# Patient Record
Sex: Female | Born: 1945 | Race: Black or African American | Hispanic: No | Smoking: Never smoker
Health system: Southern US, Community
[De-identification: ages and names within clinical notes are randomized; demographics above are authoritative.]

## PROBLEM LIST (undated history)

## (undated) DIAGNOSIS — I10 Essential (primary) hypertension: Secondary | ICD-10-CM

## (undated) DIAGNOSIS — G709 Myoneural disorder, unspecified: Secondary | ICD-10-CM

## (undated) DIAGNOSIS — I639 Cerebral infarction, unspecified: Secondary | ICD-10-CM

## (undated) DIAGNOSIS — E785 Hyperlipidemia, unspecified: Secondary | ICD-10-CM

## (undated) DIAGNOSIS — Z5189 Encounter for other specified aftercare: Secondary | ICD-10-CM

## (undated) DIAGNOSIS — E119 Type 2 diabetes mellitus without complications: Secondary | ICD-10-CM

## (undated) DIAGNOSIS — R269 Unspecified abnormalities of gait and mobility: Secondary | ICD-10-CM

## (undated) HISTORY — DX: Hyperlipidemia, unspecified: E78.5

## (undated) HISTORY — PX: ABDOMINAL HYSTERECTOMY: SHX81

## (undated) HISTORY — DX: Unspecified abnormalities of gait and mobility: R26.9

## (undated) HISTORY — DX: Type 2 diabetes mellitus without complications: E11.9

## (undated) HISTORY — DX: Essential (primary) hypertension: I10

## (undated) HISTORY — DX: Cerebral infarction, unspecified: I63.9

## (undated) HISTORY — DX: Encounter for other specified aftercare: Z51.89

## (undated) HISTORY — PX: OTHER SURGICAL HISTORY: SHX169

## (undated) HISTORY — DX: Myoneural disorder, unspecified: G70.9

---

## 2016-02-01 DIAGNOSIS — I10 Essential (primary) hypertension: Secondary | ICD-10-CM | POA: Diagnosis not present

## 2016-02-24 DIAGNOSIS — I10 Essential (primary) hypertension: Secondary | ICD-10-CM | POA: Diagnosis not present

## 2016-04-18 DIAGNOSIS — Z79899 Other long term (current) drug therapy: Secondary | ICD-10-CM | POA: Diagnosis not present

## 2016-04-18 DIAGNOSIS — R269 Unspecified abnormalities of gait and mobility: Secondary | ICD-10-CM | POA: Diagnosis not present

## 2016-04-18 DIAGNOSIS — R479 Unspecified speech disturbances: Secondary | ICD-10-CM | POA: Diagnosis not present

## 2016-04-21 DIAGNOSIS — G9389 Other specified disorders of brain: Secondary | ICD-10-CM | POA: Diagnosis not present

## 2016-04-25 DIAGNOSIS — I10 Essential (primary) hypertension: Secondary | ICD-10-CM | POA: Diagnosis not present

## 2016-04-25 DIAGNOSIS — R7309 Other abnormal glucose: Secondary | ICD-10-CM | POA: Diagnosis not present

## 2016-04-25 DIAGNOSIS — R269 Unspecified abnormalities of gait and mobility: Secondary | ICD-10-CM | POA: Diagnosis not present

## 2016-06-22 DIAGNOSIS — M5135 Other intervertebral disc degeneration, thoracolumbar region: Secondary | ICD-10-CM | POA: Diagnosis not present

## 2016-06-22 DIAGNOSIS — R9082 White matter disease, unspecified: Secondary | ICD-10-CM | POA: Diagnosis not present

## 2016-06-22 DIAGNOSIS — M5031 Other cervical disc degeneration,  high cervical region: Secondary | ICD-10-CM | POA: Diagnosis not present

## 2016-06-22 DIAGNOSIS — I10 Essential (primary) hypertension: Secondary | ICD-10-CM | POA: Diagnosis not present

## 2016-06-22 DIAGNOSIS — M542 Cervicalgia: Secondary | ICD-10-CM | POA: Diagnosis not present

## 2016-06-22 DIAGNOSIS — M47819 Spondylosis without myelopathy or radiculopathy, site unspecified: Secondary | ICD-10-CM | POA: Diagnosis not present

## 2017-03-31 ENCOUNTER — Encounter (HOSPITAL_COMMUNITY): Payer: Self-pay | Admitting: Emergency Medicine

## 2017-03-31 ENCOUNTER — Ambulatory Visit (HOSPITAL_COMMUNITY)
Admission: EM | Admit: 2017-03-31 | Discharge: 2017-03-31 | Disposition: A | Discharge status: Home / Self Care | Payer: Medicare Other | Attending: Internal Medicine | Admitting: Internal Medicine

## 2017-03-31 DIAGNOSIS — R5381 Other malaise: Secondary | ICD-10-CM | POA: Diagnosis not present

## 2017-03-31 DIAGNOSIS — I10 Essential (primary) hypertension: Secondary | ICD-10-CM | POA: Diagnosis not present

## 2017-03-31 DIAGNOSIS — R0789 Other chest pain: Secondary | ICD-10-CM | POA: Diagnosis not present

## 2017-03-31 DIAGNOSIS — R5383 Other fatigue: Secondary | ICD-10-CM | POA: Diagnosis not present

## 2017-03-31 LAB — POCT I-STAT, CHEM 8
BUN: 22 mg/dL — AB (ref 6–20)
CREATININE: 1 mg/dL (ref 0.44–1.00)
Calcium, Ion: 1.21 mmol/L (ref 1.15–1.40)
Chloride: 104 mmol/L (ref 101–111)
GLUCOSE: 132 mg/dL — AB (ref 65–99)
HEMATOCRIT: 43 % (ref 36.0–46.0)
Hemoglobin: 14.6 g/dL (ref 12.0–15.0)
Potassium: 3.7 mmol/L (ref 3.5–5.1)
Sodium: 140 mmol/L (ref 135–145)
TCO2: 29 mmol/L (ref 0–100)

## 2017-03-31 LAB — POCT URINALYSIS DIP (DEVICE)
Bilirubin Urine: NEGATIVE
GLUCOSE, UA: NEGATIVE mg/dL
HGB URINE DIPSTICK: NEGATIVE
KETONES UR: NEGATIVE mg/dL
Leukocytes, UA: NEGATIVE
Nitrite: NEGATIVE
PROTEIN: NEGATIVE mg/dL
SPECIFIC GRAVITY, URINE: 1.025 (ref 1.005–1.030)
Urobilinogen, UA: 0.2 mg/dL (ref 0.0–1.0)
pH: 6.5 (ref 5.0–8.0)

## 2017-03-31 NOTE — Discharge Instructions (Signed)
You have very high uncontrolled blood pressure which can cause damage to the heart, kidney and eyes. Recommend that you consider medications to help lower your pressure and prevent damage. This could be a factor in your weakness, but without additional symptoms it is unclear. Your urine, blood work and EKG here are stable, but remember could only perform very limited studies. If you begin to have worsening symptoms or emergent needs then please go to the ED.

## 2017-03-31 NOTE — ED Triage Notes (Signed)
Pt c/o feeling weak onset 1 week associated w/fatigue  Believes sx might be from a vaccination she rec'd 1 year ago for yellow fever  Denies fevers, chills  A&O x4... NAD... Ambulatory

## 2017-03-31 NOTE — ED Provider Notes (Signed)
CSN: [id number]     Arrival date & time 03/31/17  1337 History   None    Chief Complaint  Patient presents with  . Weakness  . Hypertension   (Consider location/radiation/quality/duration/timing/severity/associated sxs/prior Treatment)  70 yo female with a known history of HTN and non-compliance with medications presents with generalized weakness. This has been present for a week now. She reports a year of weakness but worse over the last week. She does not follow with a internist on a regular basis and is trying to find one. She states that she does not have energy to do anything at home. See full ROS for details.       History reviewed. No pertinent past medical history. History reviewed. No pertinent surgical history. History reviewed. No pertinent family history. Social History  Substance Use Topics  . Smoking status: Never Smoker  . Smokeless tobacco: Never Used  . Alcohol use No   OB History    No data available     Review of Systems  Constitutional: Positive for appetite change and fatigue. Negative for fever.  Respiratory: Negative for apnea, cough, shortness of breath and wheezing.   Cardiovascular: Positive for chest pain. Negative for palpitations and leg swelling.  Gastrointestinal: Negative.   Genitourinary: Negative.  Negative for dysuria and frequency.  Musculoskeletal: Negative.  Negative for back pain.  Neurological: Negative.   Psychiatric/Behavioral: Negative.  Negative for self-injury and sleep disturbance.    Allergies  Patient has no known allergies.  Home Medications   Prior to Admission medications   Not on File   Meds Ordered and Administered this Visit  Medications - No data to display  BP (!) 186/96 (BP Location: Left Arm)   Pulse 86   Temp 98.6 F (37 C) (Oral)   Resp 20   SpO2 99%  No data found.   Physical Exam  Constitutional: She is oriented to person, place, and time. She appears well-developed and well-nourished.  HENT:   Mouth/Throat: Oropharynx is clear and moist.  Neck: Normal range of motion.  No audible bruits  Cardiovascular: Normal rate and regular rhythm.   Pulmonary/Chest: Effort normal and breath sounds normal. No respiratory distress. She has no wheezes. She exhibits no tenderness.  Abdominal: Soft. Bowel sounds are normal. She exhibits no distension. There is no guarding.  Lymphadenopathy:    She has no cervical adenopathy.  Neurological: She is alert and oriented to person, place, and time. Coordination normal.  Skin: Skin is warm and dry. No rash noted.  Psychiatric: Her behavior is normal.  Nursing note and vitals reviewed.   Urgent Care Course     Procedures (including critical care time)  Labs Review Labs Reviewed  POCT I-STAT, CHEM 8 - Abnormal; Notable for the following:       Result Value   BUN 22 (*)    Glucose, Bld 132 (*)    All other components within normal limits  POCT URINALYSIS DIP (DEVICE)    Imaging Review No results found.   Visual Acuity Review  Right Eye Distance:   Left Eye Distance:   Bilateral Distance:    Right Eye Near:   Left Eye Near:    Bilateral Near:         MDM   1. Essential hypertension   2. Malaise and fatigue   71 yo with uncontrolled HTN and non-compliance to medical adherence presents with fatigue. She is otherwise asymptomatic. Baseline exam is normal. Urine and I-stat unremarkable and EKG with  possible high voltage and LVH but no emergent findings. Discussed consequences of uncontrolled HTN and she refuses to take any type of medications at this time. No emergent findings today other than elevated BP, will discharge. She is aware that her BP is elevated and could result in symptoms. If this becomes the case she is instructed to go to the ED for further management. She and her daughter express understanding.     Bjorn Pippin, [place] 03/31/17 1702

## 2017-07-17 ENCOUNTER — Encounter (HOSPITAL_COMMUNITY): Payer: Self-pay | Admitting: Emergency Medicine

## 2017-07-17 ENCOUNTER — Ambulatory Visit (HOSPITAL_COMMUNITY)
Admission: EM | Admit: 2017-07-17 | Discharge: 2017-07-17 | Disposition: A | Discharge status: Home / Self Care | Payer: Medicare Other | Attending: Family Medicine | Admitting: Family Medicine

## 2017-07-17 DIAGNOSIS — R531 Weakness: Secondary | ICD-10-CM

## 2017-07-17 DIAGNOSIS — R32 Unspecified urinary incontinence: Secondary | ICD-10-CM

## 2017-07-17 LAB — POCT URINALYSIS DIP (DEVICE)
BILIRUBIN URINE: NEGATIVE
Glucose, UA: NEGATIVE mg/dL
HGB URINE DIPSTICK: NEGATIVE
Ketones, ur: NEGATIVE mg/dL
LEUKOCYTES UA: NEGATIVE
Nitrite: NEGATIVE
Protein, ur: NEGATIVE mg/dL
Urobilinogen, UA: 0.2 mg/dL (ref 0.0–1.0)
pH: 5.5 (ref 5.0–8.0)

## 2017-07-17 LAB — POCT I-STAT, CHEM 8
BUN: 20 mg/dL (ref 6–20)
CREATININE: 0.9 mg/dL (ref 0.44–1.00)
Calcium, Ion: 1.17 mmol/L (ref 1.15–1.40)
Chloride: 102 mmol/L (ref 101–111)
GLUCOSE: 156 mg/dL — AB (ref 65–99)
HEMATOCRIT: 43 % (ref 36.0–46.0)
Hemoglobin: 14.6 g/dL (ref 12.0–15.0)
Potassium: 3.3 mmol/L — ABNORMAL LOW (ref 3.5–5.1)
Sodium: 141 mmol/L (ref 135–145)
TCO2: 27 mmol/L (ref 22–32)

## 2017-07-17 NOTE — ED Triage Notes (Signed)
Weakness started 2 days ago.  Intermittent urinary incontinence.  2 episodes of voiding on self, two times during the night

## 2017-07-17 NOTE — Discharge Instructions (Signed)
Your urine was negative for UTI. Blood work negative for electrolyte imbalance. EKG shows chronic changes that could be due to continued hight blood pressure. Your vitals showed that dehydration could be contributing to your symptoms. Keep hydrated, you urine should be clear to pale yellow in color. Follow up with PCP for further workup and evaluation needed for weakness and high blood pressure. If experiencing chest pain, shortness of breathing, one sided weakness, headache, blurry vision, go to the emergency department for further evaluation.

## 2017-07-17 NOTE — ED Provider Notes (Signed)
[facility]    CSN: [id number] Arrival date & time: 07/17/17  1654     History   Chief Complaint Chief Complaint  Patient presents with  . Weakness    HPI Sheila Perez is a 71 y.o. female.   71 year old female comes in for 2 day history of weakness. She denies dizziness, fatigue, syncope. Describes weakness as feeling "more imbalanced." She does state feeling is worse when she first stands up. She has had 2 episodes of incontinence, mainly due to slow gait and  unable to reach commode  and time. Denies abdominal pain, nausea, vomiting, diarrhea, constipation. Denies URI symptoms such as fever, chills, night sweats, cough, congestion, ear pain, eye pain. She has history of hypertension and noncompliance with treatment. States she was seen by PCP recently, who recommended neurology referral for possible MS. Patient states she declined referral as she does not agree with diagnosis. She denies chest pain, shortness of breath, wheezing, orthopnea, leg swelling. She was here for a month ago with similar symptoms, at that time workup was negative except for hypertension, which patient declined treatment at that time as well.      History reviewed. No pertinent past medical history.  There are no active problems to display for this patient.   Past Surgical History:  Procedure Laterality Date  . ABDOMINAL HYSTERECTOMY    . maxilo facial surgery      OB History    No data available       Home Medications    Prior to Admission medications   Not on File    Family History No family history on file.  Social History Social History  Substance Use Topics  . Smoking status: Never Smoker  . Smokeless tobacco: Never Used  . Alcohol use No     Allergies   Patient has no known allergies.   Review of Systems Review of Systems  Reason unable to perform ROS: See HPI as above.     Physical Exam Triage Vital Signs ED Triage Vitals [07/17/17 1759]  Enc  Vitals Group     BP (!) 184/99     Pulse Rate 89     Resp 18     Temp 98.9 F (37.2 C)     Temp Source Oral     SpO2 100 %     Weight      Height      Head Circumference      Peak Flow      Pain Score      Pain Loc      Pain Edu?      Excl. in [place]?    Orthostatic VS for the past 24 hrs:  BP- Lying Pulse- Lying BP- Sitting Pulse- Sitting BP- Standing at 0 minutes Pulse- Standing at 0 minutes  07/17/17 1842 177/90 78 (!) 179/112 83 (!) 192/102 88    Updated Vital Signs BP (!) 184/99 (BP Location: Left Arm)   Pulse 89   Temp 98.9 F (37.2 C) (Oral)   Resp 18   SpO2 100%   Physical Exam  Constitutional: She is oriented to person, place, and time. She appears well-developed and well-nourished. No distress.  HENT:  Head: Normocephalic and atraumatic.  Right Ear: Tympanic membrane, external ear and ear canal normal. Tympanic membrane is not erythematous and not bulging.  Left Ear: Tympanic membrane, external ear and ear canal normal. Tympanic membrane is not erythematous and not bulging.  Nose: Nose normal. Right sinus exhibits no  maxillary sinus tenderness and no frontal sinus tenderness. Left sinus exhibits no maxillary sinus tenderness and no frontal sinus tenderness.  Mouth/Throat: Uvula is midline, oropharynx is clear and moist and mucous membranes are normal.  Eyes: Pupils are equal, round, and reactive to light. Conjunctivae are normal.  Neck: Normal range of motion. Neck supple.  Cardiovascular: Normal rate, regular rhythm and normal heart sounds.  Exam reveals no gallop and no friction rub.   No murmur heard. Pulmonary/Chest: Effort normal and breath sounds normal. She has no decreased breath sounds. She has no wheezes. She has no rhonchi. She has no rales.  Lymphadenopathy:    She has no cervical adenopathy.  Neurological: She is alert and oriented to person, place, and time. She has normal strength. No cranial nerve deficit or sensory deficit. She displays a negative  Romberg sign.  Patient with drooping of left lip during smiling, which both patient and daughter states is baseline after facial surgery in the past.   Skin: Skin is warm and dry.  Psychiatric: She has a normal mood and affect. Her behavior is normal. Judgment normal.     UC Treatments / Results  Labs (all labs ordered are listed, but only abnormal results are displayed) Labs Reviewed  POCT I-STAT, CHEM 8 - Abnormal; Notable for the following:       Result Value   Potassium 3.3 (*)    Glucose, Bld 156 (*)    All other components within normal limits  POCT URINALYSIS DIP (DEVICE)    EKG  EKG Interpretation None       Radiology No results found.  Procedures Procedures (including critical care time)  Medications Ordered in UC Medications - No data to display   Initial Impression / Assessment and Plan / UC Course  I have reviewed the triage vital signs and the nursing notes.  Pertinent labs & imaging results that were available during my care of the patient were reviewed by me and considered in my medical decision making (see chart for details).    Discussed workup results with patient. Urine negative for UTI. I-STAT without significant electrolyte imbalance, anemia. EKG was normal sinus, possible LVH, consistent with EKG done 4 months ago. Orthostatic showed possible dehydration. Push fluids. Patient without shortness of breath, chest pain, dizziness, headache, blurry vision at this time. Again discussed the importance of hypertension control, patient to follow up with PCP for further evaluation and treatment needed. Return precautions given.  Case discussed with Dr Mannie Stabile, who agrees to plan.   Final Clinical Impressions(s) / UC Diagnoses   Final diagnoses:  Weakness    New Prescriptions There are no discharge medications for this patient.      Ok Edwards, PA-C 07/17/17 [phone]

## 2019-12-05 ENCOUNTER — Encounter: Payer: Self-pay | Admitting: Family Medicine

## 2019-12-05 ENCOUNTER — Ambulatory Visit (INDEPENDENT_AMBULATORY_CARE_PROVIDER_SITE_OTHER): Payer: Medicare HMO | Admitting: Family Medicine

## 2019-12-05 ENCOUNTER — Other Ambulatory Visit: Payer: Self-pay

## 2019-12-05 VITALS — BP 130/80 | HR 91 | Temp 96.2°F | Resp 12 | Ht 63.0 in | Wt 199.0 lb

## 2019-12-05 DIAGNOSIS — Z780 Asymptomatic menopausal state: Secondary | ICD-10-CM

## 2019-12-05 DIAGNOSIS — I10 Essential (primary) hypertension: Secondary | ICD-10-CM

## 2019-12-05 DIAGNOSIS — R739 Hyperglycemia, unspecified: Secondary | ICD-10-CM | POA: Diagnosis not present

## 2019-12-05 DIAGNOSIS — K625 Hemorrhage of anus and rectum: Secondary | ICD-10-CM | POA: Diagnosis not present

## 2019-12-05 DIAGNOSIS — Z1159 Encounter for screening for other viral diseases: Secondary | ICD-10-CM | POA: Diagnosis not present

## 2019-12-05 DIAGNOSIS — Z1231 Encounter for screening mammogram for malignant neoplasm of breast: Secondary | ICD-10-CM

## 2019-12-05 DIAGNOSIS — Z9189 Other specified personal risk factors, not elsewhere classified: Secondary | ICD-10-CM

## 2019-12-05 LAB — BASIC METABOLIC PANEL
BUN: 14 mg/dL (ref 6–23)
CO2: 27 mEq/L (ref 19–32)
Calcium: 8.8 mg/dL (ref 8.4–10.5)
Chloride: 105 mEq/L (ref 96–112)
Creatinine, Ser: 0.91 mg/dL (ref 0.40–1.20)
GFR: 73.71 mL/min (ref >60.00)
Glucose, Bld: 96 mg/dL (ref 70–99)
Potassium: 3.9 mEq/L (ref 3.5–5.1)
Sodium: 141 mEq/L (ref 135–145)

## 2019-12-05 LAB — CBC
HCT: 44.3 % (ref 36.0–46.0)
Hemoglobin: 14.5 g/dL (ref 12.0–15.0)
MCHC: 32.7 g/dL (ref 30.0–36.0)
MCV: 93.7 fl (ref 78.0–100.0)
Platelets: 211 10*3/uL (ref 150.0–400.0)
RBC: 4.73 Mil/uL (ref 3.87–5.11)
RDW: 14.8 % (ref 11.5–15.5)
WBC: 6 10*3/uL (ref 4.0–10.5)

## 2019-12-05 LAB — HEMOGLOBIN A1C: Hgb A1c MFr Bld: 5.9 % (ref 4.6–6.5)

## 2019-12-05 NOTE — Patient Instructions (Addendum)
A few things to remember from today's visit:   Elevated blood sugar - Plan: Hemoglobin A1c  Hypertension, essential, benign - Plan: Basic metabolic panel  Gastrointestinal hemorrhage associated with anorectal source - Plan: CBC  Encounter for HCV screening test for high risk patient - Plan: Hepatitis C antibody  If rectal bleed again we really need to have another discussion about colonoscopy. Let me know if you change you mind.  Please be sure medication list is accurate. If a new problem present, please set up appointment sooner than planned today.

## 2019-12-05 NOTE — Progress Notes (Signed)
HPI:   [name] is a 74 y.o. female, who is here today with her daughter to establish care.  Former PCP: N/A Last preventive routine visit: Many years ago,early 2000.  Chronic medical problems: HTN.  She also reports hx of blood after defecation,on tissue,last seen 07/2019. No dyschezia. + Straining and constipation. She thinks it is caused by hemorrhoids. FHx negative for colon cancer.  Colonoscopy early 2000. She is not interested in having more colonoscopy.  Denies abdominal pain, nausea, vomiting, changes in bowel habits, or melena.  HTN: She is on non pharmacologic treatment. She was on Atacand HCT 32-25 mg in 2017. She is not checking BP at home. Denies severe/frequent headache, visual changes, chest pain, dyspnea, palpitation, focal weakness, or edema.  Noted unstable and antalgic gait. Hx of back pain and arthralgias. She doe snot have a cane or walker. No falls in the past year.  She lives with her daughter. She does not drive frequently,still has her driving license.  In general she follows a healthful diet but does not exercise regularly.  She has had elevated glucose at 156. No hx of DM. Denies polydipsia,polyuria, or polyphagia.   Review of Systems  Constitutional: Negative for activity change, appetite change, fatigue, fever and unexpected weight change.  HENT: Negative for mouth sores, nosebleeds and sore throat.   Respiratory: Negative for cough and wheezing.   Gastrointestinal: Negative for abdominal distention.  Genitourinary: Negative for decreased urine volume, dysuria and hematuria.  Musculoskeletal: Positive for arthralgias and back pain.  Neurological: Negative for syncope and facial asymmetry.  Psychiatric/Behavioral: Negative for confusion. The patient is not nervous/anxious.   Rest see pertinent positives and negatives per HPI.   No current outpatient medications on file prior to visit.   No current  facility-administered medications on file prior to visit.    Past Medical History:  Diagnosis Date  . Hypertension    No Known Allergies  Family History  Problem Relation Age of Onset  . Kidney disease Mother   . Diabetes Mother   . Alcohol abuse Father   . Diabetes Sister   . Diabetes Daughter   . Diabetes Sister     Social History   Socioeconomic History  . Marital status: Single    Spouse name: Not on file  . Number of children: Not on file  . Years of education: Not on file  . Highest education level: Not on file  Occupational History  . Not on file  Tobacco Use  . Smoking status: Never Smoker  . Smokeless tobacco: Never Used  Substance and Sexual Activity  . Alcohol use: No  . Drug use: No  . Sexual activity: Not Currently  Other Topics Concern  . Not on file  Social History Narrative  . Not on file   Social Determinants of Health   Financial Resource Strain:   . Difficulty of Paying Living Expenses: Not on file  Food Insecurity:   . Worried About Charity fundraiser in the Last Year: Not on file  . Ran Out of Food in the Last Year: Not on file  Transportation Needs:   . Lack of Transportation (Medical): Not on file  . Lack of Transportation (Non-Medical): Not on file  Physical Activity:   . Days of Exercise per Week: Not on file  . Minutes of Exercise per Session: Not on file  Stress:   . Feeling of Stress : Not on file  Social Connections:   .  Frequency of Communication with Friends and Family: Not on file  . Frequency of Social Gatherings with Friends and Family: Not on file  . Attends Religious Services: Not on file  . Active Member of Clubs or Organizations: Not on file  . Attends Archivist Meetings: Not on file  . Marital Status: Not on file    Vitals:   12/05/19 0913  BP: 130/80  Pulse: 91  Resp: 12  Temp: (!) 96.2 F (35.7 C)  SpO2: 97%    There is no height or weight on file to calculate BMI.  Physical Exam    Nursing note and vitals reviewed. Constitutional: She is oriented to person, place, and time. She appears well-developed. No distress.  HENT:  Head: Normocephalic and atraumatic.  Mouth/Throat: Oropharynx is clear and moist and mucous membranes are normal.  Eyes: Pupils are equal, round, and reactive to light. Conjunctivae are normal.  Cardiovascular: Normal rate and regular rhythm.  No murmur heard. Pulses:      Dorsalis pedis pulses are 2+ on the right side and 2+ on the left side.  Respiratory: Effort normal and breath sounds normal. No respiratory distress.  GI: Soft. She exhibits no mass. There is no hepatomegaly. There is no abdominal tenderness.  Musculoskeletal:        General: No edema.  Lymphadenopathy:    She has no cervical adenopathy.  Neurological: She is alert and oriented to person, place, and time. She has normal strength. No cranial nerve deficit. Gait abnormal.  Unstable,antalgic gait, not assisted.  Skin: Skin is warm. No rash noted. No erythema.  Psychiatric: She has a normal mood and affect.  Well groomed, good eye contact.   ASSESSMENT AND PLAN:  [name] was seen today for establish care.  Diagnoses and all orders for this visit: Lab Results  Component Value Date   HGBA1C 5.9 12/05/2019   Lab Results  Component Value Date   WBC 6.0 12/05/2019   HGB 14.5 12/05/2019   HCT 44.3 12/05/2019   MCV 93.7 12/05/2019   PLT 211.0 12/05/2019   Lab Results  Component Value Date   CREATININE 0.91 12/05/2019   BUN 14 12/05/2019   NA 141 12/05/2019   K 3.9 12/05/2019   CL 105 12/05/2019   CO2 27 12/05/2019    Elevated blood sugar Wt loss thought a healthy life style for primary prevention recommended. Further recommendations according to A1C result.  -     Hemoglobin A1c  Hypertension, essential, benign Today BP is adequate. For now continue non pharmacologic treatment. Low salt diet. Eye exam at least once per year. Recommend monitoring BP  regularly.  -     Basic metabolic panel  Gastrointestinal hemorrhage associated with anorectal source She does not want colonoscopy. She understands possible etiologies including colon ca. She will consider it and let me know.  -     CBC  Encounter for HCV screening test for high risk patient -     Hepatitis C antibody  Encounter for screening mammogram for malignant neoplasm of breast -     MM 3D SCREEN BREAST BILATERAL; Future  Asymptomatic postmenopausal estrogen deficiency -     DG Bone Density; Future    Return in about 3 months (around 03/04/2020) for Medicare.     Sheila Tramel G. [name], MD  [facility]. [place] office.

## 2019-12-06 ENCOUNTER — Encounter: Payer: Self-pay | Admitting: Family Medicine

## 2019-12-08 LAB — HEPATITIS C ANTIBODY
Hepatitis C Ab: NONREACTIVE
SIGNAL TO CUT-OFF: 0.02 (ref <1.00)

## 2020-01-09 ENCOUNTER — Telehealth: Payer: Medicare HMO | Admitting: Family Medicine

## 2020-01-09 ENCOUNTER — Other Ambulatory Visit: Payer: Self-pay

## 2020-01-12 ENCOUNTER — Telehealth: Payer: Medicare HMO | Admitting: Family Medicine

## 2020-01-12 ENCOUNTER — Encounter: Payer: Self-pay | Admitting: Family Medicine

## 2020-01-26 ENCOUNTER — Emergency Department (HOSPITAL_COMMUNITY): Payer: Medicare HMO

## 2020-01-26 ENCOUNTER — Other Ambulatory Visit: Payer: Self-pay

## 2020-01-26 ENCOUNTER — Encounter (HOSPITAL_COMMUNITY): Payer: Self-pay | Admitting: Emergency Medicine

## 2020-01-26 ENCOUNTER — Emergency Department (HOSPITAL_COMMUNITY)
Admission: EM | Admit: 2020-01-26 | Discharge: 2020-01-26 | Disposition: A | Discharge status: Home / Self Care | Payer: Medicare HMO | Attending: Emergency Medicine | Admitting: Emergency Medicine

## 2020-01-26 DIAGNOSIS — G510 Bell's palsy: Secondary | ICD-10-CM | POA: Diagnosis not present

## 2020-01-26 DIAGNOSIS — Z79899 Other long term (current) drug therapy: Secondary | ICD-10-CM | POA: Diagnosis not present

## 2020-01-26 DIAGNOSIS — R4781 Slurred speech: Secondary | ICD-10-CM | POA: Diagnosis present

## 2020-01-26 LAB — PROTIME-INR
INR: 1.1 (ref 0.8–1.2)
Prothrombin Time: 14.4 seconds (ref 11.4–15.2)

## 2020-01-26 LAB — URINALYSIS, ROUTINE W REFLEX MICROSCOPIC
Bilirubin Urine: NEGATIVE
Glucose, UA: NEGATIVE mg/dL
Hgb urine dipstick: NEGATIVE
Ketones, ur: NEGATIVE mg/dL
Leukocytes,Ua: NEGATIVE
Nitrite: NEGATIVE
Protein, ur: NEGATIVE mg/dL
Specific Gravity, Urine: 1.01 (ref 1.005–1.030)
pH: 7 (ref 5.0–8.0)

## 2020-01-26 LAB — DIFFERENTIAL
Abs Immature Granulocytes: 0.02 10*3/uL (ref 0.00–0.07)
Basophils Absolute: 0 10*3/uL (ref 0.0–0.1)
Basophils Relative: 1 %
Eosinophils Absolute: 0.4 10*3/uL (ref 0.0–0.5)
Eosinophils Relative: 5 %
Immature Granulocytes: 0 %
Lymphocytes Relative: 30 %
Lymphs Abs: 2.3 10*3/uL (ref 0.7–4.0)
Monocytes Absolute: 0.6 10*3/uL (ref 0.1–1.0)
Monocytes Relative: 8 %
Neutro Abs: 4.3 10*3/uL (ref 1.7–7.7)
Neutrophils Relative %: 56 %

## 2020-01-26 LAB — COMPREHENSIVE METABOLIC PANEL
ALT: 20 U/L (ref 0–44)
AST: 19 U/L (ref 15–41)
Albumin: 3.4 g/dL — ABNORMAL LOW (ref 3.5–5.0)
Alkaline Phosphatase: 76 U/L (ref 38–126)
Anion gap: 12 (ref 5–15)
BUN: 16 mg/dL (ref 8–23)
CO2: 25 mmol/L (ref 22–32)
Calcium: 8.5 mg/dL — ABNORMAL LOW (ref 8.9–10.3)
Chloride: 102 mmol/L (ref 98–111)
Creatinine, Ser: 0.92 mg/dL (ref 0.44–1.00)
GFR calc Af Amer: >60 mL/min (ref >60)
GFR calc non Af Amer: >60 mL/min (ref >60)
Glucose, Bld: 112 mg/dL — ABNORMAL HIGH (ref 70–99)
Potassium: 3.8 mmol/L (ref 3.5–5.1)
Sodium: 139 mmol/L (ref 135–145)
Total Bilirubin: 0.9 mg/dL (ref 0.3–1.2)
Total Protein: 6.9 g/dL (ref 6.5–8.1)

## 2020-01-26 LAB — CBC
HCT: 42.9 % (ref 36.0–46.0)
Hemoglobin: 13.9 g/dL (ref 12.0–15.0)
MCH: 30.5 pg (ref 26.0–34.0)
MCHC: 32.4 g/dL (ref 30.0–36.0)
MCV: 94.3 fL (ref 80.0–100.0)
Platelets: 218 10*3/uL (ref 150–400)
RBC: 4.55 MIL/uL (ref 3.87–5.11)
RDW: 13.3 % (ref 11.5–15.5)
WBC: 7.7 10*3/uL (ref 4.0–10.5)
nRBC: 0 % (ref 0.0–0.2)

## 2020-01-26 LAB — I-STAT CHEM 8, ED
BUN: 18 mg/dL (ref 8–23)
Calcium, Ion: 1.06 mmol/L — ABNORMAL LOW (ref 1.15–1.40)
Chloride: 104 mmol/L (ref 98–111)
Creatinine, Ser: 0.8 mg/dL (ref 0.44–1.00)
Glucose, Bld: 110 mg/dL — ABNORMAL HIGH (ref 70–99)
HCT: 42 % (ref 36.0–46.0)
Hemoglobin: 14.3 g/dL (ref 12.0–15.0)
Potassium: 3.7 mmol/L (ref 3.5–5.1)
Sodium: 140 mmol/L (ref 135–145)
TCO2: 26 mmol/L (ref 22–32)

## 2020-01-26 LAB — RAPID URINE DRUG SCREEN, HOSP PERFORMED
Amphetamines: NOT DETECTED
Barbiturates: NOT DETECTED
Benzodiazepines: NOT DETECTED
Cocaine: NOT DETECTED
Opiates: NOT DETECTED
Tetrahydrocannabinol: NOT DETECTED

## 2020-01-26 LAB — APTT: aPTT: 35 seconds (ref 24–36)

## 2020-01-26 LAB — ETHANOL: Alcohol, Ethyl (B): <10 mg/dL (ref <10)

## 2020-01-26 IMAGING — MR MR HEAD W/O CM
13 of 14 series · 44 of 48 positions shown · non-contrast
Comparison: None.

CLINICAL DATA: Slurred speech starting at 8 a.m.

EXAM:
MRI HEAD WITHOUT CONTRAST
TECHNIQUE: Multiplanar, multiecho pulse sequences of the brain and surrounding
structures were obtained without intravenous contrast.

[Series 5: DWI · axial · 3.0mm · 0.88mm/px · z∈[-121,+8]mm · 7 of 90 slices shown (1 of 4)]
[im 1/90]
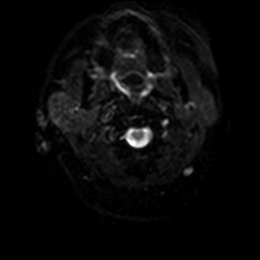
[im 15/90]
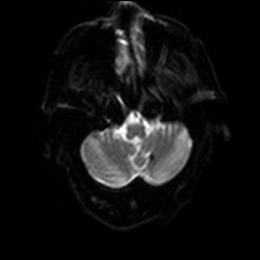
[im 30/90]
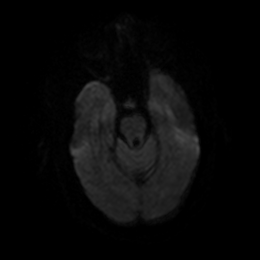
[im 45/90]
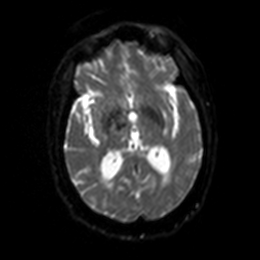
[im 60/90]
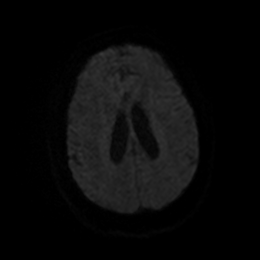
[im 75/90]
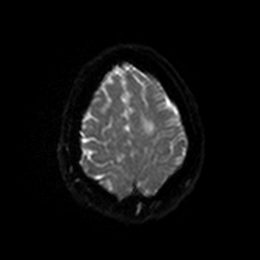
[im 90/90]
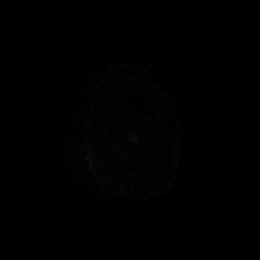

[Series 6: DWI · axial · 3.0mm · 0.88mm/px · z∈[-121,+8]mm · 3 of 45 slices shown (2 of 4)]
[im 1/45]
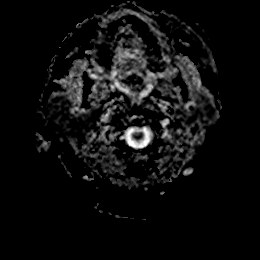
[im 23/45]
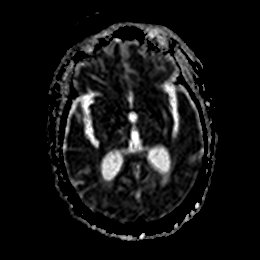
[im 45/45]
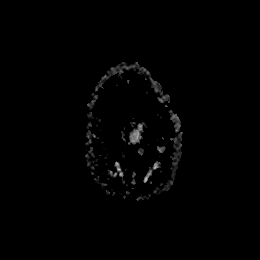

[Series 7: DWI · coronal · 4.0mm · 0.88mm/px · 5 of 64 slices shown (3 of 4)]
[im 1/64]
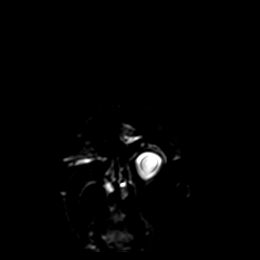
[im 16/64]
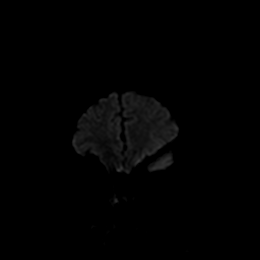
[im 32/64]
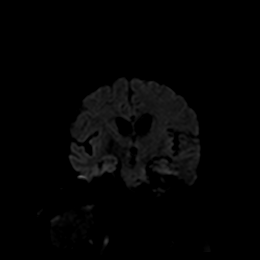
[im 48/64]
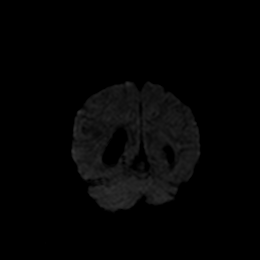
[im 64/64]
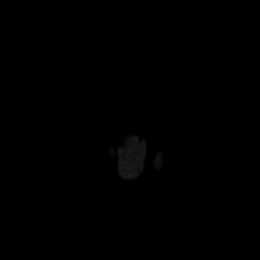

[Series 8: DWI · coronal · 4.0mm · 0.88mm/px · 2 of 32 slices shown (4 of 4)]
[im 1/32]
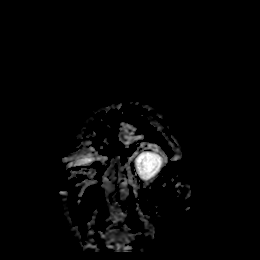
[im 32/32]
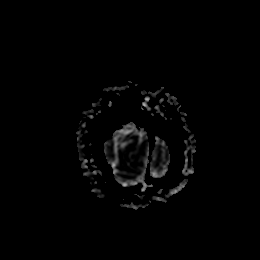

[Series 9: T1 · sagittal · 5.0mm · 0.75mm/px · 2 of 23 slices shown]
[im 1/23]
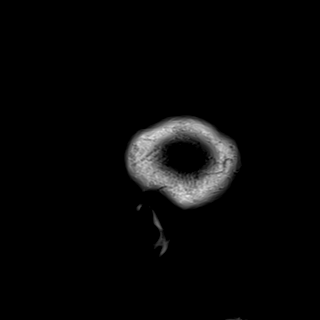
[im 23/23]
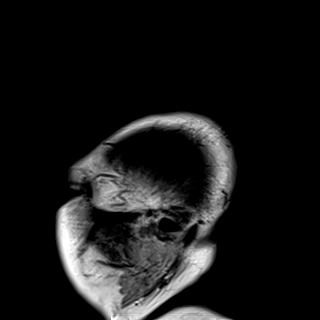

[Series 10: T2 · axial · 3.0mm · 0.72mm/px · z∈[-121,+8]mm · 3 of 45 slices shown (1 of 2)]
[im 1/45]
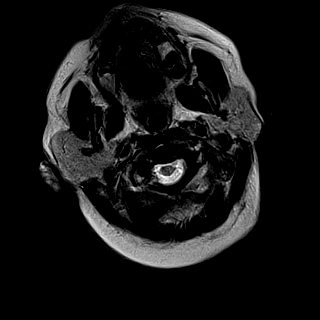
[im 23/45]
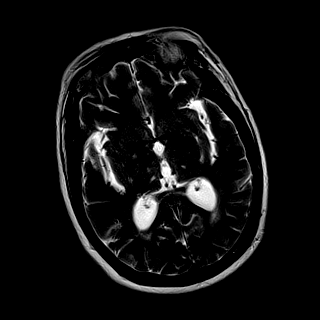
[im 45/45]
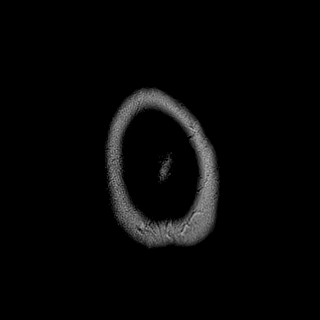

[Series 12: FLAIR · axial · 5.0mm · 0.90mm/px · z∈[-127,+14]mm · 2 of 25 slices shown (1 of 2)]
[im 1/25]
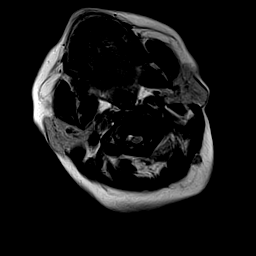
[im 25/25]
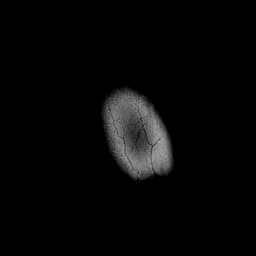

[Series 13: mag_images · axial · 3.0mm · 0.90mm/px · z∈[-147,+26]mm · 4 of 60 slices shown]
[im 1/60]
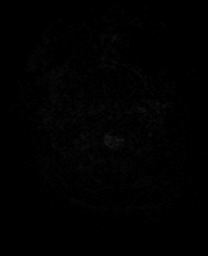
[im 20/60]
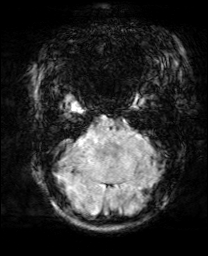
[im 40/60]
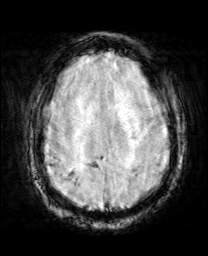
[im 60/60]
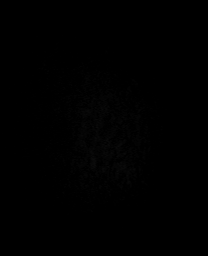

[Series 14: pha_images · axial · 3.0mm · 0.90mm/px · z∈[-147,+26]mm · 4 of 60 slices shown]
[im 1/60]
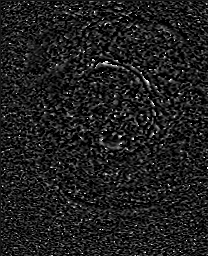
[im 20/60]
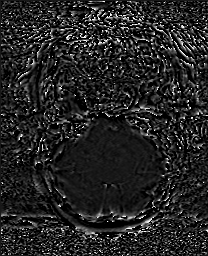
[im 40/60]
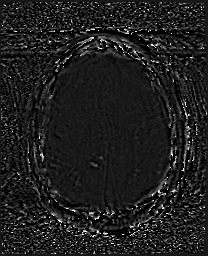
[im 60/60]
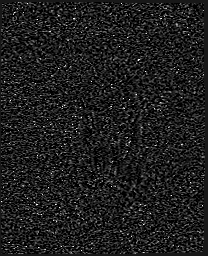

[Series 15: swi_images · axial · 3.0mm · 0.90mm/px · z∈[-147,+26]mm · 4 of 60 slices shown]
[im 1/60]
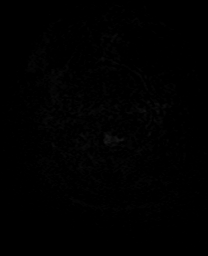
[im 20/60]
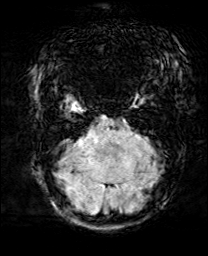
[im 40/60]
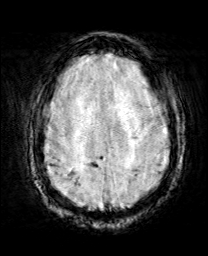
[im 60/60]
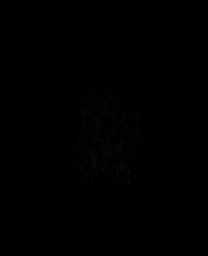

[Series 16: mip_images(sw) · axial · 24.0mm · 0.90mm/px · z∈[-137,+16]mm · 4 of 53 slices shown]
[im 1/53]
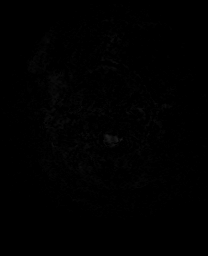
[im 18/53]
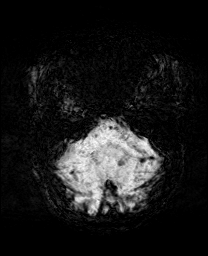
[im 35/53]
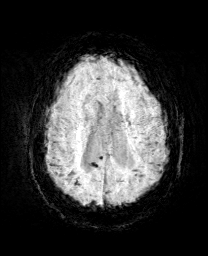
[im 53/53]
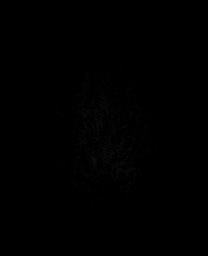

[Series 18: T2 · coronal · 5.0mm · 0.72mm/px · 2 of 28 slices shown (2 of 2)]
[im 1/28]
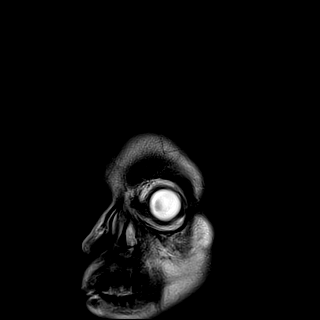
[im 28/28]
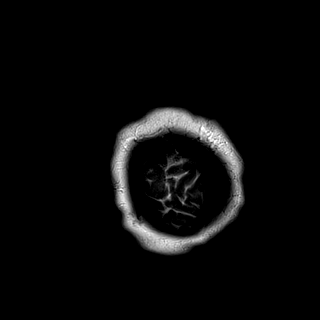

[Series 19: FLAIR · axial · 5.0mm · 0.90mm/px · z∈[-127,+14]mm · 2 of 25 slices shown (2 of 2)]
[im 1/25]
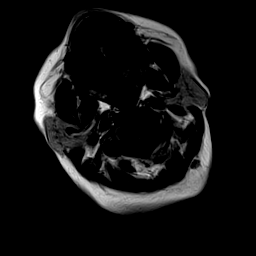
[im 25/25]
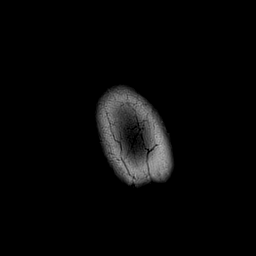

[44 of 48 positions shown; findings below may reference images not displayed]

FINDINGS: Brain: No acute infarct. Increased diffusion signal at the right
basal ganglia is at the level of prior infarction and hemosiderin
staining. Remote lacunar infarcts at the deep gray nuclei,
brainstem, and right middle cerebellar peduncle. Confluent ischemic
gliosis in the deep cerebral white matter. No acute hemorrhage.
Chronic blood products are seen along the bilateral posterior
cerebral cortex, which may be posttraumatic. Remote micro
hemorrhages at the basal ganglia and left cerebellum, likely from
small vessel disease. Cerebral volume is overall normal; there is
some atrophy of the brainstem which is likely post ischemic. No
masslike finding or extra-axial collection

Vascular: Preserved flow voids

Skull and upper cervical spine: No evidence of bone lesion

Sinuses/Orbits: No acute finding
IMPRESSION: 1. Motion degraded study without acute finding.
2. Advanced chronic small vessel disease.

## 2020-01-26 IMAGING — CT CT HEAD W/O CM
4 series · 15 of 47 positions shown, 17 images · non-contrast
Comparison: None.

CLINICAL DATA: Slurred speech, focal neurologic deficit.

EXAM:
CT HEAD WITHOUT CONTRAST
TECHNIQUE: Contiguous axial images were obtained from the base of the skull
through the vertex without intravenous contrast.

[Series 3: head wo · axial · 0.43mm/px · z∈[-150,-30]mm · 7 of 32 slices shown, 9 images]
[im 4/32  brain]
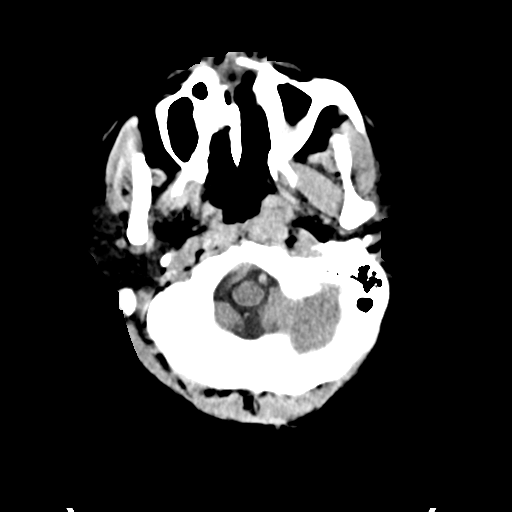
[im 4/32  bone]
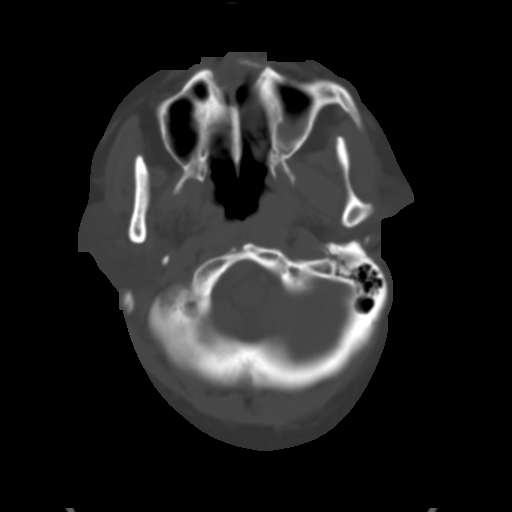
[im 8/32  brain]
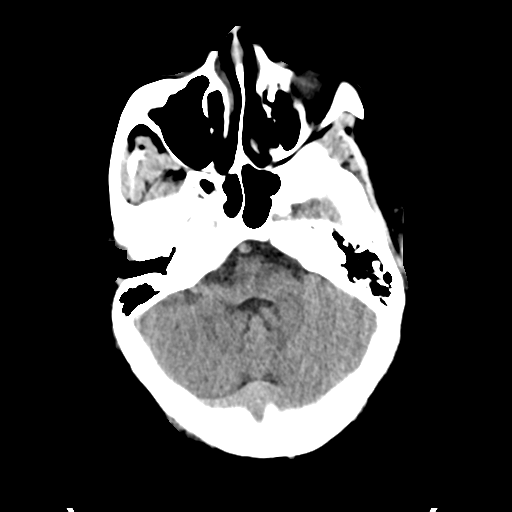
[im 12/32  brain]
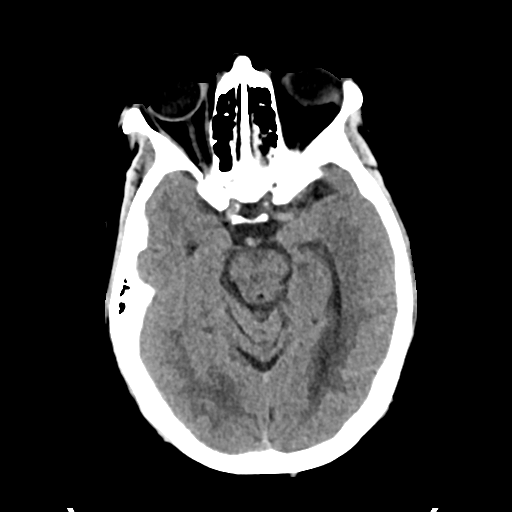
[im 16/32  brain]
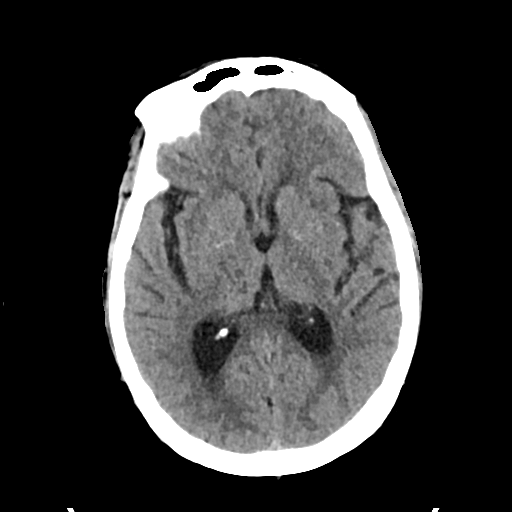
[im 20/32  brain]
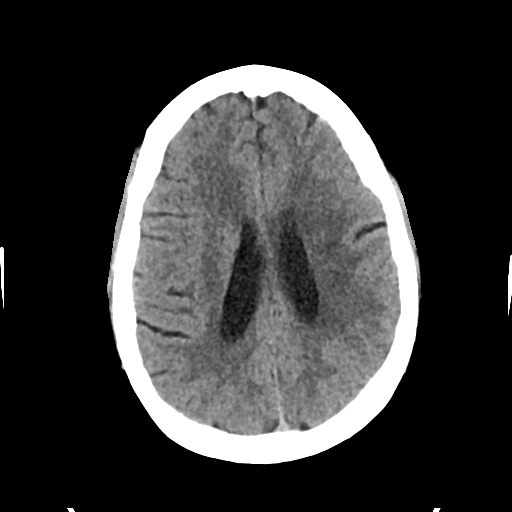
[im 20/32  bone]
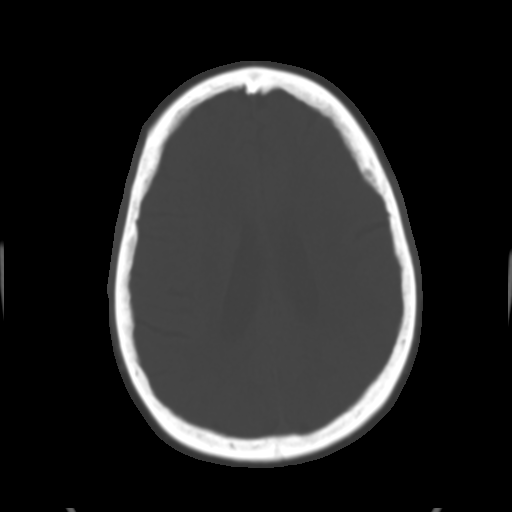
[im 24/32  brain]
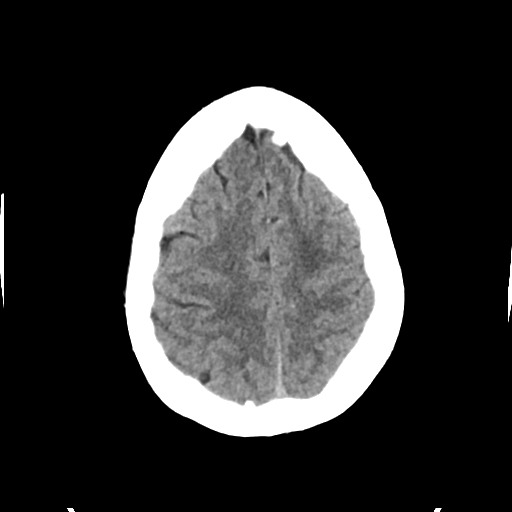
[im 28/32  brain]
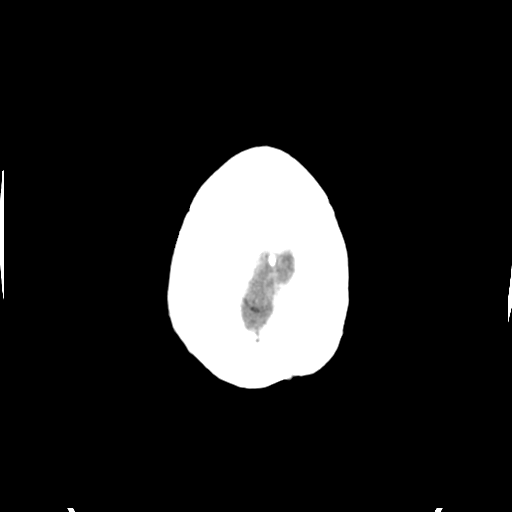

[Series 4: head bone · axial · 0.43mm/px · z∈[-151,-135]mm · 2 of 80 slices shown]
[im 8/80  bone]
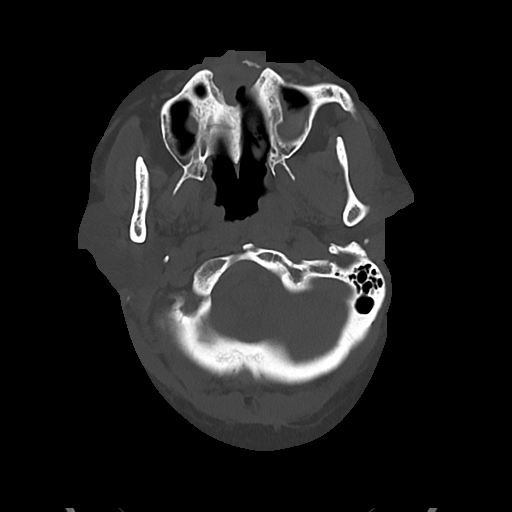
[im 16/80  bone]
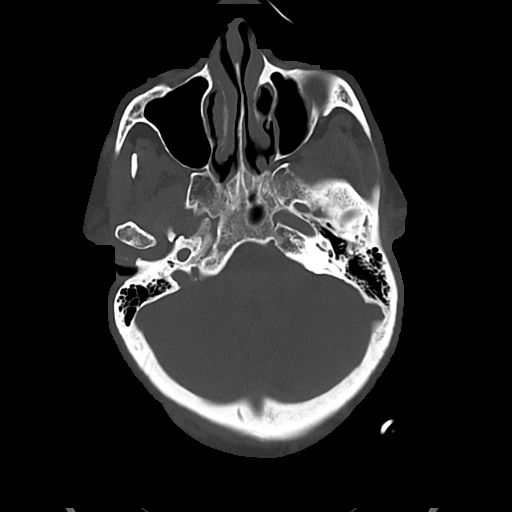

[Series 5: cor soft · coronal · 0.30mm/px · 3 of 69 slices shown]
[im 25/69  brain]
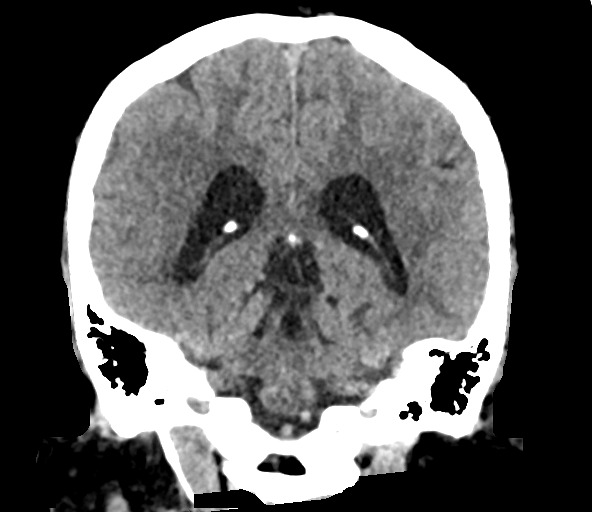
[im 31/69  brain]
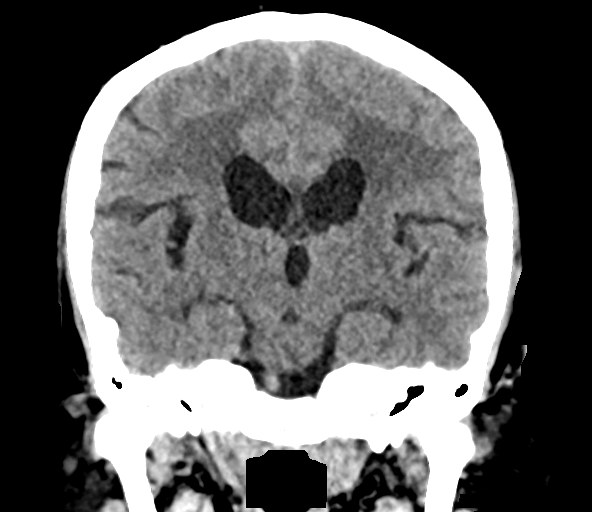
[im 38/69  brain]
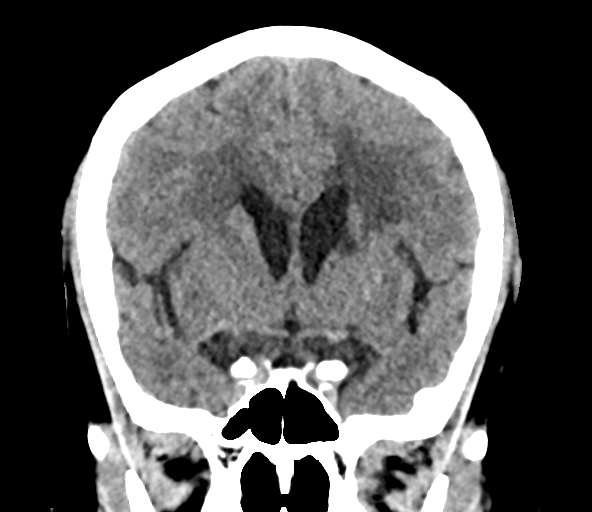

[Series 6: sag soft · sagittal · 0.33mm/px · 3 of 59 slices shown]
[im 20/59  brain]
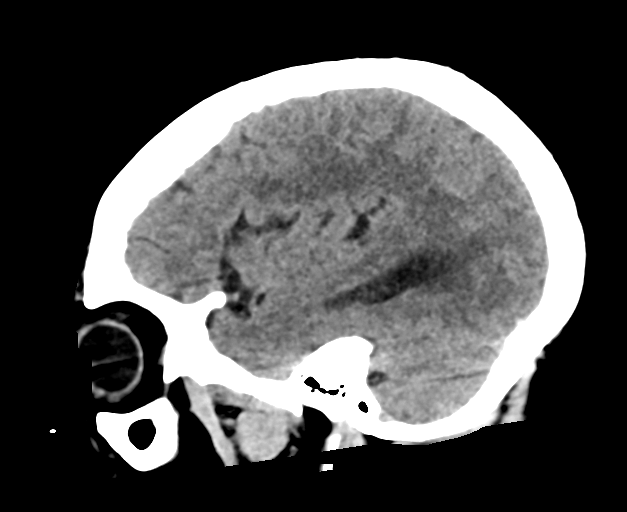
[im 30/59  brain]
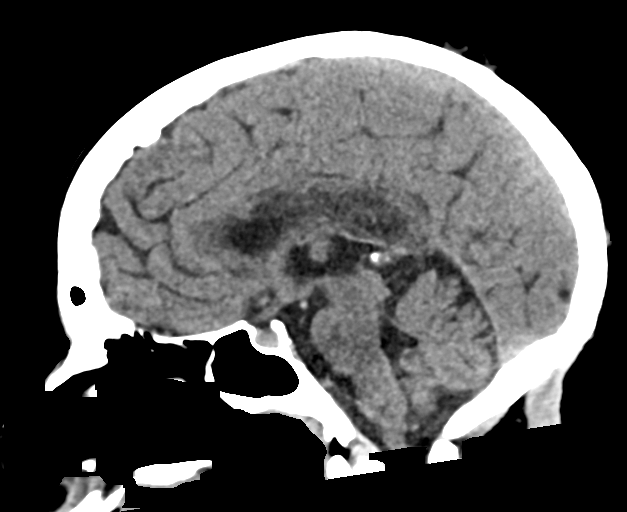
[im 39/59  brain]
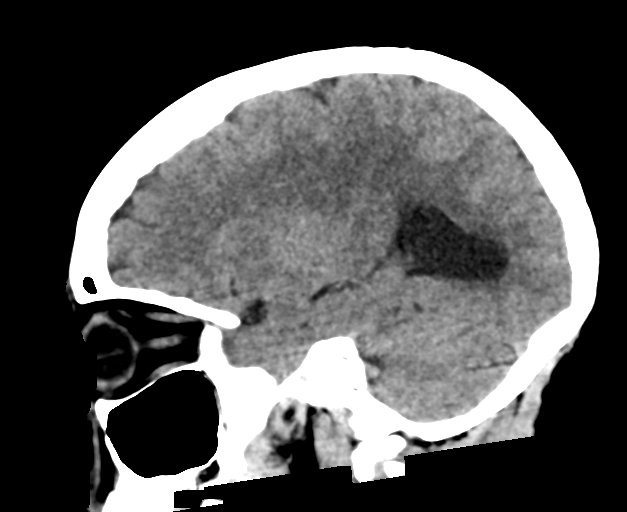

[15 of 47 positions shown; findings below may reference images not displayed]

FINDINGS: Brain: Faint symmetric calcification in the globus pallidus nuclei
bilaterally, probably physiologic. Chronic appearing small infarct
involves the posterior aspect of the head of the caudate nucleus and
the genu of the internal capsule on the left. Suspected small
chronic lacunar infarcts in the head of the right caudate nucleus
and anterior limb of the right internal capsule. Periventricular
white matter and corona radiata hypodensities favor chronic ischemic
microvascular white matter disease.

The brainstem, cerebral peduncles, cerebellum, left thalamus appear
normal. Potential small chronic lacunar infarcts in the right
thalamus. Ventricular system and basilar cisterns unremarkable.

No intracranial hemorrhage, mass lesion, or acute CVA.

No intracranial hemorrhage, mass lesion, or acute CVA.

Vascular: Unremarkable

Skull: Unremarkable

Sinuses/Orbits: Chronic bilateral maxillary sinusitis.

Mildly irregular 1.7 by 1.9 cm lucent lesion along the hard palate,
with possibilities including nasopalatine duct cyst, periapical
granuloma, schwannoma, or malignancy such as squamous cell carcinoma
or adenoid cystic carcinoma. There is a note in the patient's
surgical history of prior maxillofacial surgery, if this was in the
vicinity of the maxilla/hard palate then correlation with that
operation would be recommended.

Other: No supplemental non-categorized findings.
IMPRESSION: 1. No acute intracranial findings.
2. Periventricular white matter and corona radiata hypodensities
favor chronic ischemic microvascular white matter disease.
3. Small chronic lacunar infarcts in the basal ganglia.
4. Mildly irregular 1.7 by 1.9 cm lucent lesion along the hard
palate, with possibilities including nasopalatine duct cyst,
periapical granuloma, schwannoma, or malignancy such as adenoid
cystic carcinoma. There is a note in the patient's surgical history
of prior maxillofacial surgery, if this was in the vicinity of the
maxilla/hard palate then correlation with that operation would be
recommended. If the patient's prior surgery is not connected to this
lesion, then maxillofacial MRI with and without contrast with
attention to this hard palate mass would be recommended.
5. Chronic bilateral maxillary sinusitis.

## 2020-01-26 MED ORDER — PREDNISONE 20 MG PO TABS: ORAL_TABLET | ORAL | 0 refills | Status: DC

## 2020-01-26 MED ORDER — PREDNISONE 20 MG PO TABS
60.0000 mg | ORAL_TABLET | Freq: Once | ORAL | Status: AC
  Administered 2020-01-26: 11:00:00 60 mg via ORAL
  Filled 2020-01-26: qty 3

## 2020-01-26 MED ORDER — VALACYCLOVIR HCL 1 G PO TABS: 1000.0000 mg | ORAL_TABLET | Freq: Three times a day (TID) | ORAL | 0 refills | Status: DC

## 2020-01-26 NOTE — Discharge Instructions (Addendum)
Please follow-up with your family doctor.  The neurology office should call you to set up an appointment in the next couple days.  Take the steroids and antiviral medicine as prescribed.  If this is caused by shingles there is a high likelihood that you will have a rash pop up on the right side of your face.  Please return for eye pain or an inability to close the eye.  Please return for worsening weakness especially if it is one-sided.  I have put an order for the social worker to contact you to try and set up physical therapy.

## 2020-01-26 NOTE — ED Provider Notes (Signed)
[facility] EMERGENCY DEPARTMENT Provider Note   CSN: [id number] Arrival date & time: 01/26/20  [id number]     History Chief Complaint  Patient presents with  . Slurred Speech    [phone] Sheila Perez is a 74 y.o. female.  74 yo F with a chief complaints of slurred speech.  This started yesterday morning and has persisted.  States she is able to talk but has trouble forming her words.  Denies difficulty with swallowing.  No new medications.  She denies head injury or loss consciousness.  Denies headache or neck pain.  Denies one-sided numbness or weakness.  Chronic gait instability but not changed.  The history is provided by the patient.  Illness Severity:  Moderate Onset quality:  Gradual Duration:  1 day Timing:  Constant Progression:  Worsening Chronicity:  New Associated symptoms: no chest pain, no congestion, no fever, no headaches, no myalgias, no nausea, no rhinorrhea, no shortness of breath, no vomiting and no wheezing        Past Medical History:  Diagnosis Date  . Hypertension     There are no problems to display for this patient.   Past Surgical History:  Procedure Laterality Date  . ABDOMINAL HYSTERECTOMY    . maxilo facial surgery       OB History   No obstetric history on file.     Family History  Problem Relation Age of Onset  . Kidney disease Mother   . Diabetes Mother   . Alcohol abuse Father   . Diabetes Sister   . Diabetes Daughter   . Diabetes Sister     Social History   Tobacco Use  . Smoking status: Never Smoker  . Smokeless tobacco: Never Used  Substance Use Topics  . Alcohol use: No  . Drug use: No    Home Medications Prior to Admission medications   Medication Sig Start Date End Date Taking? Authorizing Provider  Ascorbic Acid (VITAMIN C) 1000 MG tablet Take 500 mg by mouth daily.   Yes [provider]  cholecalciferol (VITAMIN D3) 25 MCG (1000 UNIT) tablet Take 1,000 Units by mouth daily.    Yes [provider]  co-enzyme Q-10 30 MG capsule Take 30 mg by mouth daily.   Yes [provider]  predniSONE (DELTASONE) 20 MG tablet 3 tabs po daily x 4 days 01/26/20   Deno Etienne, DO  valACYclovir (VALTREX) 1000 MG tablet Take 1 tablet (1,000 mg total) by mouth 3 (three) times daily. 01/26/20   Deno Etienne, DO    Allergies    Patient has no known allergies.  Review of Systems   Review of Systems  Constitutional: Negative for chills and fever.  HENT: Negative for congestion and rhinorrhea.   Eyes: Negative for redness and visual disturbance.  Respiratory: Negative for shortness of breath and wheezing.   Cardiovascular: Negative for chest pain and palpitations.  Gastrointestinal: Negative for nausea and vomiting.  Genitourinary: Negative for dysuria and urgency.  Musculoskeletal: Negative for arthralgias and myalgias.  Skin: Negative for pallor and wound.  Neurological: Positive for speech difficulty. Negative for dizziness and headaches.    Physical Exam Updated Vital Signs BP (!) 178/94   Pulse 73   Temp 98 F (36.7 C) (Oral)   Resp 19   SpO2 99%   Physical Exam Vitals and nursing note reviewed.  Constitutional:      General: She is not in acute distress.    Appearance: She is well-developed. She is not  diaphoretic.  HENT:     Head: Normocephalic and atraumatic.  Eyes:     Pupils: Pupils are equal, round, and reactive to light.  Cardiovascular:     Rate and Rhythm: Normal rate and regular rhythm.     Heart sounds: No murmur. No friction rub. No gallop.   Pulmonary:     Effort: Pulmonary effort is normal.     Breath sounds: No wheezing or rales.  Abdominal:     General: There is no distension.     Palpations: Abdomen is soft.     Tenderness: There is no abdominal tenderness.  Musculoskeletal:        General: No tenderness.     Cervical back: Normal range of motion and neck supple.  Skin:    General: Skin is warm and dry.  Neurological:      Mental Status: She is alert and oriented to person, place, and time.     Comments: Subtle weakness to the LUE.  ? asymmetric pallate elevation. R sided facial weakness including forehead. No rash.   Psychiatric:        Behavior: Behavior normal.     ED Results / Procedures / Treatments   Labs (all labs ordered are listed, but only abnormal results are displayed) Labs Reviewed  COMPREHENSIVE METABOLIC PANEL - Abnormal; Notable for the following components:      Result Value   Glucose, Bld 112 (*)    Calcium 8.5 (*)    Albumin 3.4 (*)    All other components within normal limits  URINALYSIS, ROUTINE W REFLEX MICROSCOPIC - Abnormal; Notable for the following components:   Color, Urine STRAW (*)    All other components within normal limits  I-STAT CHEM 8, ED - Abnormal; Notable for the following components:   Glucose, Bld 110 (*)    Calcium, Ion 1.06 (*)    All other components within normal limits  ETHANOL  PROTIME-INR  APTT  CBC  DIFFERENTIAL  RAPID URINE DRUG SCREEN, HOSP PERFORMED    EKG EKG Interpretation  Date/Time:  Monday January 26 2020 08:18:04 EDT Ventricular Rate:  75 PR Interval:    QRS Duration: 107 QT Interval:  416 QTC Calculation: 465 R Axis:   44 Text Interpretation: Sinus rhythm Baseline wander in lead(s) V3 No significant change since last tracing Confirmed by Deno Etienne [phone]) on 01/26/2020 8:31:46 AM   Radiology CT HEAD WO CONTRAST  Result Date: 01/26/2020 CLINICAL DATA:  Slurred speech, focal neurologic deficit. EXAM: CT HEAD WITHOUT CONTRAST TECHNIQUE: Contiguous axial images were obtained from the base of the skull through the vertex without intravenous contrast. COMPARISON:  None. FINDINGS: Brain: Faint symmetric calcification in the globus pallidus nuclei bilaterally, probably physiologic. Chronic appearing small infarct involves the posterior aspect of the head of the caudate nucleus and the genu of the internal capsule on the left. Suspected  small chronic lacunar infarcts in the head of the right caudate nucleus and anterior limb of the right internal capsule. Periventricular white matter and corona radiata hypodensities favor chronic ischemic microvascular white matter disease. The brainstem, cerebral peduncles, cerebellum, left thalamus appear normal. Potential small chronic lacunar infarcts in the right thalamus. Ventricular system and basilar cisterns unremarkable. No intracranial hemorrhage, mass lesion, or acute CVA. No intracranial hemorrhage, mass lesion, or acute CVA. Vascular: Unremarkable Skull: Unremarkable Sinuses/Orbits: Chronic bilateral maxillary sinusitis. Mildly irregular 1.7 by 1.9 cm lucent lesion along the hard palate, with possibilities including nasopalatine duct cyst, periapical granuloma, schwannoma, or malignancy such  as squamous cell carcinoma or adenoid cystic carcinoma. There is a note in the patient's surgical history of prior maxillofacial surgery, if this was in the vicinity of the maxilla/hard palate then correlation with that operation would be recommended. Other: No supplemental non-categorized findings. IMPRESSION: 1. No acute intracranial findings. 2. Periventricular white matter and corona radiata hypodensities favor chronic ischemic microvascular white matter disease. 3. 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 4. Mildly irregular 1.7 by 1.9 cm lucent lesion along the hard palate, with possibilities including nasopalatine duct cyst, periapical granuloma, schwannoma, or malignancy such as adenoid cystic carcinoma. There is a note in the patient's surgical history of prior maxillofacial surgery, if this was in the vicinity of the maxilla/hard palate then correlation with that operation would be recommended. If the patient's prior surgery is not connected to this lesion, then maxillofacial MRI with and without contrast with attention to this hard palate mass would be recommended. 5. Chronic bilateral  maxillary sinusitis. Electronically Signed   By: Van Clines M.D.   On: 01/26/2020 08:18   MR BRAIN WO CONTRAST  Result Date: 01/26/2020 CLINICAL DATA:  Slurred speech starting at 8 a.m. EXAM: MRI HEAD WITHOUT CONTRAST TECHNIQUE: Multiplanar, multiecho pulse sequences of the brain and surrounding structures were obtained without intravenous contrast. COMPARISON:  None. FINDINGS: Brain: No acute infarct. Increased diffusion signal at the right basal ganglia is at the level of prior infarction and hemosiderin staining. Remote lacunar infarcts at the deep gray nuclei, brainstem, and right middle cerebellar peduncle. Confluent ischemic gliosis in the deep cerebral white matter. No acute hemorrhage. Chronic blood products are seen along the bilateral posterior cerebral cortex, which may be posttraumatic. Remote micro hemorrhages at the basal ganglia and left cerebellum, likely from small vessel disease. Cerebral volume is overall normal; there is some atrophy of the brainstem which is likely post ischemic. No masslike finding or extra-axial collection Vascular: Preserved flow voids Skull and upper cervical spine: No evidence of bone lesion Sinuses/Orbits: No acute finding IMPRESSION: 1. Motion degraded study without acute finding. 2. Advanced chronic small vessel disease. Electronically Signed   By: Monte Fantasia M.D.   On: 01/26/2020 10:02    Procedures Procedures (including critical care time)  Medications Ordered in ED Medications  predniSONE (DELTASONE) tablet 60 mg (has no administration in time range)    ED Course  I have reviewed the triage vital signs and the nursing notes.  Pertinent labs & imaging results that were available during my care of the patient were reviewed by me and considered in my medical decision making (see chart for details).    MDM Rules/Calculators/A&P                      74 yo F with a cc of slurred speech.  Started yesterday.  Exam with some right facial  weakness including the forehead as well as possible left upper extremity weakness.  Not noted by the patient.  Will start with a CT scan of the head lab work and reassess.  Work appears largely unremarkable.  CT of the head was negative so an MRI was ordered.  This is also negative.  With the patient having facial nerve involvement including the forehead this is likely Bell's palsy.  We will treat as such.  No rash or pain but will start on antivirals as well.  Discussed this with the family who is worried about her generalized weakness.  We will put a consult in  the social work to evaluate for physical therapy or home health.  10:27 AM:  I have discussed the diagnosis/risks/treatment options with the patient and family and believe the pt to be eligible for discharge home to follow-up with PCP, neuro. We also discussed returning to the ED immediately if new or worsening sx occur. We discussed the sx which are most concerning (e.g., sudden worsening pain, fever, inability to tolerate by mouth, stroke s/sx, eye pain or vision change) that necessitate immediate return. Medications administered to the patient during their visit and any new prescriptions provided to the patient are listed below.  Medications given during this visit Medications  predniSONE (DELTASONE) tablet 60 mg (has no administration in time range)     The patient appears reasonably screen and/or stabilized for discharge and I doubt any other medical condition or other [facility] requiring further screening, evaluation, or treatment in the ED at this time prior to discharge.   Final Clinical Impression(s) / ED Diagnoses Final diagnoses:  Bell's palsy    Rx / DC Orders ED Discharge Orders         Ordered    Ambulatory referral to Neurology    Comments: Bells palsy?   01/26/20 1023    valACYclovir (VALTREX) 1000 MG tablet  3 times daily     01/26/20 1023    predniSONE (DELTASONE) 20 MG tablet     01/26/20 [place],  Otis Portal, DO 01/26/20 1027

## 2020-01-26 NOTE — Discharge Planning (Signed)
Lynnex Fulp J. Clydene Laming, RN, BSN, [employer] [phone] Spoke with pt daughter at bedside regarding discharge planning for [facility]. Offered pt list of home health agencies to choose from.  Pt chose [facility] to render services. Brittney of [facility] notified. Patient made aware that [facility] will be in contact in 24-48 hours.  No DME needs identified at this time.

## 2020-01-26 NOTE — Progress Notes (Deleted)
NEUROLOGY CONSULTATION NOTE  Sheila Perez MRN: [id number] DOB: 08-19-1946  Referring provider: Deno Etienne, DO (ED referral) Primary care provider: Betty [name], MD  Reason for consult:  Questionable right-sided Bell's palsy  HISTORY OF PRESENT ILLNESS: Sheila Perez is a 74 year old female who presents for questionable right-sided Bell's palsy.  History supplemented by ED note.  She presented to the ED on 01/26/2020 for one day duration of difficulty forming words.  ***.  No associated numbness, double vision, dysphagia, headache or extremity numbness or weakness.  On exam, she appeared to have right sided upper and lower facial weakness with questionable asymmetric palate elevation and possibly left upper extremity weakness.  CT and then MRI of brain were personally reviewed and ***.  She was diagnosed with Bell's palsy and discharged on prednisone and Valtrex.  History of maxillofacial surgery.    PAST MEDICAL HISTORY: Past Medical History:  Diagnosis Date  . Hypertension     PAST SURGICAL HISTORY: Past Surgical History:  Procedure Laterality Date  . ABDOMINAL HYSTERECTOMY    . maxilo facial surgery      MEDICATIONS: Current Outpatient Medications on File Prior to Visit  Medication Sig Dispense Refill  . Ascorbic Acid (VITAMIN C) 1000 MG tablet Take 500 mg by mouth daily.    . cholecalciferol (VITAMIN D3) 25 MCG (1000 UNIT) tablet Take 1,000 Units by mouth daily.    Marland Kitchen co-enzyme Q-10 30 MG capsule Take 30 mg by mouth daily.    . predniSONE (DELTASONE) 20 MG tablet 3 tabs po daily x 4 days 10 tablet 0  . valACYclovir (VALTREX) 1000 MG tablet Take 1 tablet (1,000 mg total) by mouth 3 (three) times daily. 21 tablet 0   No current facility-administered medications on file prior to visit.    ALLERGIES: No Known Allergies  FAMILY HISTORY: Family History  Problem Relation Age of Onset  . Kidney disease Mother   . Diabetes Mother   . Alcohol abuse  Father   . Diabetes Sister   . Diabetes Daughter   . Diabetes Sister    ***.  SOCIAL HISTORY: Social History   Socioeconomic History  . Marital status: Widowed    Spouse name: Not on file  . Number of children: Not on file  . Years of education: Not on file  . Highest education level: Not on file  Occupational History  . Not on file  Tobacco Use  . Smoking status: Never Smoker  . Smokeless tobacco: Never Used  Substance and Sexual Activity  . Alcohol use: No  . Drug use: No  . Sexual activity: Not Currently  Other Topics Concern  . Not on file  Social History Narrative  . Not on file   Social Determinants of Health   Financial Resource Strain:   . Difficulty of Paying Living Expenses:   Food Insecurity:   . Worried About Charity fundraiser in the Last Year:   . Arboriculturist in the Last Year:   Transportation Needs:   . Film/video editor (Medical):   Marland Kitchen Lack of Transportation (Non-Medical):   Physical Activity:   . Days of Exercise per Week:   . Minutes of Exercise per Session:   Stress:   . Feeling of Stress :   Social Connections:   . Frequency of Communication with Friends and Family:   . Frequency of Social Gatherings with Friends and Family:   . Attends Religious Services:   . Active Member  of Clubs or Organizations:   . Attends Archivist Meetings:   Marland Kitchen Marital Status:   Intimate Partner Violence:   . Fear of Current or Ex-Partner:   . Emotionally Abused:   Marland Kitchen Physically Abused:   . Sexually Abused:     REVIEW OF SYSTEMS: Constitutional: No fevers, chills, or sweats, no generalized fatigue, change in appetite Eyes: No visual changes, double vision, eye pain Ear, nose and throat: No hearing loss, ear pain, nasal congestion, sore throat Cardiovascular: No chest pain, palpitations Respiratory:  No shortness of breath at rest or with exertion, wheezes GastrointestinaI: No nausea, vomiting, diarrhea, abdominal pain, fecal  incontinence Genitourinary:  No dysuria, urinary retention or frequency Musculoskeletal:  No neck pain, back pain Integumentary: No rash, pruritus, skin lesions Neurological: as above Psychiatric: No depression, insomnia, anxiety Endocrine: No palpitations, fatigue, diaphoresis, mood swings, change in appetite, change in weight, increased thirst Hematologic/Lymphatic:  No purpura, petechiae. Allergic/Immunologic: no itchy/runny eyes, nasal congestion, recent allergic reactions, rashes  PHYSICAL EXAM: *** General: No acute distress.  Patient appears ***-groomed.  *** Head:  Normocephalic/atraumatic Eyes:  fundi examined but not visualized Neck: supple, no paraspinal tenderness, full range of motion Back: No paraspinal tenderness Heart: regular rate and rhythm Lungs: Clear to auscultation bilaterally. Vascular: No carotid bruits. Neurological Exam: Mental status: alert and oriented to person, place, and time, recent and remote memory intact, fund of knowledge intact, attention and concentration intact, speech fluent and not dysarthric, language intact. Cranial nerves: CN I: not tested CN II: pupils equal, round and reactive to light, visual fields intact CN III, IV, VI:  full range of motion, no nystagmus, no ptosis CN V: facial sensation intact CN VII: upper and lower face symmetric CN VIII: hearing intact CN IX, X: gag intact, uvula midline CN XI: sternocleidomastoid and trapezius muscles intact CN XII: tongue midline Bulk & Tone: normal, no fasciculations. Motor:  5/5 throughout *** Sensation:  Pinprick *** temperature *** and vibration sensation intact.  ***. Deep Tendon Reflexes:  2+ throughout, *** toes downgoing.  *** Finger to nose testing:  Without dysmetria.  *** Heel to shin:  Without dysmetria.  *** Gait:  Normal station and stride.  Able to turn and tandem walk. Romberg ***.  IMPRESSION: ***  PLAN: ***  Thank you for allowing me to take part in the care of  this patient.  Metta Clines, DO  CC: ***

## 2020-01-26 NOTE — ED Notes (Signed)
Daughter Arbie Cookey would like an update and would like to know if she can come to visit. [phone]

## 2020-01-26 NOTE — ED Triage Notes (Signed)
Pt arrives to ED from home where she lives with her daughter with complaints of slurred speech starting at 0800 (01/25/20) yesterday. Patient denies vision issues, one sided weakness, and sensory deficients. Patients NIH is 1 on arrival for dysarthria.

## 2020-01-27 ENCOUNTER — Ambulatory Visit: Payer: Medicare HMO | Admitting: Neurology

## 2020-01-30 ENCOUNTER — Telehealth: Payer: Self-pay | Admitting: Family Medicine

## 2020-01-30 ENCOUNTER — Other Ambulatory Visit: Payer: Self-pay | Admitting: Family Medicine

## 2020-01-30 ENCOUNTER — Telehealth: Payer: Self-pay

## 2020-01-30 ENCOUNTER — Encounter: Payer: Self-pay | Admitting: Family Medicine

## 2020-01-30 DIAGNOSIS — I10 Essential (primary) hypertension: Secondary | ICD-10-CM | POA: Insufficient documentation

## 2020-01-30 MED ORDER — AMLODIPINE BESYLATE 5 MG PO TABS: 5.0000 mg | ORAL_TABLET | Freq: Every day | ORAL | 0 refills | Status: DC

## 2020-01-30 NOTE — Telephone Encounter (Signed)
Prescription for amlodipine 5 mg was sent to her pharmacy to take at bedtime. Continue monitoring BP twice daily. Low salt diet. 2-3 weeks follow-up, ideally here in the office. Please instruct to bring BP monitor.  Thanks, BJ

## 2020-01-30 NOTE — Telephone Encounter (Signed)
Sheila Perez PT with [facility] stated the pt was in the hospital this past Monday and she was referred to [facility]. Her BP has been high 156/98 and the Nurse is wondering if the pt is suppose to take anything for BP or if PCP needs to prescribe anything? She also wants to know if [name] will sign her home health orders? She will fax it over.   Sheila Perez can be reached at [phone]

## 2020-01-30 NOTE — Telephone Encounter (Signed)
Patient needs an in office follow up with Dr. [name] in about 2-3 weeks for her blood pressure.

## 2020-01-30 NOTE — Telephone Encounter (Signed)
I left Sheila Perez a voicemail with the information below & advised her to call back with any questions.

## 2020-02-03 ENCOUNTER — Telehealth: Payer: Self-pay | Admitting: Family Medicine

## 2020-02-03 NOTE — Telephone Encounter (Signed)
Saralyn Pilar physical therapist wanted to inform that pt blood pressure was 188/120. Pt had no symptoms and is doing well. Pt also informed Saralyn Pilar that she has blood pressure mediction she just has not picked it up yet.

## 2020-02-03 NOTE — Telephone Encounter (Signed)
Please arrange appt for this week. Continue monitoring BP and bring BP monitor with her. If headache, visual abnormalities,MS changes,CP,SOB,focal weakness among some, she needs to go to the ER. Thanks, BJ

## 2020-02-03 NOTE — Telephone Encounter (Signed)
Please advised  

## 2020-02-05 ENCOUNTER — Telehealth: Payer: Self-pay | Admitting: Family Medicine

## 2020-02-05 DIAGNOSIS — Z0100 Encounter for examination of eyes and vision without abnormal findings: Secondary | ICD-10-CM

## 2020-02-05 NOTE — Telephone Encounter (Addendum)
Saralyn Pilar from [facility] states the pt bp was taken by her left arm with a manual cuff and it was 182/110  No symptoms-he attempted to take it with a wrist cuff but was getting error readings.   Saralyn Pilar stated this is the second visit this week with a high bp reading. He feels she may need to be seen as soon as possible.   Saralyn Pilar can be reached at [phone]   This note is not being shared with the patient for the following reason: To respect privacy (The patient or proxy has requested that the information not be shared).

## 2020-02-05 NOTE — Telephone Encounter (Addendum)
Pt's daughter, Elder Cyphers, states that the pt can not walk long distances so she would like to get her set up with some kind of mobility device. She would also like to speak about a handicap sticker and her bedding.   Her daughter would also like a referral to dental and vision. She has an issue with her teeth-she complains about chewing and seems like she can not chew on her back teeth well.   Theodis Shove can be reached at [phone]

## 2020-02-06 NOTE — Telephone Encounter (Signed)
Can you see if pt and her daughter can come into the office on Monday afternoon to be seen by Dr. [name] for her blood pressure? We can also do her handicap placard & referrals at that time too.

## 2020-02-06 NOTE — Addendum Note (Signed)
Addended by: Rodrigo Ran on: 02/06/2020 04:45 PM   Modules accepted: Orders

## 2020-02-06 NOTE — Telephone Encounter (Signed)
Okay for referrals? I placed the Handicap placard in your box. The mobility device will have to wait until her in office visit.

## 2020-02-06 NOTE — Telephone Encounter (Signed)
Most insurance do not require referral for eye exam, but if hers does it it Ok to place one. She does not need referral for dental appt. Thanks, BJ

## 2020-02-06 NOTE — Telephone Encounter (Signed)
I've sent a message to get her scheduled for Monday afternoon.

## 2020-02-06 NOTE — Telephone Encounter (Signed)
Called pt's daughter Theodis Shove-- she stated that her sister and her will try to work out a closer day and call back if they are able to. If not they will keep the appt on April 7th

## 2020-02-10 NOTE — Telephone Encounter (Signed)
Saralyn Pilar from [facility] stated that pt's bp is 175/96 and he took it with a wrist cuff. PT has been on hold due to the high bp and thinks that a verbal order for at home nursing should help. Lastly, he would like to put the PT on hold until her bp is under control--he would need a verbal order for that if PCP agrees.   Saralyn Pilar can be reached at [phone]     Mrs Levada Dy stated she could bring her mother in at an earlier time than April 7th. Informed her that I will sent this note for Jordan's CMA to see if she is able to squeeze her in sooner. She can be reached at [phone]

## 2020-02-16 NOTE — Telephone Encounter (Signed)
Message Routed to PCP for review. 

## 2020-02-16 NOTE — Telephone Encounter (Signed)
There is a open spot for same day at 11 am tomorrow, 02/17/20. Thanks, BJ

## 2020-02-17 NOTE — Telephone Encounter (Signed)
/  Noted. Patient has ov on 02/18/20.

## 2020-02-17 NOTE — Telephone Encounter (Signed)
Patient scheduled for 02/18/20 at 2 pm. Nothing further needed at this time.

## 2020-02-18 ENCOUNTER — Encounter: Payer: Self-pay | Admitting: Family Medicine

## 2020-02-18 ENCOUNTER — Other Ambulatory Visit: Payer: Self-pay

## 2020-02-18 ENCOUNTER — Ambulatory Visit (INDEPENDENT_AMBULATORY_CARE_PROVIDER_SITE_OTHER): Payer: Medicare HMO | Admitting: Family Medicine

## 2020-02-18 VITALS — BP 150/100 | HR 90 | Temp 98.4°F | Resp 16 | Ht 63.0 in | Wt 205.6 lb

## 2020-02-18 DIAGNOSIS — R7303 Prediabetes: Secondary | ICD-10-CM

## 2020-02-18 DIAGNOSIS — I639 Cerebral infarction, unspecified: Secondary | ICD-10-CM

## 2020-02-18 DIAGNOSIS — E785 Hyperlipidemia, unspecified: Secondary | ICD-10-CM | POA: Diagnosis not present

## 2020-02-18 DIAGNOSIS — R4781 Slurred speech: Secondary | ICD-10-CM

## 2020-02-18 DIAGNOSIS — I10 Essential (primary) hypertension: Secondary | ICD-10-CM | POA: Diagnosis not present

## 2020-02-18 MED ORDER — AMLODIPINE BESYLATE 10 MG PO TABS: 10.0000 mg | ORAL_TABLET | Freq: Every day | ORAL | 1 refills | Status: DC

## 2020-02-18 MED ORDER — ATORVASTATIN CALCIUM 20 MG PO TABS: 20.0000 mg | ORAL_TABLET | Freq: Every day | ORAL | 3 refills | Status: DC

## 2020-02-18 NOTE — Patient Instructions (Signed)
A few things to remember from today's visit:   Hypertension, essential, benign  Prediabetes  Hyperlipidemia, unspecified hyperlipidemia type - Plan: Lipid panel  Slurred speech - Plan: Ambulatory referral to Neurology  Today Amlodipine was increased to 10 mg, take medication at bedtime. Continue low salt diet. Check blood pressure at home. Atorvastatin 20 mg added. Keep appointment with neuro.   Please be sure medication list is accurate. If a new problem present, please set up appointment sooner than planned today.

## 2020-02-18 NOTE — Progress Notes (Signed)
HPI:  Sheila Perez is a 74 y.o. female, who is here today with her daughter concerned about elevated BP. She was last seen on 12/05/2019.  Recently evaluated the ER due to facial weakness, diagnosed with Bell's palsy (01/26/2020). She presented to the ER with slurred speech. According to daughter, facial weakness was not very evident.  She has an appt with neurologists but her daughter would like a referral place hoping that she can be seen sooner. Completed treatment with Acyclovir and Prednisone.  Brain MRI done in the ER, 01/26/20: No acute infarct. Increased diffusion signal at the right basal ganglia is at the level of prior infarction and hemosiderin staining. Remote lacunar infarcts at the deep gray nuclei,brainstem, and right middle cerebellar peduncle. Confluent ischemic gliosis in the deep cerebral white matter. No acute hemorrhage. Chronic blood products are seen along the bilateral posterior cerebral cortex, which may be posttraumatic. Remote micro hemorrhages at the basal ganglia and left cerebellum, likely from small vessel disease Negative for headache,visual changes,dysphagia,numbness,tingling,or focal weakness.  She had a fall recently when she was trying to get in a car.She got up by herself.  According to daughter,she has had "balance issues",  PT was discontinued because elevated BP.  She uses a cane at home. Her daughter is requesting a Rx for device she can use for long distance walk and that allows her to sit if needed.  HLD: She is not on pharmacologic treatment. FLP on 02/24/16: TC 218, LDL 163,HLD 38,TG 83.  Prediabetes: Negative for polydipsia,polyuria, or polyphagia.  She is trying to ear healthier. Plan based diet  And lower salt intake.  Lab Results  Component Value Date   HGBA1C 5.9 12/05/2019   HTN:  BP's taken by PT 170-180/100's. She has not noted visual changes, chest pain, dyspnea, palpitation, or edema. She is on  Amlodipine 5 mg daily.  Lab Results  Component Value Date   CREATININE 0.80 01/26/2020   BUN 18 01/26/2020   NA 140 01/26/2020   K 3.7 01/26/2020   CL 104 01/26/2020   CO2 25 01/26/2020   Review of Systems  Constitutional: Negative for activity change, appetite change, fatigue and fever.  HENT: Negative for mouth sores, nosebleeds and sore throat.   Respiratory: Negative for cough and wheezing.   Cardiovascular: Negative for chest pain.  Gastrointestinal: Negative for abdominal pain, nausea and vomiting.       Negative for changes in bowel habits.  Genitourinary: Negative for decreased urine volume and hematuria.  Skin: Negative for rash and wound.  Neurological: Negative for syncope, facial asymmetry and speech difficulty.  Rest of ROS, see pertinent positives sand negatives in HPI  Current Outpatient Medications on File Prior to Visit  Medication Sig Dispense Refill  . Ascorbic Acid (VITAMIN C) 1000 MG tablet Take 500 mg by mouth daily.    . cholecalciferol (VITAMIN D3) 25 MCG (1000 UNIT) tablet Take 1,000 Units by mouth daily.    Marland Kitchen co-enzyme Q-10 30 MG capsule Take 30 mg by mouth daily.     No current facility-administered medications on file prior to visit.     Past Medical History:  Diagnosis Date  . Hypertension    No Known Allergies  Social History   Socioeconomic History  . Marital status: Widowed    Spouse name: Not on file  . Number of children: Not on file  . Years of education: Not on file  . Highest education level: Not on file  Occupational  History  . Not on file  Tobacco Use  . Smoking status: Never Smoker  . Smokeless tobacco: Never Used  Substance and Sexual Activity  . Alcohol use: No  . Drug use: No  . Sexual activity: Not Currently  Other Topics Concern  . Not on file  Social History Narrative  . Not on file   Social Determinants of Health   Financial Resource Strain:   . Difficulty of Paying Living Expenses:   Food Insecurity:   .  Worried About Charity fundraiser in the Last Year:   . Arboriculturist in the Last Year:   Transportation Needs:   . Film/video editor (Medical):   Marland Kitchen Lack of Transportation (Non-Medical):   Physical Activity:   . Days of Exercise per Week:   . Minutes of Exercise per Session:   Stress:   . Feeling of Stress :   Social Connections:   . Frequency of Communication with Friends and Family:   . Frequency of Social Gatherings with Friends and Family:   . Attends Religious Services:   . Active Member of Clubs or Organizations:   . Attends Archivist Meetings:   Marland Kitchen Marital Status:     Vitals:   02/18/20 1411  BP: (!) 150/100  Pulse: 90  Resp: 16  Temp: 98.4 F (36.9 C)  SpO2: 99%   Body mass index is 36.42 kg/m.   Physical Exam  Nursing note and vitals reviewed. Constitutional: She is oriented to person, place, and time. She appears well-developed. No distress.  HENT:  Head: Normocephalic and atraumatic.  Mouth/Throat: Oropharynx is clear and moist and mucous membranes are normal.  Eyes: Pupils are equal, round, and reactive to light. Conjunctivae are normal.  Cardiovascular: Normal rate and regular rhythm.  No murmur heard. Pulses:      Dorsalis pedis pulses are 2+ on the right side and 2+ on the left side.  Respiratory: Effort normal and breath sounds normal. No respiratory distress.  GI: Soft. She exhibits no mass. There is no abdominal tenderness.  Musculoskeletal:        General: Edema (Trace pitting edema LE, bilateral.) present.  Neurological: She is alert and oriented to person, place, and time. She has normal strength. No cranial nerve deficit.  Mildly unstable gait, not assisted. I do not appreciate facial weakness.  Skin: Skin is warm. No rash noted. No erythema.  Psychiatric: She has a normal mood and affect.  Well groomed, good eye contact.   ASSESSMENT AND PLAN:   Ms. Tawatha Holik was seen today for follow-up.  Orders Placed  This Encounter  Procedures  . Lipid panel  . Ambulatory referral to Neurology   Lab Results  Component Value Date   CHOL 239 (H) 02/18/2020   HDL 46.80 02/18/2020   LDLCALC 175 (H) 02/18/2020   TRIG 86.0 02/18/2020   CHOLHDL 5 02/18/2020    1. Prediabetes Healthy life style for primary prevention of diabetes.  2. Slurred speech It seems resolved,no appreciated today. ? TIA. She has an appt with neuro in 03/2020, referral placed. Instructed about warning signs. - Ambulatory referral to Neurology  3. Hypertension, essential, benign BP is not adequately controlled,rechecked 160/105 LUE.  We discussed complications of elevated BP. Options discussed, she prefers to increase dose of Amlodipine from 5 mg to 10 mg.Recommend taking med at bedtime. Continue low salt diet. Monitor BP at home. Instructed about warning signs.  4. Hyperlipidemia, unspecified hyperlipidemia type Benefit of  stain medications discussed,she agrees with starting Atorvastatin 20 mg daily. Low fat diet to continue.  5. Cerebrovascular accident (CVA), unspecified mechanism ([place]) Atorvastatin added today. Will hold on Aspirin until BP is  Better controlled. Adequate BP controlled for prevention of new events.   Return in about 2 months (around 04/19/2020).     Moustafa Mossa G. [name], MD  [facility]. [place] office.

## 2020-02-19 ENCOUNTER — Encounter: Payer: Self-pay | Admitting: Family Medicine

## 2020-02-19 LAB — LIPID PANEL
Cholesterol: 239 mg/dL — ABNORMAL HIGH (ref 0–200)
HDL: 46.8 mg/dL (ref >39.00)
LDL Cholesterol: 175 mg/dL — ABNORMAL HIGH (ref 0–99)
NonHDL: 191.83
Total CHOL/HDL Ratio: 5
Triglycerides: 86 mg/dL (ref 0.0–149.0)
VLDL: 17.2 mg/dL (ref 0.0–40.0)

## 2020-02-26 ENCOUNTER — Telehealth: Payer: Self-pay | Admitting: Family Medicine

## 2020-02-26 NOTE — Telephone Encounter (Signed)
Sheila Perez from [facility] stated she saw the pt today for PT and her BP without meds was 188/110. The pt took her meds and she rechecked it after 15 mins and it was the same. Sheila Perez stated her bp is constantly high and wondering if a nursing evaluation can be sent to help with her bp b/c they are unable to PT with her BP being high.   Can be reached at [phone] -ok to leave a detailed message

## 2020-02-27 ENCOUNTER — Encounter: Payer: Self-pay | Admitting: Family Medicine

## 2020-02-27 ENCOUNTER — Telehealth: Payer: Self-pay | Admitting: Family Medicine

## 2020-02-27 ENCOUNTER — Other Ambulatory Visit: Payer: Self-pay | Admitting: Family Medicine

## 2020-02-27 DIAGNOSIS — R32 Unspecified urinary incontinence: Secondary | ICD-10-CM | POA: Insufficient documentation

## 2020-02-27 DIAGNOSIS — I10 Essential (primary) hypertension: Secondary | ICD-10-CM

## 2020-02-27 MED ORDER — BENAZEPRIL HCL 10 MG PO TABS: 10.0000 mg | ORAL_TABLET | Freq: Every day | ORAL | 1 refills | Status: DC

## 2020-02-27 NOTE — Telephone Encounter (Signed)
Pt's daughter, Theodis Shove, is wondering if [name] can send in a px for PureWick (external catheter)? Not sure if she needs to pick this up at a [facility] place but provided a pharmacy just incase.   Pharmacy:  [facility] [place]: [phone]   Theodis Shove can be reached at [phone]

## 2020-02-27 NOTE — Telephone Encounter (Signed)
Rx written. Thanks, BJ

## 2020-02-27 NOTE — Telephone Encounter (Signed)
Message Routed to PCP for review and approval. 

## 2020-02-27 NOTE — Telephone Encounter (Signed)
I would like to add Benazepril 10 mg daily. No changes in Amlodipine. BMP needs to be done in 7-10 days. Rx sent and lab order placed. Continue monitoring BP. Thanks, BJ

## 2020-03-01 NOTE — Telephone Encounter (Signed)
Patient's daughter notified and verbalized understanding. Will pick up after 4 pm.

## 2020-03-01 NOTE — Telephone Encounter (Signed)
Patients daughter notified and verbalized understanding 

## 2020-03-08 ENCOUNTER — Other Ambulatory Visit: Payer: Self-pay | Admitting: *Deleted

## 2020-03-25 NOTE — Progress Notes (Signed)
NEUROLOGY CONSULTATION NOTE  Sheila Perez MRN: [id number] DOB: 03/26/1946  Referring provider: Betty [name], MD Primary care provider: Betty [name], MD  Reason for consult:  Right-sided Bell's palsy  HISTORY OF PRESENT ILLNESS: Sheila Perez is a 74 year old right-handed black female with HTN and prediabetes who presents for right-sided Bell's palsy.  History supplemented by ED and referring provider's notes.  She is accompanied by her daughter who also supplements history.  On 01/25/2020, she started having trouble forming her words.  No associated headache, facial numbness, rash, trouble swallowing, or weakness.  She went to the ED the following day because symptoms persisted. As per ED note, on exam the most notable findings was right upper and lower facial weakness but also questioned mild asymmetric pallate elevation and subtle left upper extremity weakness.  CT and MRI of brain were negative for acute abnormality but did demonstrated significant chronic small vessel ischemic changes.  She was diagnosed with right-sided Bell's palsy and discharged on anti-viral and a prednisone taper.  Slurred speech has resolved gradually over the next week.    Of note, she reports 5 year history of weakness in both legs.  No back pain or pain in legs.  No lower extremity numbness.  She has fallen once.  She reportedly is ordered for vascular studies.  She takes atorvastatin which has only recently been started.  She has never before been on a statin.  Again, so trouble swallowing.  Denies double vision.  No bowel or bladder retention.  Her daughter states that she has a sedentary lifestyle.  01/26/2020 CT HEAD WO:  1. No acute intracranial findings. 2. Periventricular white matter and corona radiata hypodensities favor chronic ischemic microvascular white matter disease. 3. 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 4. Mildly irregular 1.7 by 1.9 cm lucent lesion along the  hard palate, with possibilities including nasopalatine duct cyst, periapical granuloma, schwannoma, or malignancy such as adenoid cystic carcinoma. There is a note in the patient's surgical history of prior maxillofacial surgery, if this was in the vicinity of the maxilla/hard palate then correlation with that operation would be recommended. If the patient's prior surgery is not connected to this lesion, then maxillofacial MRI with and without contrast with attention to this hard palate mass would be recommended.  5. Chronic bilateral maxillary sinusitis.  01/26/2020 MRI BRAIN WO:  Brain: No acute infarct. Increased diffusion signal at the right basal ganglia is at the level of prior infarction and hemosiderin staining. Remote lacunar infarcts at the deep gray nuclei, brainstem, and right middle cerebellar peduncle. Confluent ischemic gliosis in the deep cerebral white matter. No acute hemorrhage.  Chronic blood products are seen along the bilateral posterior cerebral cortex, which may be posttraumatic. Remote micro hemorrhages at the basal ganglia and left cerebellum, likely from small vessel disease. Cerebral volume is overall normal; there is some atrophy of the brainstem which is likely post ischemic. No masslike finding or extra-axial collection.  Vascular: Preserved flow voids.  Skull and upper cervical spine: No evidence of bone lesion.  Sinuses/Orbits: No acute finding  12/05/2019 LABS:  HGB A1c 5.9 02/18/2020 LABS:  LDL 175;  PAST MEDICAL HISTORY: Past Medical History:  Diagnosis Date  . Hypertension     PAST SURGICAL HISTORY: Past Surgical History:  Procedure Laterality Date  . ABDOMINAL HYSTERECTOMY    . maxilo facial surgery      MEDICATIONS: Current Outpatient Medications on File Prior to Visit  Medication Sig Dispense Refill  .  amLODipine (NORVASC) 10 MG tablet Take 1 tablet (10 mg total) by mouth daily. 90 tablet 1  . Ascorbic Acid (VITAMIN C) 1000 MG tablet Take 500 mg by  mouth daily.    Marland Kitchen atorvastatin (LIPITOR) 20 MG tablet Take 1 tablet (20 mg total) by mouth daily. 90 tablet 3  . benazepril (LOTENSIN) 10 MG tablet Take 1 tablet (10 mg total) by mouth daily. 30 tablet 1  . cholecalciferol (VITAMIN D3) 25 MCG (1000 UNIT) tablet Take 1,000 Units by mouth daily.    Marland Kitchen co-enzyme Q-10 30 MG capsule Take 30 mg by mouth daily.     No current facility-administered medications on file prior to visit.    ALLERGIES: No Known Allergies  FAMILY HISTORY: Family History  Problem Relation Age of Onset  . Kidney disease Mother   . Diabetes Mother   . Alcohol abuse Father   . Diabetes Sister   . Diabetes Daughter   . Diabetes Sister     SOCIAL HISTORY: Social History   Socioeconomic History  . Marital status: Widowed    Spouse name: Not on file  . Number of children: Not on file  . Years of education: Not on file  . Highest education level: Not on file  Occupational History  . Not on file  Tobacco Use  . Smoking status: Never Smoker  . Smokeless tobacco: Never Used  Substance and Sexual Activity  . Alcohol use: No  . Drug use: No  . Sexual activity: Not Currently  Other Topics Concern  . Not on file  Social History Narrative  . Not on file   Social Determinants of Health   Financial Resource Strain:   . Difficulty of Paying Living Expenses:   Food Insecurity:   . Worried About Charity fundraiser in the Last Year:   . Arboriculturist in the Last Year:   Transportation Needs:   . Film/video editor (Medical):   Marland Kitchen Lack of Transportation (Non-Medical):   Physical Activity:   . Days of Exercise per Week:   . Minutes of Exercise per Session:   Stress:   . Feeling of Stress :   Social Connections:   . Frequency of Communication with Friends and Family:   . Frequency of Social Gatherings with Friends and Family:   . Attends Religious Services:   . Active Member of Clubs or Organizations:   . Attends Archivist Meetings:   Marland Kitchen  Marital Status:   Intimate Partner Violence:   . Fear of Current or Ex-Partner:   . Emotionally Abused:   Marland Kitchen Physically Abused:   . Sexually Abused:     PHYSICAL EXAM: Blood pressure (!) 177/106, pulse 81, height 5\' 3"  (1.6 m), weight 206 lb 6.4 oz (93.6 kg), SpO2 100 %. General: No acute distress.  Patient appears well-groomed.  Head:  Normocephalic/atraumatic Eyes:  fundi examined but not visualized Neck: supple, no paraspinal tenderness, full range of motion Back: No paraspinal tenderness Heart: regular rate and rhythm Lungs: Clear to auscultation bilaterally. Vascular: No carotid bruits. Neurological Exam: Mental status: alert and oriented to person, place, and time, recent and remote memory intact, fund of knowledge intact, attention and concentration intact, speech fluent and not dysarthric, language intact. Cranial nerves: CN I: not tested CN II: pupils equal, round and reactive to light, visual fields intact CN III, IV, VI:  full range of motion, no nystagmus, no ptosis CN V: facial sensation intact CN VII: upper and lower  face symmetric CN VIII: hearing intact CN IX, X: gag intact, uvula midline CN XI: sternocleidomastoid and trapezius muscles intact CN XII: tongue midline Bulk & Tone: normal, no fasciculations. Motor:  Requires pushing up with upper extremities onto arm rests to stand up.  Maybe 5-/5 in bilateral hip flexors; otherwise 5/5 throughout Sensation:  Pinprick and vibration sensation intact. Deep Tendon Reflexes:  3+ throughout except absent in ankles,  toes downgoing.  Finger to nose testing:  Without dysmetria.  Heel to shin:  Without dysmetria.  Gait:  Mildly wide-based and cautious gait. Romberg negative.  IMPRESSION: 1.  Right sided Bell's palsy.  History is not convincing.  Unclear as patient and daughter didn't appreciate any facial asymmetry and ED provider's findings seemed rather soft. A small MRI-negative stroke could not be ruled out.     Given  that she has underlying cerebrovascular disease on imaging, I would recommend starting ASA 81mg  daily,  2.  Lower extremity weakness.  She has hyperreflexia on exam, which may be normal for her but would like to rule out cervical myelopathy, such as due to cervical spinal stenosis.  She had trouble standing up from chair without use of her arms, but her muscle strength when tested individually is rather intact.  I think decompensation from prolonged disuse is possible.  3.  HTN   PLAN: 1.  Start ASA 81mg  daily and continue statin therapy 2.  Check MRI of cervical spine without contrast 3.  Check CK 4.  Further recommendations pending results.   5.  Follow up with PCP regarding elevated blood pressure.  Thank you for allowing me to take part in the care of this patient.  Metta Clines, DO  CC: Betty [name], MD

## 2020-03-29 ENCOUNTER — Ambulatory Visit: Payer: Medicare HMO | Admitting: Neurology

## 2020-03-29 ENCOUNTER — Encounter: Payer: Self-pay | Admitting: Neurology

## 2020-03-29 ENCOUNTER — Other Ambulatory Visit (INDEPENDENT_AMBULATORY_CARE_PROVIDER_SITE_OTHER): Payer: Medicare HMO

## 2020-03-29 ENCOUNTER — Other Ambulatory Visit: Payer: Self-pay

## 2020-03-29 VITALS — BP 177/106 | HR 81 | Ht 63.0 in | Wt 206.4 lb

## 2020-03-29 DIAGNOSIS — R29898 Other symptoms and signs involving the musculoskeletal system: Secondary | ICD-10-CM | POA: Diagnosis not present

## 2020-03-29 DIAGNOSIS — R292 Abnormal reflex: Secondary | ICD-10-CM | POA: Diagnosis not present

## 2020-03-29 DIAGNOSIS — R4781 Slurred speech: Secondary | ICD-10-CM

## 2020-03-29 DIAGNOSIS — I10 Essential (primary) hypertension: Secondary | ICD-10-CM

## 2020-03-29 DIAGNOSIS — R2681 Unsteadiness on feet: Secondary | ICD-10-CM | POA: Diagnosis not present

## 2020-03-29 LAB — CK: Total CK: 86 U/L (ref 7–177)

## 2020-03-29 NOTE — Patient Instructions (Addendum)
1.  I would like to check MRI of cervical spine without contrast to evaluate for arthritis or disc pressing the spinal cord. We have sent a referral to [facility] for your MRI and they will call you directly to schedule your appointment. They are located at [place]. If you need to contact them directly please call [phone].  2.  We will also check CK level 3.  I would recommend taking aspirin 81mg  daily.  Unclear if the episode of slurred speech may have been a small stroke that wasn't seen on MRI. 4.  Further recommendations pending results.

## 2020-04-23 ENCOUNTER — Telehealth: Payer: Self-pay

## 2020-04-26 NOTE — Telephone Encounter (Signed)
Close encounter 

## 2020-05-05 ENCOUNTER — Other Ambulatory Visit: Payer: Self-pay | Admitting: Family Medicine

## 2020-05-05 DIAGNOSIS — I739 Peripheral vascular disease, unspecified: Secondary | ICD-10-CM

## 2020-05-07 ENCOUNTER — Encounter: Payer: Self-pay | Admitting: Family Medicine

## 2020-05-07 ENCOUNTER — Other Ambulatory Visit: Payer: Self-pay

## 2020-05-07 ENCOUNTER — Ambulatory Visit (HOSPITAL_COMMUNITY)
Admission: RE | Admit: 2020-05-07 | Discharge: 2020-05-07 | Disposition: A | Discharge status: Home / Self Care | Payer: Medicare HMO | Source: Ambulatory Visit | Attending: Family Medicine | Admitting: Family Medicine

## 2020-05-07 DIAGNOSIS — I739 Peripheral vascular disease, unspecified: Secondary | ICD-10-CM | POA: Insufficient documentation

## 2020-05-19 ENCOUNTER — Encounter: Payer: Self-pay | Admitting: Family Medicine

## 2020-05-19 ENCOUNTER — Ambulatory Visit (INDEPENDENT_AMBULATORY_CARE_PROVIDER_SITE_OTHER): Payer: Medicare HMO | Admitting: Family Medicine

## 2020-05-19 ENCOUNTER — Other Ambulatory Visit: Payer: Self-pay

## 2020-05-19 VITALS — BP 134/80 | HR 88 | Temp 98.1°F | Resp 16 | Ht 63.0 in | Wt 207.1 lb

## 2020-05-19 DIAGNOSIS — I739 Peripheral vascular disease, unspecified: Secondary | ICD-10-CM | POA: Diagnosis not present

## 2020-05-19 DIAGNOSIS — Z6836 Body mass index (BMI) 36.0-36.9, adult: Secondary | ICD-10-CM

## 2020-05-19 DIAGNOSIS — E785 Hyperlipidemia, unspecified: Secondary | ICD-10-CM

## 2020-05-19 DIAGNOSIS — I10 Essential (primary) hypertension: Secondary | ICD-10-CM

## 2020-05-19 NOTE — Progress Notes (Signed)
HPI: [name] is a 74 y.o. female, who is here today with her daughter for chronic disease management. She was last seen on 02/18/20.  While she was doing PT, there were concerns about elevated BP: 188/120 and 188/110  On 02/26/20 Benazepril 10 mg was added. She is also on Amlodipine 10 mg daily. Home BP readings 130's/80's. Denies severe/frequent headache, visual changes, chest pain, dyspnea, palpitation,or worsing edema.  Health insurance performed PAD screening,which was abnormal. She denies LE pain or other claudication like symptoms when she walks. ABI done on 05/07/20: Right: Resting right ankle-brachial index indicates mild right lower extremity arterial disease. The right toe-brachial index is abnormal. RT great toe pressure = 129 mmHg.   Left: Resting left ankle-brachial index indicates mild left lower extremity arterial disease. The left toe-brachial index is abnormal. LT Great toe pressure = 118 mmHg.   She has LE weakness, stable,mildly improved with PT. Slurred speech. She is following with neurologist. Cervical MRI was ordered, she has not received appt information.  Negative for saddle anesthesia or changes in bowel/bladder function.  HLD:She is on Atorvastatin 20 mg daily. Tolerating medication well.  Lab Results  Component Value Date   CHOL 239 (H) 02/18/2020   HDL 46.80 02/18/2020   LDLCALC 175 (H) 02/18/2020   TRIG 86.0 02/18/2020   CHOLHDL 5 02/18/2020   She thinks she follows a healthful diet. She is not exercising regularly. Her daughter cooks.   Review of Systems  Constitutional: Positive for fatigue. Negative for activity change, appetite change and fever.  HENT: Negative for mouth sores, nosebleeds and sore throat.   Eyes: Negative for pain and redness.  Respiratory: Negative for cough and wheezing.   Gastrointestinal: Negative for abdominal pain, nausea and vomiting.       Negative for changes in bowel habits.    Genitourinary: Negative for decreased urine volume, dysuria and hematuria.  Neurological: Negative for syncope and facial asymmetry.  Rest of ROS, see pertinent positives sand negatives in HPI  Current Outpatient Medications on File Prior to Visit  Medication Sig Dispense Refill  . amLODipine (NORVASC) 10 MG tablet Take 1 tablet (10 mg total) by mouth daily. 90 tablet 1  . Ascorbic Acid (VITAMIN C) 1000 MG tablet Take 500 mg by mouth daily.    Marland Kitchen atorvastatin (LIPITOR) 20 MG tablet Take 1 tablet (20 mg total) by mouth daily. 90 tablet 3  . benazepril (LOTENSIN) 10 MG tablet Take 1 tablet (10 mg total) by mouth daily. 30 tablet 1  . cholecalciferol (VITAMIN D3) 25 MCG (1000 UNIT) tablet Take 1,000 Units by mouth daily.    Marland Kitchen co-enzyme Q-10 30 MG capsule Take 30 mg by mouth daily.     No current facility-administered medications on file prior to visit.   Past Medical History:  Diagnosis Date  . Hypertension    No Known Allergies  Social History   Socioeconomic History  . Marital status: Widowed    Spouse name: Not on file  . Number of children: Not on file  . Years of education: Not on file  . Highest education level: Not on file  Occupational History  . Not on file  Tobacco Use  . Smoking status: Never Smoker  . Smokeless tobacco: Never Used  Vaping Use  . Vaping Use: Never used  Substance and Sexual Activity  . Alcohol use: No  . Drug use: No  . Sexual activity: Not Currently  Other Topics Concern  . Not on  file  Social History Narrative   Right handed   Lives daughter in a one story home   Social Determinants of Health   Financial Resource Strain:   . Difficulty of Paying Living Expenses:   Food Insecurity:   . Worried About Charity fundraiser in the Last Year:   . Arboriculturist in the Last Year:   Transportation Needs:   . Film/video editor (Medical):   Marland Kitchen Lack of Transportation (Non-Medical):   Physical Activity:   . Days of Exercise per Week:   .  Minutes of Exercise per Session:   Stress:   . Feeling of Stress :   Social Connections:   . Frequency of Communication with Friends and Family:   . Frequency of Social Gatherings with Friends and Family:   . Attends Religious Services:   . Active Member of Clubs or Organizations:   . Attends Archivist Meetings:   Marland Kitchen Marital Status:     Vitals:   05/19/20 1424  BP: 134/80  Pulse: 88  Resp: 16  Temp: 98.1 F (36.7 C)  SpO2: 98%   Wt Readings from Last 3 Encounters:  05/19/20 207 lb 2 oz (94 kg)  03/29/20 206 lb 6.4 oz (93.6 kg)  02/18/20 205 lb 9.6 oz (93.3 kg)    Body mass index is 36.69 kg/m.  Physical Exam Vitals and nursing note reviewed.  Constitutional:      General: She is not in acute distress.    Appearance: She is well-developed.  HENT:     Head: Normocephalic and atraumatic.     Mouth/Throat:     Mouth: Mucous membranes are moist.     Pharynx: Oropharynx is clear.  Eyes:     Conjunctiva/sclera: Conjunctivae normal.     Pupils: Pupils are equal, round, and reactive to light.  Cardiovascular:     Rate and Rhythm: Normal rate and regular rhythm.     Heart sounds: No murmur heard.      Comments: DP and PT present bilateral. Pulmonary:     Effort: Pulmonary effort is normal. No respiratory distress.     Breath sounds: Normal breath sounds.  Abdominal:     Palpations: Abdomen is soft. There is no hepatomegaly or mass.     Tenderness: There is no abdominal tenderness.  Musculoskeletal:     Comments: 1+ LE pitting edema, bilateral.  Lymphadenopathy:     Cervical: No cervical adenopathy.  Skin:    General: Skin is warm.     Findings: No erythema or rash.  Neurological:     Mental Status: She is alert and oriented to person, place, and time.     Cranial Nerves: No cranial nerve deficit.     Comments: Unstable gait, not assisted.   Psychiatric:     Comments: Well groomed, good eye contact.    ASSESSMENT AND PLAN:  [name] was seen today for chronic disease management.  Orders Placed This Encounter  Procedures  . Comprehensive metabolic panel  . Lipid panel   1. PAD (peripheral artery disease) ([place]) We discussed ABI findings,prognosis,and treatment options. Aspirin 81 mg daily and continue Atorvastatin. Adequate LE skin care. Instructed about warning signs. - Lipid panel; Future  2. Hypertension, essential, benign Today BP adequately controlled. We discussed possible complication of elevated BP. Continue monitoring BP. Low salt diet.   3. Hyperlipidemia, unspecified hyperlipidemia type LDL goal < 70. Continue Atorvastatin 20 mg daily. Future lab appt will  be arranged.  - Comprehensive metabolic panel; Future  4. Body mass index (BMI) of 36.0-36.9 in adult Wt has been stable.  5. Morbid obesity ([place]) We discussed benefits of wt loss as well as adverse effects of obesity. Consistency with healthy diet and physical activity recommended. Daily walking as tolerated.  Return in about 4 months (around 09/19/2020) for HTN,prediabetes,HLD,wt.   Carlson Belland G. [name], MD  [facility]. [place] office.   A few things to remember from today's visit:   Cholesterol test will be scheduled. Continue monitoring blood pressure at home, goal is under 140/90. Low fat diet. No changes in Atorvastatin, I want bad cholesterol under 70. Bring blood pressure monitor with you next visit. If you need refills please call your pharmacy. Do not use My Chart to request refills or for acute issues that need immediate attention.    Please be sure medication list is accurate. If a new problem present, please set up appointment sooner than planned today.

## 2020-05-19 NOTE — Patient Instructions (Signed)
A few things to remember from today's visit:   Cholesterol test will be scheduled. Continue monitoring blood pressure at home, goal is under 140/90. Low fat diet. No changes in Atorvastatin, I want bad cholesterol under 70. Bring blood pressure monitor with you next visit. If you need refills please call your pharmacy. Do not use My Chart to request refills or for acute issues that need immediate attention.    Please be sure medication list is accurate. If a new problem present, please set up appointment sooner than planned today.

## 2020-05-28 ENCOUNTER — Other Ambulatory Visit: Payer: Medicare HMO

## 2020-05-28 ENCOUNTER — Other Ambulatory Visit: Payer: Self-pay

## 2020-05-28 DIAGNOSIS — E785 Hyperlipidemia, unspecified: Secondary | ICD-10-CM

## 2020-05-28 DIAGNOSIS — I10 Essential (primary) hypertension: Secondary | ICD-10-CM

## 2020-05-28 DIAGNOSIS — I739 Peripheral vascular disease, unspecified: Secondary | ICD-10-CM

## 2020-05-29 LAB — LIPID PANEL
Cholesterol: 180 mg/dL (ref <200)
HDL: 45 mg/dL — ABNORMAL LOW (ref >=50)
LDL Cholesterol (Calc): 118 mg/dL (calc) — ABNORMAL HIGH
Non-HDL Cholesterol (Calc): 135 mg/dL (calc) — ABNORMAL HIGH (ref <130)
Total CHOL/HDL Ratio: 4 (calc) (ref <5.0)
Triglycerides: 71 mg/dL (ref <150)

## 2020-05-29 LAB — COMPREHENSIVE METABOLIC PANEL
AG Ratio: 1.4 (calc) (ref 1.0–2.5)
ALT: 12 U/L (ref 6–29)
AST: 13 U/L (ref 10–35)
Albumin: 3.9 g/dL (ref 3.6–5.1)
Alkaline phosphatase (APISO): 72 U/L (ref 37–153)
BUN: 17 mg/dL (ref 7–25)
CO2: 27 mmol/L (ref 20–32)
Calcium: 9 mg/dL (ref 8.6–10.4)
Chloride: 105 mmol/L (ref 98–110)
Creat: 0.89 mg/dL (ref 0.60–0.93)
Globulin: 2.8 g/dL (calc) (ref 1.9–3.7)
Glucose, Bld: 98 mg/dL (ref 65–99)
Potassium: 3.9 mmol/L (ref 3.5–5.3)
Sodium: 140 mmol/L (ref 135–146)
Total Bilirubin: 0.8 mg/dL (ref 0.2–1.2)
Total Protein: 6.7 g/dL (ref 6.1–8.1)

## 2020-09-08 ENCOUNTER — Other Ambulatory Visit: Payer: Self-pay

## 2020-09-08 ENCOUNTER — Ambulatory Visit (INDEPENDENT_AMBULATORY_CARE_PROVIDER_SITE_OTHER): Payer: Medicare HMO | Admitting: Family Medicine

## 2020-09-08 ENCOUNTER — Encounter: Payer: Self-pay | Admitting: Family Medicine

## 2020-09-08 VITALS — BP 130/80 | HR 85 | Resp 16 | Ht 63.0 in | Wt 211.0 lb

## 2020-09-08 DIAGNOSIS — I10 Essential (primary) hypertension: Secondary | ICD-10-CM

## 2020-09-08 DIAGNOSIS — R7303 Prediabetes: Secondary | ICD-10-CM | POA: Diagnosis not present

## 2020-09-08 NOTE — Progress Notes (Signed)
HPI:  Sheila Perez is a 74 y.o. female, who is here today with her daughter for 3-4 months follow up.   She was last seen on 05/19/20.  Her daughter is concerned about her weight. She is not exercising regularly. According to her daughter, she does "not move" during the day. In general she follows a healthful diet, she snacks on fruit and nuts.  Her daughter cooks and she eats 1-2 times per day.  Prediabetes: Negative for polydipsia,polyuria, or polyphagia.  Lab Results  Component Value Date   HGBA1C 5.9 12/05/2019   HYN: She is on Amlodipine 10 mg daily and Benazepril 10 mg daily. PAD, she has no noted calves pain,cyanosis,or LE numbness. Negative for severe/frequent headache, visual changes, chest pain, dyspnea, palpitation, focal weakness, or edema. Takes Atorvastatin 20 mg daily.  Lab Results  Component Value Date   CREATININE 0.89 05/28/2020   BUN 17 05/28/2020   NA 140 05/28/2020   K 3.9 05/28/2020   CL 105 05/28/2020   CO2 27 05/28/2020   Review of Systems  Constitutional: Negative for activity change, appetite change, fatigue and fever.  HENT: Negative for mouth sores and nosebleeds.   Respiratory: Negative for cough and wheezing.   Gastrointestinal: Negative for abdominal pain, nausea and vomiting.       Negative for changes in bowel habits.  Genitourinary: Negative for decreased urine volume, dysuria and hematuria.  Musculoskeletal: Negative for gait problem and myalgias.  Neurological: Negative for syncope, facial asymmetry and weakness.  Psychiatric/Behavioral: Negative for confusion. The patient is not nervous/anxious.    Rest of ROS, see pertinent positives sand negatives in HPI  Current Outpatient Medications on File Prior to Visit  Medication Sig Dispense Refill  . amLODipine (NORVASC) 10 MG tablet Take 1 tablet (10 mg total) by mouth daily. 90 tablet 1  . Ascorbic Acid (VITAMIN C) 1000 MG tablet Take 500 mg by mouth daily.    Marland Kitchen  atorvastatin (LIPITOR) 20 MG tablet Take 1 tablet (20 mg total) by mouth daily. 90 tablet 3  . benazepril (LOTENSIN) 10 MG tablet Take 1 tablet (10 mg total) by mouth daily. 30 tablet 1  . cholecalciferol (VITAMIN D3) 25 MCG (1000 UNIT) tablet Take 1,000 Units by mouth daily.    Marland Kitchen co-enzyme Q-10 30 MG capsule Take 30 mg by mouth daily.     No current facility-administered medications on file prior to visit.     Past Medical History:  Diagnosis Date  . Hypertension    No Known Allergies  Social History   Socioeconomic History  . Marital status: Widowed    Spouse name: Not on file  . Number of children: Not on file  . Years of education: Not on file  . Highest education level: Not on file  Occupational History  . Not on file  Tobacco Use  . Smoking status: Never Smoker  . Smokeless tobacco: Never Used  Vaping Use  . Vaping Use: Never used  Substance and Sexual Activity  . Alcohol use: No  . Drug use: No  . Sexual activity: Not Currently  Other Topics Concern  . Not on file  Social History Narrative   Right handed   Lives daughter in a one story home   Social Determinants of Health   Financial Resource Strain:   . Difficulty of Paying Living Expenses: Not on file  Food Insecurity:   . Worried About Charity fundraiser in the Last Year: Not on file  .  Ran Out of Food in the Last Year: Not on file  Transportation Needs:   . Lack of Transportation (Medical): Not on file  . Lack of Transportation (Non-Medical): Not on file  Physical Activity:   . Days of Exercise per Week: Not on file  . Minutes of Exercise per Session: Not on file  Stress:   . Feeling of Stress : Not on file  Social Connections:   . Frequency of Communication with Friends and Family: Not on file  . Frequency of Social Gatherings with Friends and Family: Not on file  . Attends Religious Services: Not on file  . Active Member of Clubs or Organizations: Not on file  . Attends Archivist  Meetings: Not on file  . Marital Status: Not on file   Vitals:   09/08/20 1544  BP: 130/80  Pulse: 85  Resp: 16  SpO2: 98%   Wt Readings from Last 3 Encounters:  09/08/20 211 lb (95.7 kg)  05/19/20 207 lb 2 oz (94 kg)  03/29/20 206 lb 6.4 oz (93.6 kg)   Body mass index is 37.38 kg/m.  Physical Exam Vitals and nursing note reviewed.  Constitutional:      General: She is not in acute distress.    Appearance: She is well-developed.  HENT:     Head: Normocephalic and atraumatic.     Mouth/Throat:     Mouth: Mucous membranes are moist.     Pharynx: Oropharynx is clear.  Eyes:     Conjunctiva/sclera: Conjunctivae normal.  Cardiovascular:     Rate and Rhythm: Normal rate and regular rhythm.     Heart sounds: No murmur heard.   Pulmonary:     Effort: Pulmonary effort is normal. No respiratory distress.     Breath sounds: Normal breath sounds.  Abdominal:     Palpations: Abdomen is soft. There is no mass.     Tenderness: There is no abdominal tenderness.  Lymphadenopathy:     Cervical: No cervical adenopathy.  Skin:    General: Skin is warm.     Findings: No erythema or rash.  Neurological:     Mental Status: She is alert and oriented to person, place, and time.     Cranial Nerves: No cranial nerve deficit.     Gait: Gait normal.  Psychiatric:     Comments: Well groomed, good eye contact.   ASSESSMENT AND PLAN:  [name] was seen today for 3-4 months follow-up.  Orders Placed This Encounter  Procedures  . BASIC METABOLIC PANEL WITH GFR  . Hemoglobin A1c   Lab Results  Component Value Date   CREATININE 1.00 (H) 09/08/2020   BUN 16 09/08/2020   NA 138 09/08/2020   K 4.0 09/08/2020   CL 103 09/08/2020   CO2 24 09/08/2020   Lab Results  Component Value Date   HGBA1C 6.0 (H) 09/08/2020   Hypertension, essential, benign BP adequately controlled. Continue Benazepril 10 mg and Amlodipine 10 mg. Low salt diet. Monitor BP  periodically.  Prediabetes We discussed the importance of having a healthier life style for primary prevention of diabetes.  Morbid obesity ([place]) Gained about 4 Lb since her last visit. We discussed benefits of wt loss as well as adverse effects of obesity. Consistency with healthy diet and physical activity recommended. She is willing to walk in place during commercials when watching her 2 favorite shows.   Return in about 6 months (around 03/09/2021) for HTN,WT,HLD.   Roston Grunewald G. [name],  MD  [facility]. [place] office.   A few things to remember from today's visit:   Hypertension, essential, benign - Plan: BASIC METABOLIC PANEL WITH GFR  Prediabetes - Plan: Hemoglobin A1c  So as we discussed, you are going to walk in place during commercials of the 2 shows you watch daily.  If you need refills please call your pharmacy. Do not use My Chart to request refills or for acute issues that need immediate attention.    Please be sure medication list is accurate. If a new problem present, please set up appointment sooner than planned today.

## 2020-09-08 NOTE — Patient Instructions (Signed)
A few things to remember from today's visit:   Hypertension, essential, benign - Plan: BASIC METABOLIC PANEL WITH GFR  Prediabetes - Plan: Hemoglobin A1c  So as we discussed, you are going to walk in place during commercials of the 2 shows you watch daily.  If you need refills please call your pharmacy. Do not use My Chart to request refills or for acute issues that need immediate attention.    Please be sure medication list is accurate. If a new problem present, please set up appointment sooner than planned today.

## 2020-09-09 ENCOUNTER — Encounter: Payer: Self-pay | Admitting: Family Medicine

## 2020-09-09 LAB — BASIC METABOLIC PANEL WITH GFR
BUN/Creatinine Ratio: 16 (calc) (ref 6–22)
BUN: 16 mg/dL (ref 7–25)
CO2: 24 mmol/L (ref 20–32)
Calcium: 8.9 mg/dL (ref 8.6–10.4)
Chloride: 103 mmol/L (ref 98–110)
Creat: 1 mg/dL — ABNORMAL HIGH (ref 0.60–0.93)
GFR, Est African American: 66 mL/min/{1.73_m2} (ref >=60)
GFR, Est Non African American: 57 mL/min/{1.73_m2} — ABNORMAL LOW (ref >=60)
Glucose, Bld: 87 mg/dL (ref 65–99)
Potassium: 4 mmol/L (ref 3.5–5.3)
Sodium: 138 mmol/L (ref 135–146)

## 2020-09-09 LAB — HEMOGLOBIN A1C
Hgb A1c MFr Bld: 6 % of total Hgb — ABNORMAL HIGH (ref <5.7)
Mean Plasma Glucose: 126 (calc)
eAG (mmol/L): 7 (calc)

## 2020-09-20 ENCOUNTER — Ambulatory Visit: Payer: Medicare HMO | Admitting: Family Medicine

## 2020-10-29 ENCOUNTER — Telehealth: Payer: Self-pay | Admitting: Family Medicine

## 2020-10-29 NOTE — Telephone Encounter (Signed)
Left message for patient to call back and schedule Medicare Annual Wellness Visit (AWV) either virtually or in office.   Last AWV no information please schedule at anytime with [facility] Nurse Health Advisor 1 or 2   This should be a 45 minute visit. 

## 2021-01-06 ENCOUNTER — Other Ambulatory Visit: Payer: Self-pay

## 2021-01-06 ENCOUNTER — Telehealth (INDEPENDENT_AMBULATORY_CARE_PROVIDER_SITE_OTHER): Payer: Medicare HMO | Admitting: Internal Medicine

## 2021-01-06 ENCOUNTER — Encounter: Payer: Self-pay | Admitting: Internal Medicine

## 2021-01-06 DIAGNOSIS — R519 Headache, unspecified: Secondary | ICD-10-CM

## 2021-01-06 NOTE — Progress Notes (Signed)
Virtual Visit via Telephone Note  I connected with@ on 01/06/21 at  8:30 AM EST by telephone and verified that I am speaking with the correct person using two identifiers.   I discussed the limitations, risks, security and privacy concerns of performing an evaluation and management service by telephone and the limited availability of in person appointments. tThere may be a patient responsible charge related to this service. The patient expressed understanding and agreed to proceed.  Location patient: home Location provider:  home office Participants present for the call: patient, provider Patient did not have a visit in the prior 7 days to address this/these issue(s).   History of Present Illness: [place]  Acute sda  PCP appt NA Onset yesterday morning when got out of bed to go to the bathroom and had vomiting.  Which is since subsided.  However she also had the onset of a headache on the left side that is persisted came in never really went away since then no more nausea or vomiting was able to eat cream of wheat today no vision change neurologic changes. She rates it as a 5 out of 10 No past history of migraine or ongoing recurrent headaches. She has a history of hypertension taking her medicine has not checked her readings recently but it has been controlled recently. No history of stroke or similar according to her. She did take an aspirin once no other mitigating parameters. No fever chills cough diarrhea sinus symptoms.  no head injury fall on head  Observations/Objective: Patient sounds personable and well on the phone.  Monotone speech but normal cognition and interaction and articulation I do not appreciate any SOB. Speech and thought processing are grossly intact. Patient reported vitals: Lab Results  Component Value Date   WBC 7.7 01/26/2020   HGB 14.3 01/26/2020   HCT 42.0 01/26/2020   PLT 218 01/26/2020   GLUCOSE 87 09/08/2020   CHOL 180 05/28/2020    TRIG 71 05/28/2020   HDL 45 (L) 05/28/2020   LDLCALC 118 (H) 05/28/2020   ALT 12 05/28/2020   AST 13 05/28/2020   NA 138 09/08/2020   K 4.0 09/08/2020   CL 103 09/08/2020   CREATININE 1.00 (H) 09/08/2020   BUN 16 09/08/2020   CO2 24 09/08/2020   INR 1.1 01/26/2020   HGBA1C 6.0 (H) 09/08/2020    Assessment and Plan:  New onset headache with episode of vomiting at initial with no other associated symptoms.  Unilateral: sounds like a migraine but no past history of such no acute neurologic signs.  But concerning because of her age.  She does not have vertigo or persistent vomiting GI symptoms.  Or visual changes. Plan to check her blood pressure to make sure it is not extremely high Can take extra strength Tylenol if persistent progressive severe seek emergency room care or if any neurologic signs otherwise follow-up with Dr. [name] next week about her headache. Follow Up Instructions:  See above    [id number] 5-10 [id number] 11-20 [id number] 21-30 I did not refer this patient for an OV in the next 24 hours for this/these issue(s). FYI she said she never had a cva  But it is on her problem list !.   I discussed the assessment and treatment plan with the patient. The patient was provided an opportunity to ask questions and answered. The patient agreed with the plan and demonstrated an understanding of the instructions.   The patient was advised to call back or  seek an in-person evaluation if the symptoms worsen or if the condition fails to improve as anticipated.  I provided 16 minutes of non-face-to-face time during this encounter. Return for Dr [name] next week   to ed or urgent eval if  indicated as dicussed .  Shanon Ace, MD

## 2021-01-07 ENCOUNTER — Other Ambulatory Visit: Payer: Self-pay

## 2021-01-07 ENCOUNTER — Ambulatory Visit
Admission: RE | Admit: 2021-01-07 | Discharge: 2021-01-07 | Disposition: A | Discharge status: Home / Self Care | Payer: Medicare HMO | Source: Ambulatory Visit | Attending: Family Medicine | Admitting: Family Medicine

## 2021-01-07 ENCOUNTER — Encounter: Payer: Self-pay | Admitting: Family Medicine

## 2021-01-07 ENCOUNTER — Ambulatory Visit (INDEPENDENT_AMBULATORY_CARE_PROVIDER_SITE_OTHER): Payer: Medicare HMO | Admitting: Family Medicine

## 2021-01-07 VITALS — BP 130/80 | HR 95 | Resp 16 | Ht 63.0 in | Wt 209.2 lb

## 2021-01-07 DIAGNOSIS — R930 Abnormal findings on diagnostic imaging of skull and head, not elsewhere classified: Secondary | ICD-10-CM | POA: Diagnosis not present

## 2021-01-07 DIAGNOSIS — R7303 Prediabetes: Secondary | ICD-10-CM

## 2021-01-07 DIAGNOSIS — R519 Headache, unspecified: Secondary | ICD-10-CM | POA: Diagnosis not present

## 2021-01-07 DIAGNOSIS — I10 Essential (primary) hypertension: Secondary | ICD-10-CM

## 2021-01-07 IMAGING — CT CT HEAD W/O CM
1 series · 15 of 30 positions shown, 19 images · non-contrast
Comparison: MR head [DATE], CT head [DATE]

CLINICAL DATA: Headaches for 3 days Hx of maxillofacial surgery Hx
of strokes.

EXAM:
CT HEAD WITHOUT CONTRAST
TECHNIQUE: Contiguous axial images were obtained from the base of the skull
through the vertex without intravenous contrast.

[Series 2: head w/(date) · axial · 0.49mm/px · z∈[-178,-38]mm · 15 of 32 slices shown, 19 images]
[im 2/32  brain]
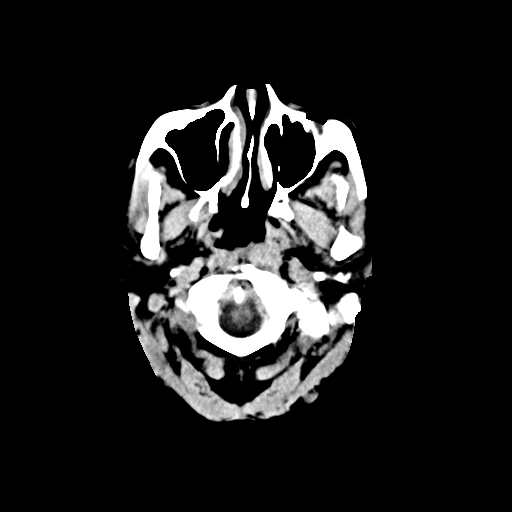
[im 2/32  bone]
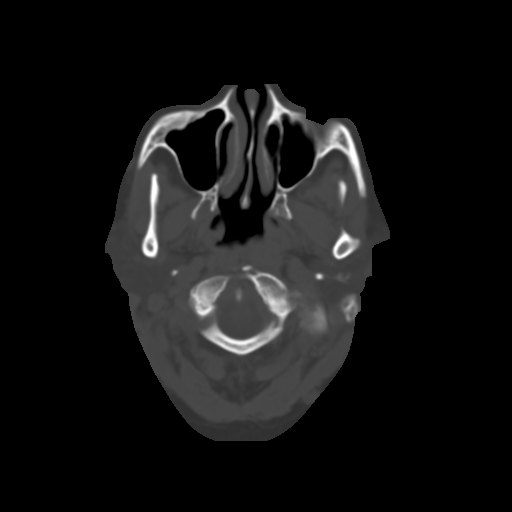
[im 4/32  brain]
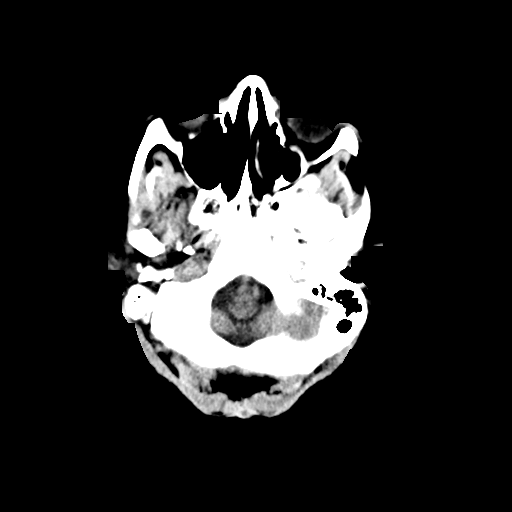
[im 6/32  brain]
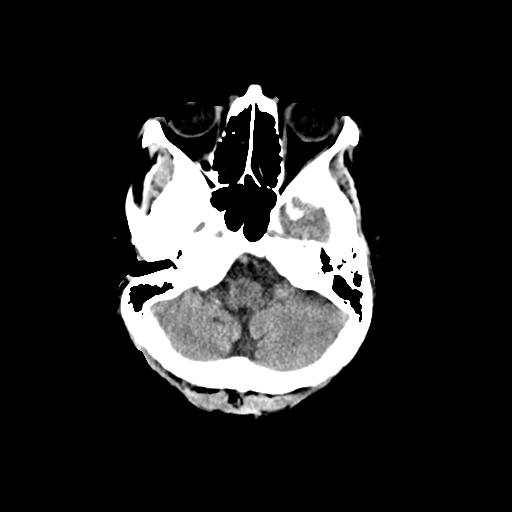
[im 8/32  brain]
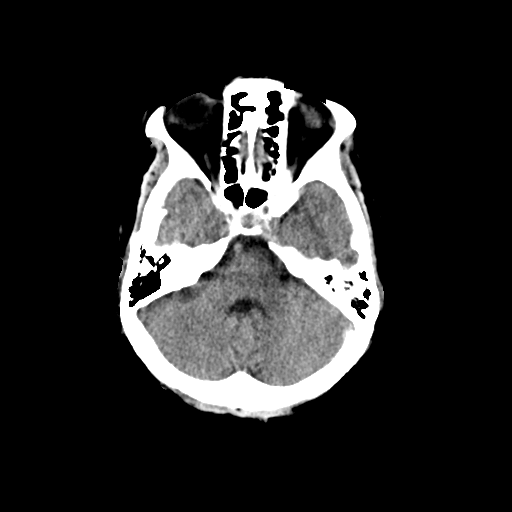
[im 10/32  brain]
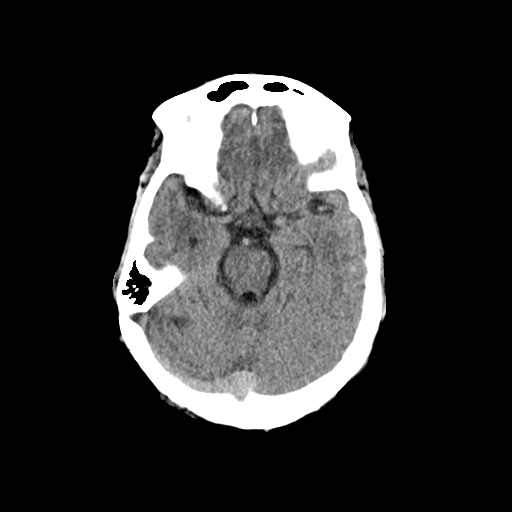
[im 10/32  bone]
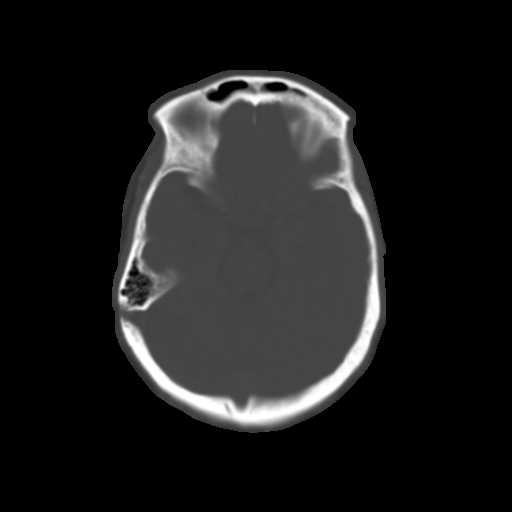
[im 12/32  brain]
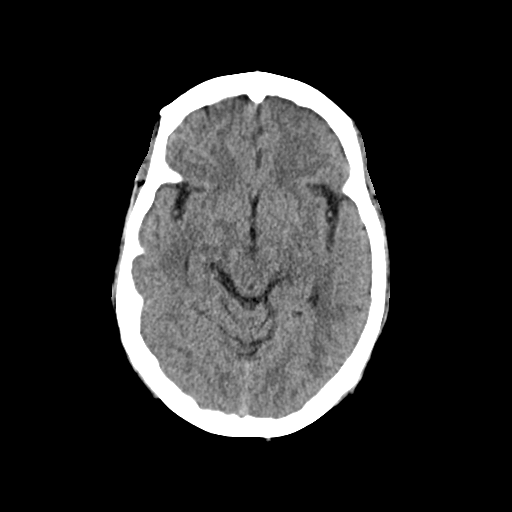
[im 14/32  brain]
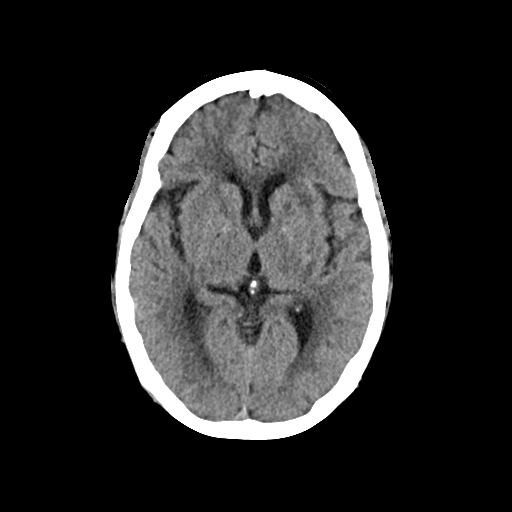
[im 17/32  brain]
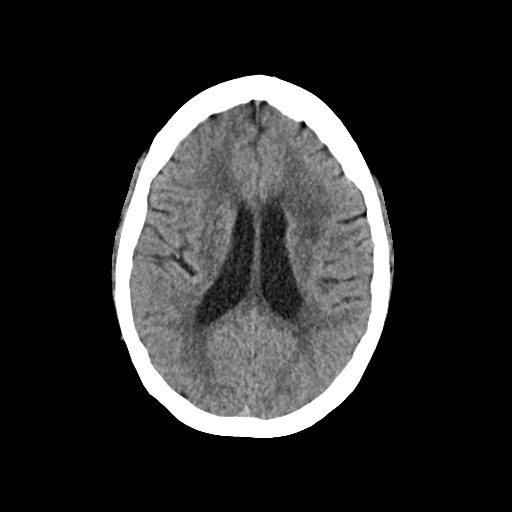
[im 18/32  brain]
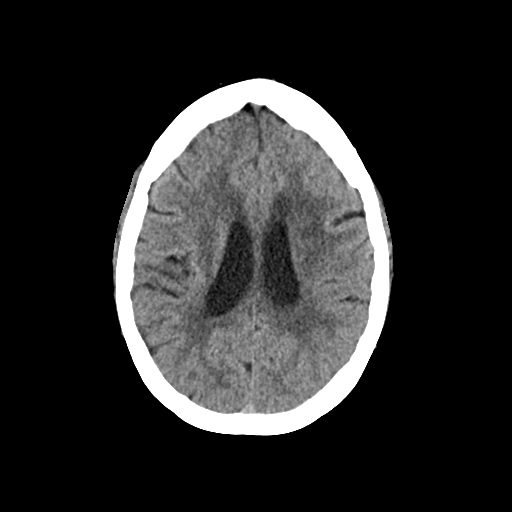
[im 18/32  bone]
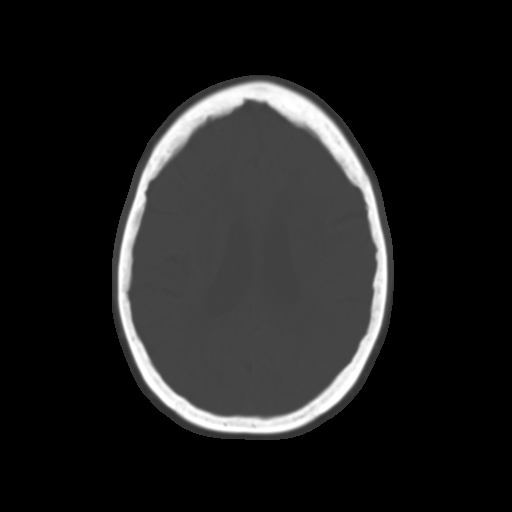
[im 20/32  brain]
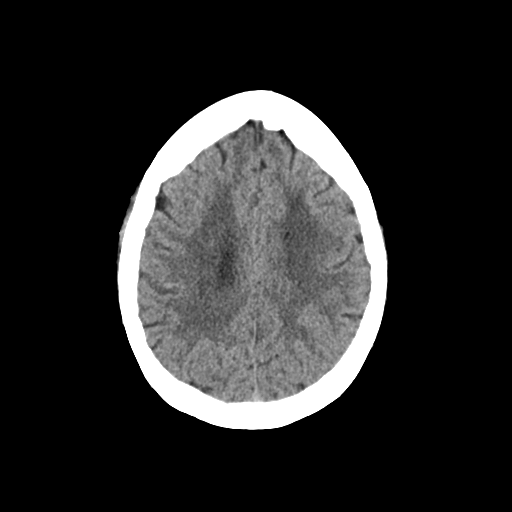
[im 22/32  brain]
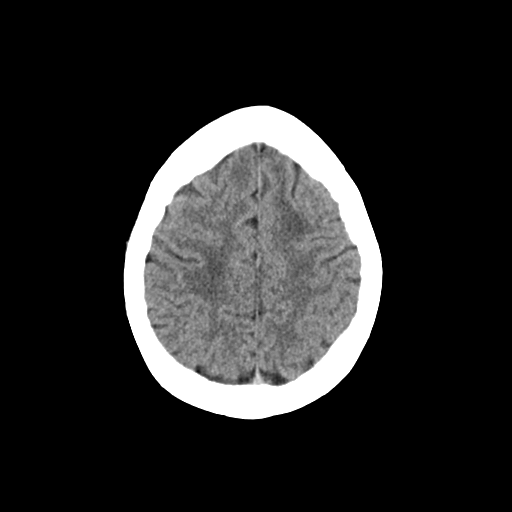
[im 24/32  brain]
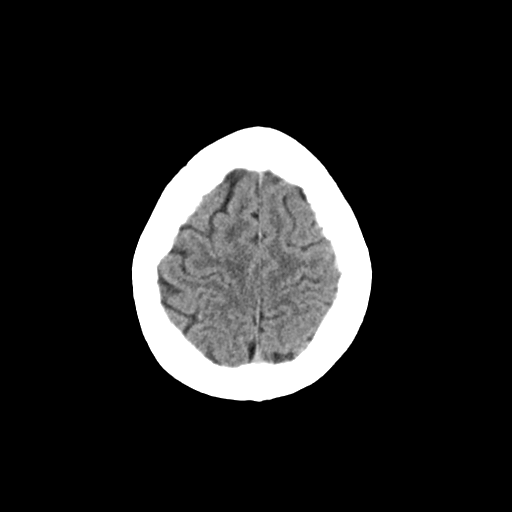
[im 26/32  brain]
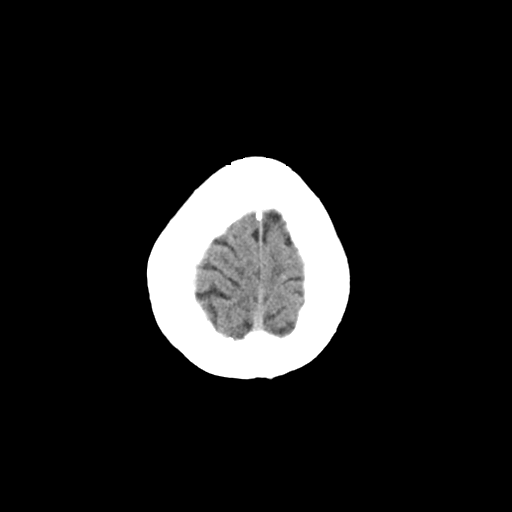
[im 26/32  bone]
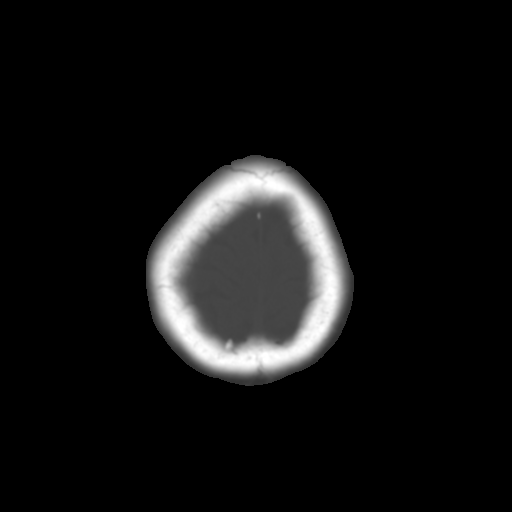
[im 28/32  brain]
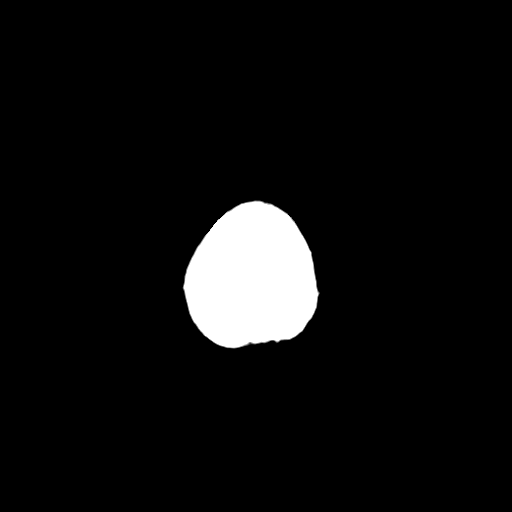
[im 30/32  brain]
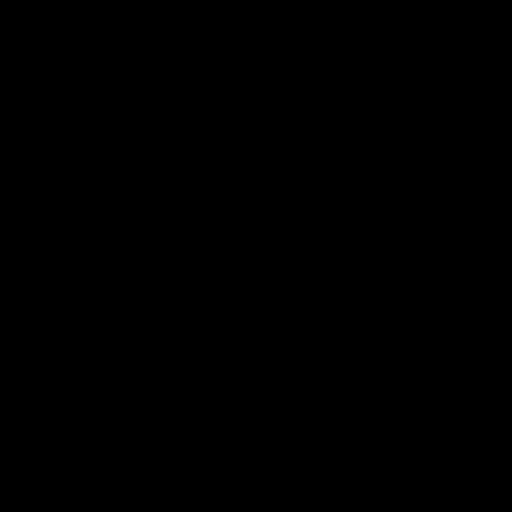

[15 of 30 positions shown; findings below may reference images not displayed]

FINDINGS: Brain:

Patchy and confluent areas of decreased attenuation are noted
throughout the deep and periventricular white matter of the cerebral
hemispheres bilaterally, compatible with chronic microvascular
ischemic disease.

Vague 4mm density within left temporal lobe of unclear etiology
([DATE], [DATE]).

No evidence of large-territorial acute infarction. No parenchymal
hemorrhage. No mass lesion. No extra-axial collection.

No mass effect or midline shift. No hydrocephalus. Basilar cisterns
are patent.

Vascular: No hyperdense vessel.

Skull: No acute fracture or focal lesion.

Sinuses/Orbits: Paranasal sinuses and mastoid air cells are clear.
The orbits are unremarkable.

Other: None.
IMPRESSION: Vague 4mm density within left temporal lobe of unclear etiology.
Consider repeat CT head in 6 hours to evaluate for stability in case
of a developing hemorrhage versus consider MRI brain for further
evaluation.

## 2021-01-07 NOTE — Patient Instructions (Signed)
Leaving for head CT. She is coming back for labs.

## 2021-01-07 NOTE — Progress Notes (Signed)
HPI: [name] is a 75 y.o. female, who is here today with her daughter for 6 months follow up.   She was last seen on 09/08/2020. Since her last visit she has been evaluated for new onset headache, yesterday. She denies any prior hx of headache. No recent trauma. No associated visual changes,nausea,vomiting,or MS changes. Headache is intermittent. "A little" of left fronto- temporal headache that started gain today around 11:30 Am, 5/10, she cannot described type of pain. Negative for fever,chills,chnages in appetite, abnormal wt loss,or night sweats. It seems to be mildly better after food intake.  She had brain MRI on 01/26/2020: 1. Motion degraded study without acute finding. 2. Advanced chronic small vessel disease. She follows with neurologist due to slurred speech, hyperreflexia, and unstable gait.  HTN: She is on Benazepril 10 mg daily and Amlodipine 10 mg daily.. Negative for chest pain, dyspnea, palpitation, claudication, focal weakness, or edema.  Lab Results  Component Value Date   CREATININE 1.00 (H) 09/08/2020   BUN 16 09/08/2020   NA 138 09/08/2020   K 4.0 09/08/2020   CL 103 09/08/2020   CO2 24 09/08/2020   Prediabetes: Negative for polydipsia,polyuria, or polyphagia. Last HgA1C 6.0 on 12/05/19. Her daughter would like HgA1C done today.  Her daughter is also requesting aid to help with ADL's. She lives with her daughter but she is working from home and sometimes she cannot help her. According to daughter, she called [employer] and she was told service will be covered, she just need a referral from PCP. She needs helps with grooming,showering,and transfer.  Review of Systems  Constitutional: Negative for activity change, appetite change and fever.  HENT: Negative for mouth sores, nosebleeds and sore throat.   Respiratory: Negative for cough and wheezing.   Gastrointestinal: Negative for abdominal pain.       Negative for changes in bowel  habits.  Genitourinary: Negative for decreased urine volume, dysuria and hematuria.  Musculoskeletal: Positive for gait problem. Negative for myalgias.  Neurological: Negative for syncope, facial asymmetry and weakness.  Psychiatric/Behavioral: Negative for behavioral problems and hallucinations.  Rest of ROS, see pertinent positives sand negatives in HPI  Current Outpatient Medications on File Prior to Visit  Medication Sig Dispense Refill  . amLODipine (NORVASC) 10 MG tablet Take 1 tablet (10 mg total) by mouth daily. 90 tablet 1  . Ascorbic Acid (VITAMIN C) 1000 MG tablet Take 500 mg by mouth daily.    Marland Kitchen atorvastatin (LIPITOR) 20 MG tablet Take 1 tablet (20 mg total) by mouth daily. 90 tablet 3  . benazepril (LOTENSIN) 10 MG tablet Take 1 tablet (10 mg total) by mouth daily. 30 tablet 1  . cholecalciferol (VITAMIN D3) 25 MCG (1000 UNIT) tablet Take 1,000 Units by mouth daily.    Marland Kitchen co-enzyme Q-10 30 MG capsule Take 30 mg by mouth daily.     No current facility-administered medications on file prior to visit.   Past Medical History:  Diagnosis Date  . Hypertension    No Known Allergies  Social History   Socioeconomic History  . Marital status: Widowed    Spouse name: Not on file  . Number of children: Not on file  . Years of education: Not on file  . Highest education level: Not on file  Occupational History  . Not on file  Tobacco Use  . Smoking status: Never Smoker  . Smokeless tobacco: Never Used  Vaping Use  . Vaping Use: Never used  Substance  and Sexual Activity  . Alcohol use: No  . Drug use: No  . Sexual activity: Not Currently  Other Topics Concern  . Not on file  Social History Narrative   Right handed   Lives daughter in a one story home   Social Determinants of Health   Financial Resource Strain: Not on file  Food Insecurity: Not on file  Transportation Needs: Not on file  Physical Activity: Not on file  Stress: Not on file  Social Connections: Not  on file   Vitals:   01/07/21 1355  BP: 130/80  Pulse: 95  Resp: 16  SpO2: 100%   Body mass index is 37.07 kg/m.  Physical Exam Vitals and nursing note reviewed.  Constitutional:      General: She is not in acute distress.    Appearance: She is well-developed.  HENT:     Head: Normocephalic and atraumatic.     Jaw: No tenderness.     Comments: No tenderness upon palpation of left temporal area. I do not appreciate temporal artery engorgement.    Mouth/Throat:     Mouth: Oropharynx is clear and moist and mucous membranes are normal.  Eyes:     Conjunctiva/sclera: Conjunctivae normal.     Pupils: Pupils are equal, round, and reactive to light.  Cardiovascular:     Rate and Rhythm: Normal rate and regular rhythm.     Pulses:          Dorsalis pedis pulses are 2+ on the right side and 2+ on the left side.     Heart sounds: No murmur heard.   Pulmonary:     Effort: Pulmonary effort is normal. No respiratory distress.     Breath sounds: Normal breath sounds.  Abdominal:     Palpations: Abdomen is soft. There is no hepatomegaly or mass.     Tenderness: There is no abdominal tenderness.  Musculoskeletal:        General: No edema.  Lymphadenopathy:     Cervical: No cervical adenopathy.  Skin:    General: Skin is warm.     Findings: No erythema or rash.  Neurological:     Mental Status: She is alert and oriented to person, place, and time.     Cranial Nerves: No cranial nerve deficit.     Deep Tendon Reflexes: Strength normal.     Comments: Mildly unstable gait, daughter helps with assistance.  Psychiatric:        Mood and Affect: Mood and affect normal.     Comments: Well groomed, good eye contact.   ASSESSMENT AND PLAN:  [name] was seen today for 6 months follow-up.  Orders Placed This Encounter  Procedures  . CT Head Wo Contrast  . C-reactive protein  . Sedimentation rate  . Basic metabolic panel  . Hemoglobin A1c  . CBC  . Ambulatory  referral to [facility]   Lab Results  Component Value Date   CREATININE 1.10 (H) 01/07/2021   BUN 19 01/07/2021   NA 144 01/07/2021   K 4.2 01/07/2021   CL 107 01/07/2021   CO2 23 01/07/2021   Lab Results  Component Value Date   HGBA1C 6.1 (H) 01/07/2021   Lab Results  Component Value Date   WBC 8.5 01/07/2021   HGB 14.4 01/07/2021   HCT 43.0 01/07/2021   MCV 91.9 01/07/2021   PLT 251 01/07/2021   Lab Results  Component Value Date   CRP 16.1 (H) 01/07/2021  Lab Results  Component Value Date   ESRSEDRATE 23 (H) 01/07/2021   New onset of headaches Examination today does not suggest a serious process but no hx of headaches and headache has not improved. Tylenol 500 mg daily if needed. Instructed about warning signs. Monitor for new symptoms. Today she was sent for head CT.  Hypertension, essential, benign BP adequately controlled. Continue Benazepril 10 mg daily and Amlodipine 10 mg daily. Monitor BP at home. Low salt diet.  Prediabetes Healthy life style recommended for primary prevention of diabetes. Consistency with low impact exercise and a healthful diet.  She is going to have her head CT now and some back for labs.  Return in about 4 months (around 05/07/2021) for HTN.   Sheila Summerhill G. [name], MD  [facility]. [place] office.

## 2021-01-08 LAB — BASIC METABOLIC PANEL
BUN/Creatinine Ratio: 17 (calc) (ref 6–22)
BUN: 19 mg/dL (ref 7–25)
CO2: 23 mmol/L (ref 20–32)
Calcium: 9 mg/dL (ref 8.6–10.4)
Chloride: 107 mmol/L (ref 98–110)
Creat: 1.1 mg/dL — ABNORMAL HIGH (ref 0.60–0.93)
Glucose, Bld: 89 mg/dL (ref 65–99)
Potassium: 4.2 mmol/L (ref 3.5–5.3)
Sodium: 144 mmol/L (ref 135–146)

## 2021-01-08 LAB — CBC
HCT: 43 % (ref 35.0–45.0)
Hemoglobin: 14.4 g/dL (ref 11.7–15.5)
MCH: 30.8 pg (ref 27.0–33.0)
MCHC: 33.5 g/dL (ref 32.0–36.0)
MCV: 91.9 fL (ref 80.0–100.0)
MPV: 10.6 fL (ref 7.5–12.5)
Platelets: 251 10*3/uL (ref 140–400)
RBC: 4.68 10*6/uL (ref 3.80–5.10)
RDW: 13.2 % (ref 11.0–15.0)
WBC: 8.5 10*3/uL (ref 3.8–10.8)

## 2021-01-08 LAB — HEMOGLOBIN A1C
Hgb A1c MFr Bld: 6.1 % of total Hgb — ABNORMAL HIGH (ref <5.7)
Mean Plasma Glucose: 128 mg/dL
eAG (mmol/L): 7.1 mmol/L

## 2021-01-08 LAB — SEDIMENTATION RATE: Sed Rate: 38 mm/h — ABNORMAL HIGH (ref 0–30)

## 2021-01-08 LAB — C-REACTIVE PROTEIN: CRP: 16.1 mg/L — ABNORMAL HIGH (ref <8.0)

## 2021-01-09 NOTE — Addendum Note (Signed)
Addended by: [name], Sims Laday G on: 01/09/2021 10:12 PM   Modules accepted: Orders

## 2021-01-12 ENCOUNTER — Ambulatory Visit
Admission: RE | Admit: 2021-01-12 | Discharge: 2021-01-12 | Disposition: A | Discharge status: Home / Self Care | Payer: Medicare HMO | Source: Ambulatory Visit | Attending: Family Medicine | Admitting: Family Medicine

## 2021-01-12 ENCOUNTER — Ambulatory Visit: Payer: Medicare HMO | Admitting: Family Medicine

## 2021-01-12 DIAGNOSIS — R930 Abnormal findings on diagnostic imaging of skull and head, not elsewhere classified: Secondary | ICD-10-CM

## 2021-01-12 DIAGNOSIS — G9389 Other specified disorders of brain: Secondary | ICD-10-CM | POA: Diagnosis not present

## 2021-01-12 DIAGNOSIS — R519 Headache, unspecified: Secondary | ICD-10-CM

## 2021-01-12 IMAGING — MR MR HEAD WO/W CM
12 series · 48 of 48 positions shown · IV contrast (multihance)
Comparison: Correlation made with CT head [DATE]

CLINICAL DATA: Possible abnormality on head CT

EXAM:
MRI HEAD WITHOUT AND WITH CONTRAST
TECHNIQUE: Multiplanar, multiecho pulse sequences of the brain and surrounding
structures were obtained without and with intravenous contrast.
CONTRAST:  19mL MULTIHANCE GADOBENATE DIMEGLUMINE 529 MG/ML IV SOLN

[Series 2: T1 · sagittal · 5.0mm · 0.45mm/px · 1 of 21 slices shown]
[im 1/21]
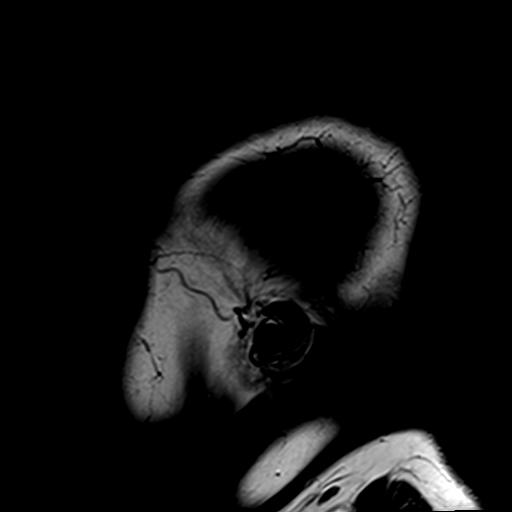

[Series 3: DWI · axial · 3.0mm · 1.80mm/px · z∈[-44,+103]mm · 7 of 100 slices shown (1 of 4)]
[im 1/100]
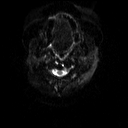
[im 17/100]
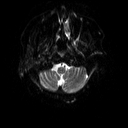
[im 34/100]
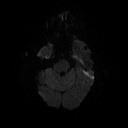
[im 50/100]
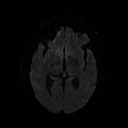
[im 67/100]
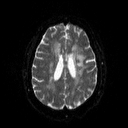
[im 83/100]
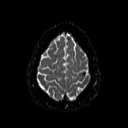
[im 100/100]
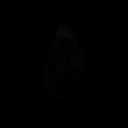

[Series 4: DWI · axial · 3.0mm · 1.80mm/px · z∈[-44,+103]mm · 3 of 48 slices shown (2 of 4)]
[im 1/48]
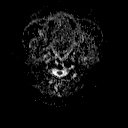
[im 24/48]
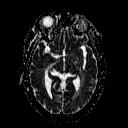
[im 48/48]
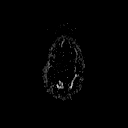

[Series 5: DWI · coronal · 5.0mm · 1.80mm/px · 5 of 68 slices shown (3 of 4)]
[im 1/68]
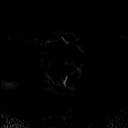
[im 17/68]
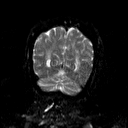
[im 34/68]
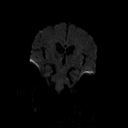
[im 51/68]
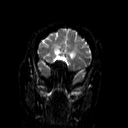
[im 68/68]
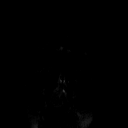

[Series 6: DWI · coronal · 5.0mm · 1.80mm/px · 2 of 34 slices shown (4 of 4)]
[im 1/34]
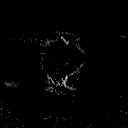
[im 34/34]
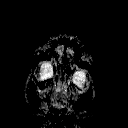

[Series 7: T2 · axial · 5.0mm · 0.60mm/px · z∈[-43,+98]mm · 2 of 22 slices shown (1 of 2)]
[im 1/22]
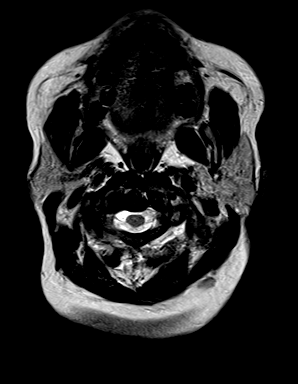
[im 22/22]
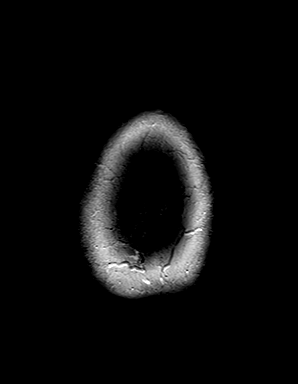

[Series 8: FLAIR · axial · 3.0mm · 0.45mm/px · z∈[-38,+96]mm · 2 of 30 slices shown]
[im 1/30]
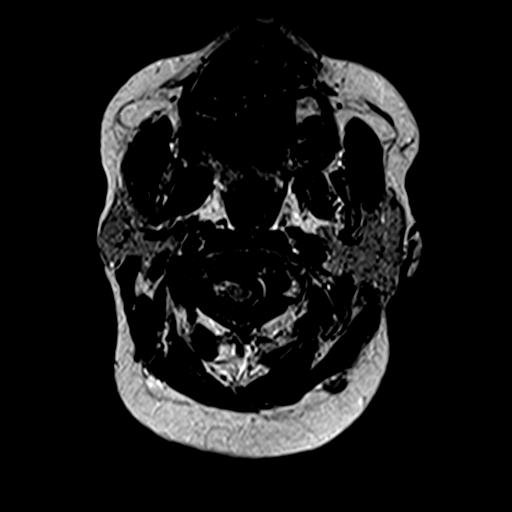
[im 30/30]
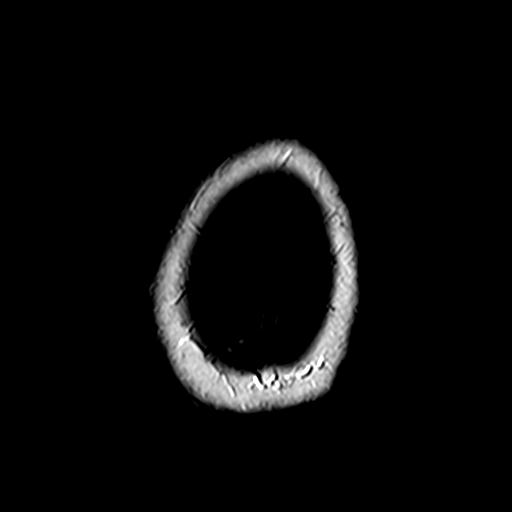

[Series 10: swi_images · axial · 4.0mm · 0.90mm/px · z∈[-41,+99]mm · 2 of 36 slices shown]
[im 1/36]
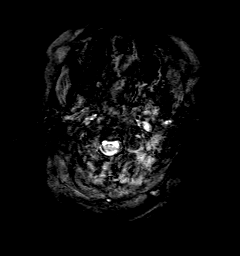
[im 36/36]
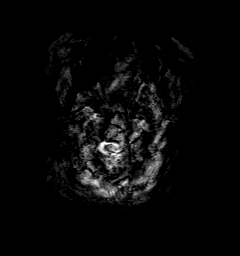

[Series 11: t1_mpr_tra · axial · 1.0mm · 0.75mm/px · z∈[-43,+99]mm · 10 of 144 slices shown]
[im 1/144]
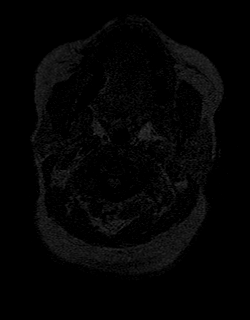
[im 16/144]
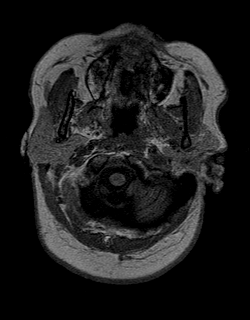
[im 32/144]
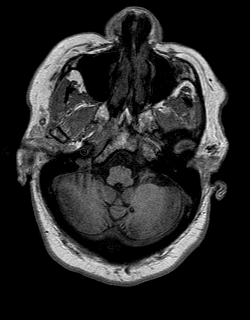
[im 48/144]
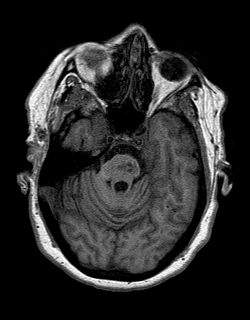
[im 64/144]
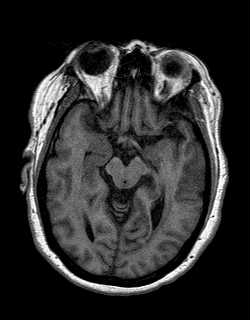
[im 80/144]
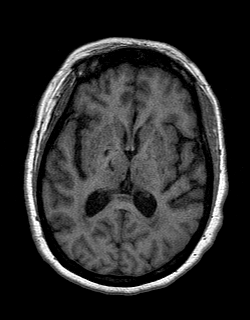
[im 96/144]
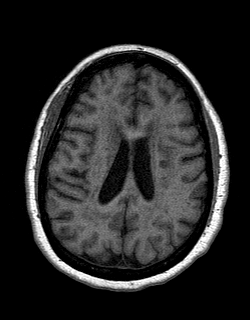
[im 112/144]
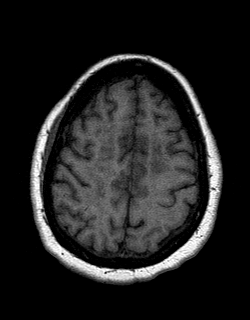
[im 128/144]
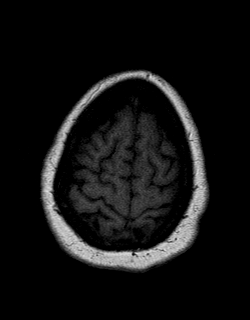
[im 144/144]
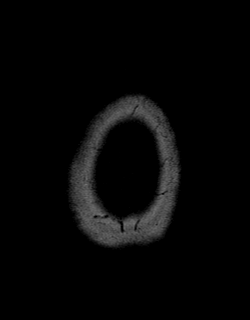

[Series 12: T2 · coronal · 5.0mm · 0.45mm/px · 2 of 25 slices shown (2 of 2)]
[im 1/25]
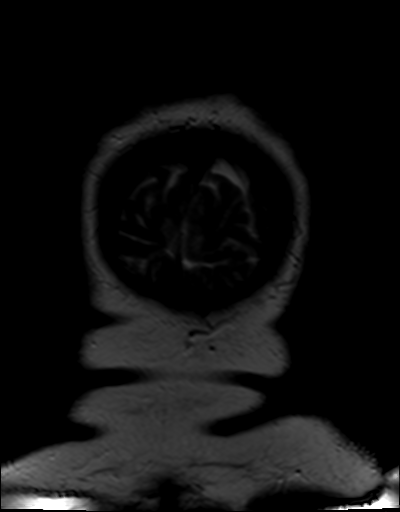
[im 25/25]
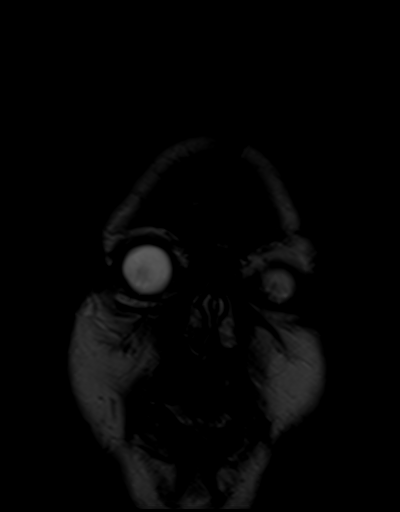

[Series 13: t1_mpr_tra post · axial · 1.0mm · 0.75mm/px · z∈[-43,+99]mm · 10 of 144 slices shown]
[im 1/144]
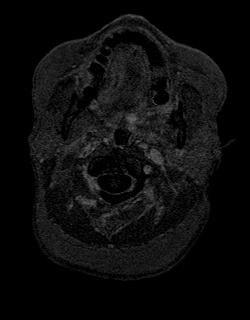
[im 16/144]
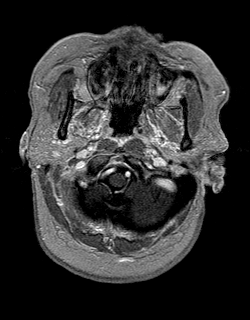
[im 32/144]
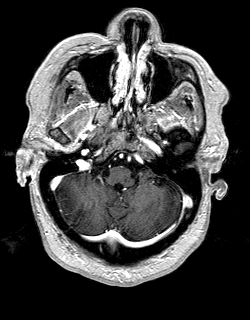
[im 48/144]
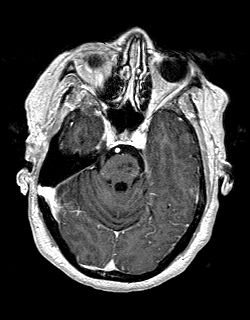
[im 64/144]
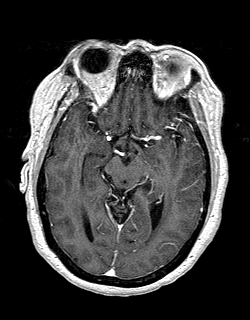
[im 80/144]
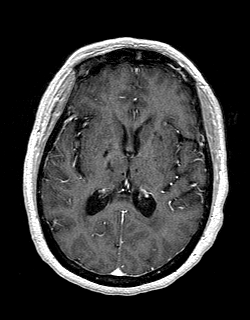
[im 96/144]
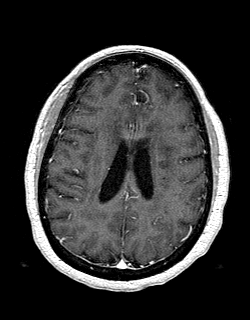
[im 112/144]
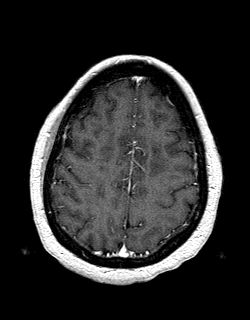
[im 128/144]
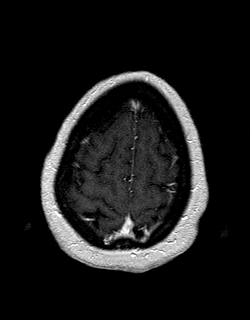
[im 144/144]
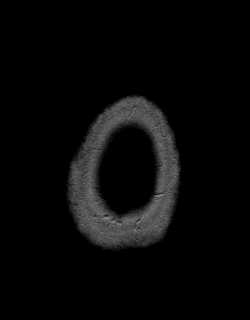

[Series 14: post cor · coronal · 5.0mm · 0.45mm/px · 2 of 25 slices shown]
[im 1/25]
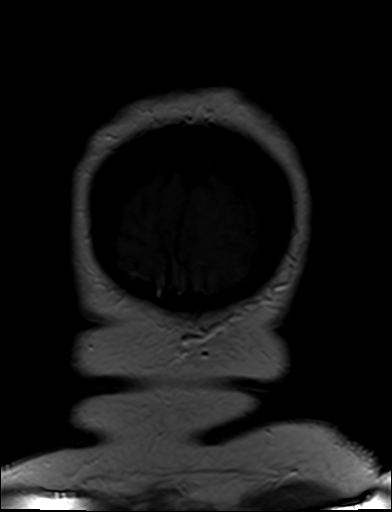
[im 25/25]
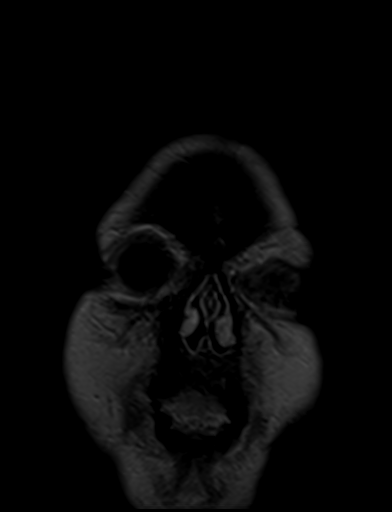

[48 of 48 positions shown; findings below may reference images not displayed]

FINDINGS: Motion artifact is present.

Brain: There is no acute infarction or intracranial hemorrhage.
There is no intracranial mass, mass effect, or edema. There is no
hydrocephalus or extra-axial fluid collection.

Prominence of the ventricles and sulci reflects mild generalized
parenchymal volume loss. Patchy and confluent areas of T2
hyperintensity in the supratentorial and pontine white matter are
nonspecific but probably reflect moderate to advanced chronic
microvascular ischemic changes. There are chronic small vessel
infarcts including involvement of the left basal ganglia, right
thalamus, left pons, and right cerebellum. Innumerable foci of
susceptibility hypointensity throughout the cerebral hemispheres
involving subcortical white matter and deep gray nuclei with
additional involvement of the brainstem and cerebellum.

No abnormal enhancement.

Vascular: Major vessel flow voids at the skull base are preserved.

Skull and upper cervical spine: Marrow signal within normal limits.
Degenerative changes of the cervical spine.

Sinuses/Orbits: Paranasal sinuses are aerated. Orbits are
unremarkable.

Other: Sella is unremarkable.  Mastoid air cells are clear.
IMPRESSION: Motion degraded.

Innumerable foci of susceptibility throughout likely reflecting
chronic microhemorrhages. May reflect sequelae of chronic
hypertension and/or amyloid angiopathy.

The questioned abnormality on CT is difficult to evaluate given
above and superimposed motion artifact. Could reflect an area of
punctate acute hemorrhage, mineralization, or artifact. Follow-up
head CT may be more helpful.

Moderate to advanced chronic microvascular ischemic changes.

## 2021-01-12 MED ORDER — GADOBENATE DIMEGLUMINE 529 MG/ML IV SOLN
19.0000 mL | Freq: Once | INTRAVENOUS | Status: AC | PRN
  Administered 2021-01-12: 19 mL via INTRAVENOUS

## 2021-01-14 ENCOUNTER — Other Ambulatory Visit: Payer: Self-pay | Admitting: Family Medicine

## 2021-01-14 DIAGNOSIS — R930 Abnormal findings on diagnostic imaging of skull and head, not elsewhere classified: Secondary | ICD-10-CM

## 2021-01-14 DIAGNOSIS — I680 Cerebral amyloid angiopathy: Secondary | ICD-10-CM

## 2021-01-14 NOTE — Addendum Note (Signed)
Addended by: Rodrigo Ran on: 01/14/2021 04:33 PM   Modules accepted: Orders

## 2021-01-21 ENCOUNTER — Ambulatory Visit
Admission: RE | Admit: 2021-01-21 | Discharge: 2021-01-21 | Disposition: A | Discharge status: Home / Self Care | Payer: Medicare HMO | Source: Ambulatory Visit | Attending: Family Medicine | Admitting: Family Medicine

## 2021-01-21 DIAGNOSIS — R930 Abnormal findings on diagnostic imaging of skull and head, not elsewhere classified: Secondary | ICD-10-CM

## 2021-01-21 DIAGNOSIS — G9389 Other specified disorders of brain: Secondary | ICD-10-CM | POA: Diagnosis not present

## 2021-01-21 DIAGNOSIS — R9082 White matter disease, unspecified: Secondary | ICD-10-CM | POA: Diagnosis not present

## 2021-01-21 IMAGING — CT CT HEAD W/O CM
4 series · 16 of 47 positions shown, 18 images · non-contrast
Comparison: Head CT [DATE]

CLINICAL DATA: Follow-up of head CT abnormality

EXAM:
CT HEAD WITHOUT CONTRAST
TECHNIQUE: Contiguous axial images were obtained from the base of the skull
through the vertex without intravenous contrast.

[Series 2: head 5.00 hr40 s3 axial ibhc · axial · 0.41mm/px · z∈[-709,-589]mm · 7 of 32 slices shown, 9 images]
[im 4/32  brain]
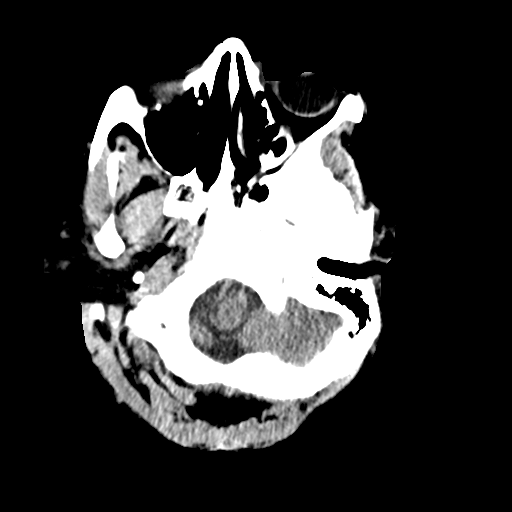
[im 4/32  bone]
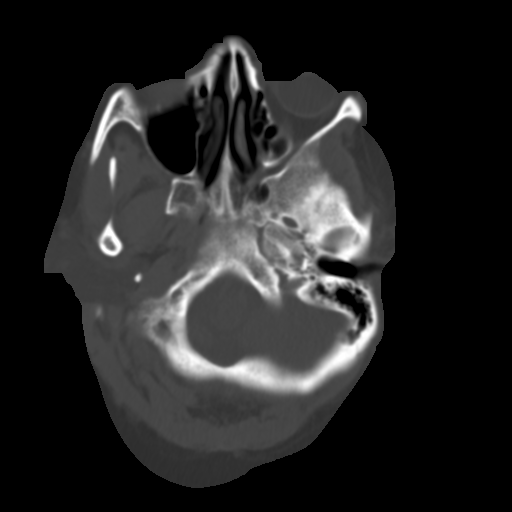
[im 8/32  brain]
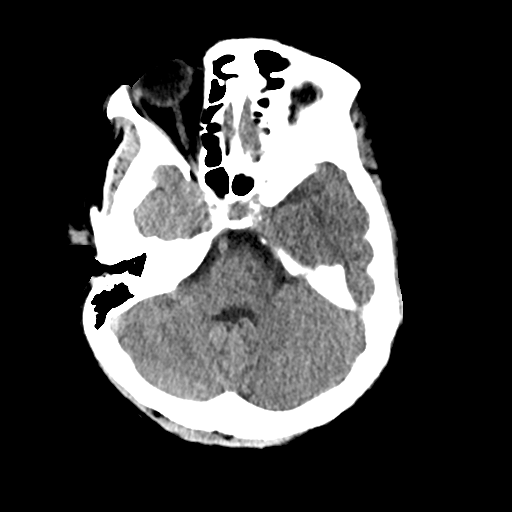
[im 12/32  brain]
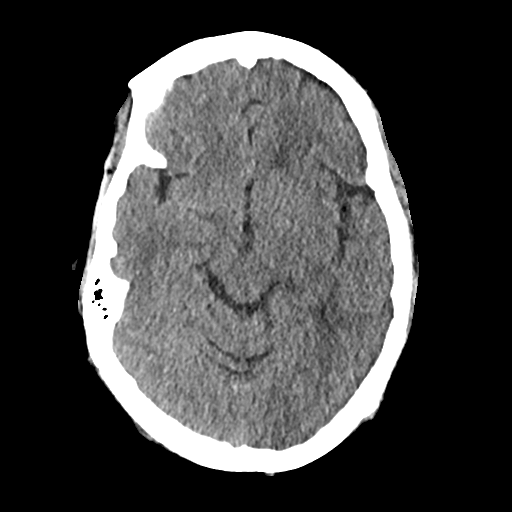
[im 16/32  brain]
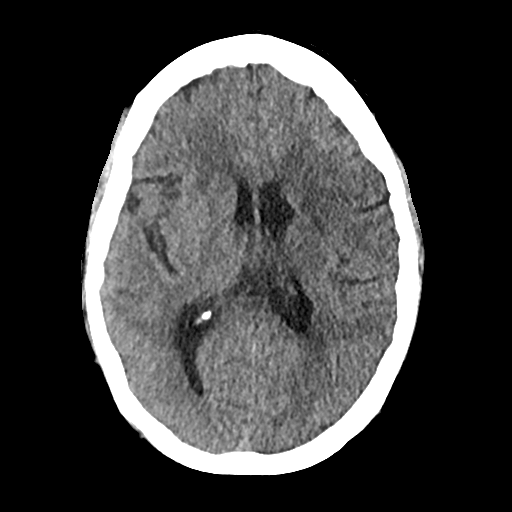
[im 20/32  brain]
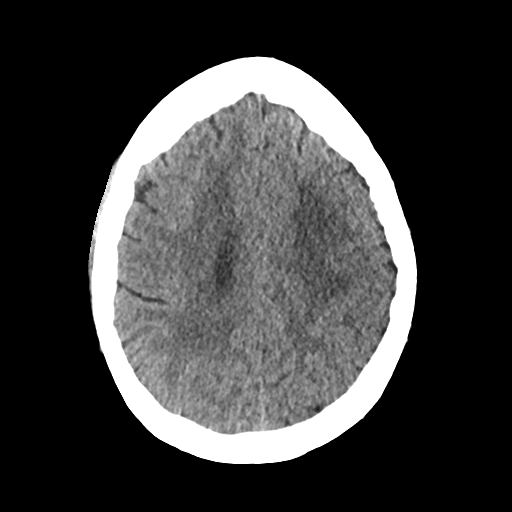
[im 20/32  bone]
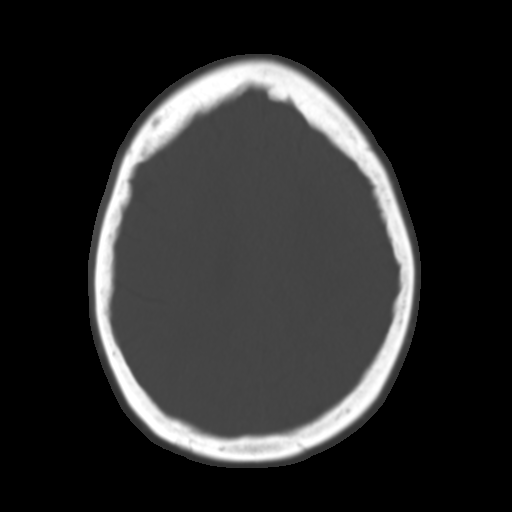
[im 24/32  brain]
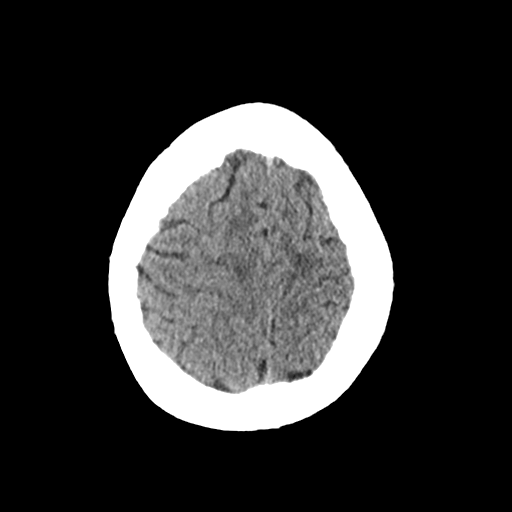
[im 28/32  brain]
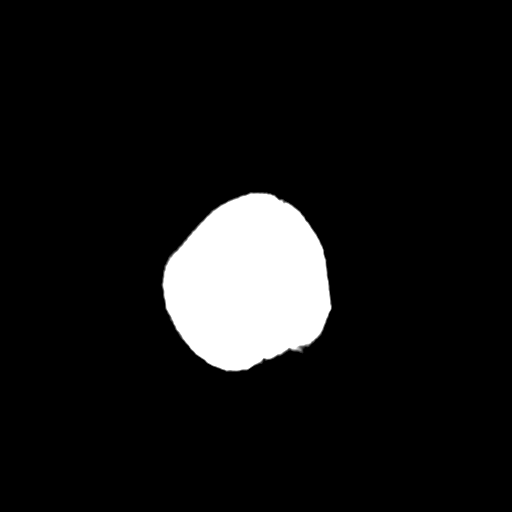

[Series 3: head 2.00 hr60 s3 axial bone · axial · 0.41mm/px · z∈[-711,-679]mm · 3 of 80 slices shown]
[im 8/80  bone]
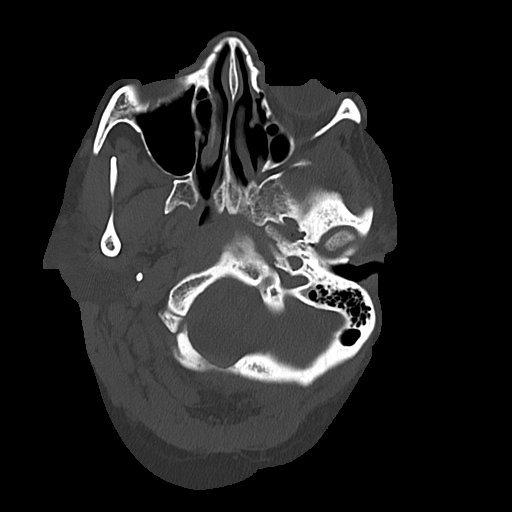
[im 16/80  bone]
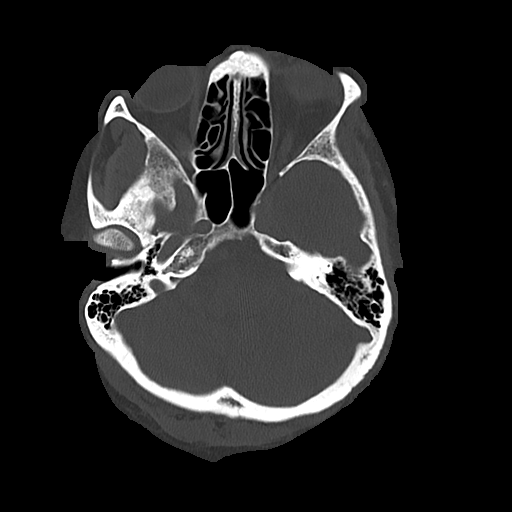
[im 24/80  bone]
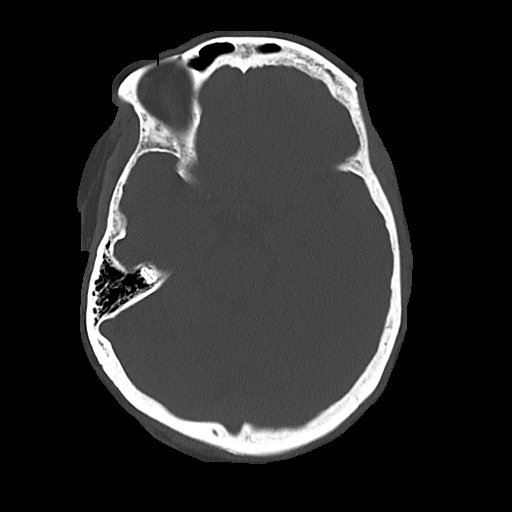

[Series 4: head 3.00 hr40 s3 sag · sagittal · 0.31mm/px · 3 of 69 slices shown]
[im 28/69  brain]
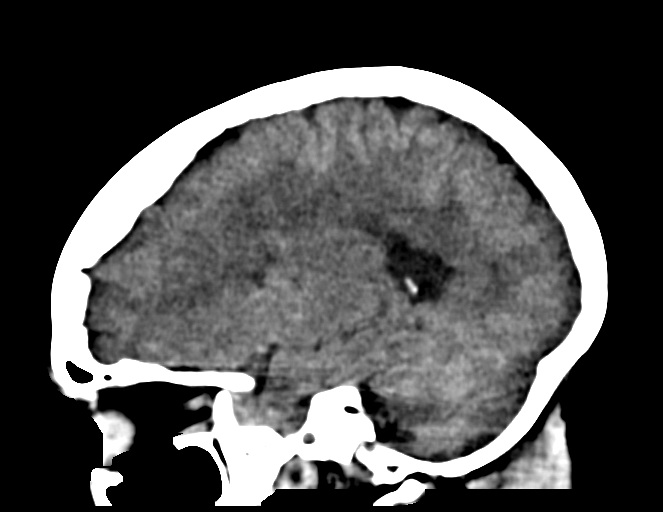
[im 35/69  brain]
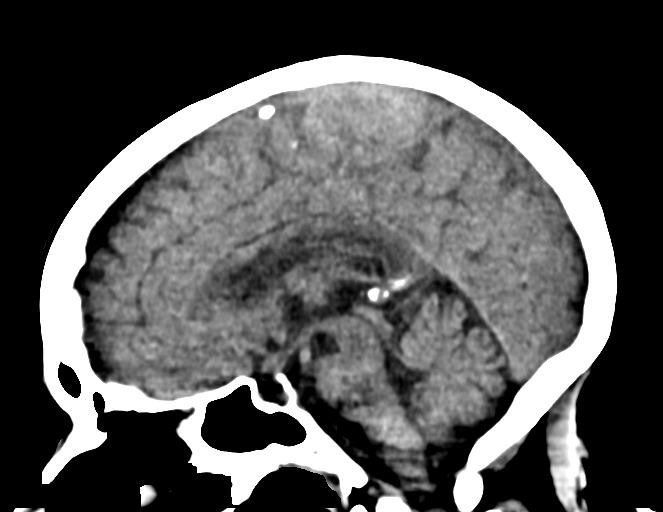
[im 41/69  brain]
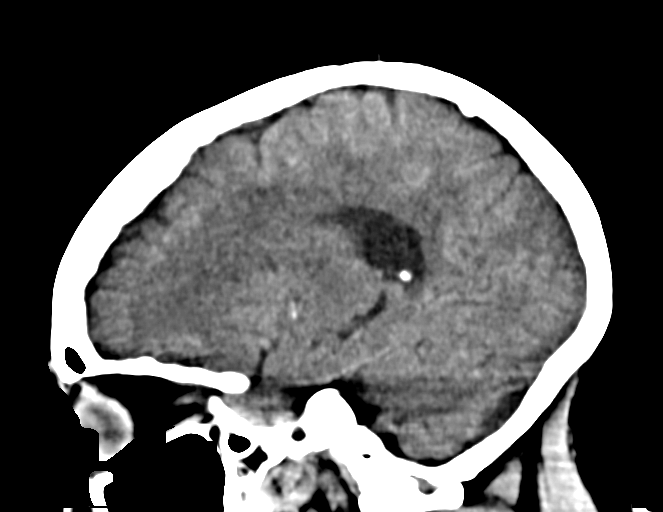

[Series 6: head 3.00 hr40 s3 cor · coronal · 0.31mm/px · 3 of 69 slices shown]
[im 23/69  brain]
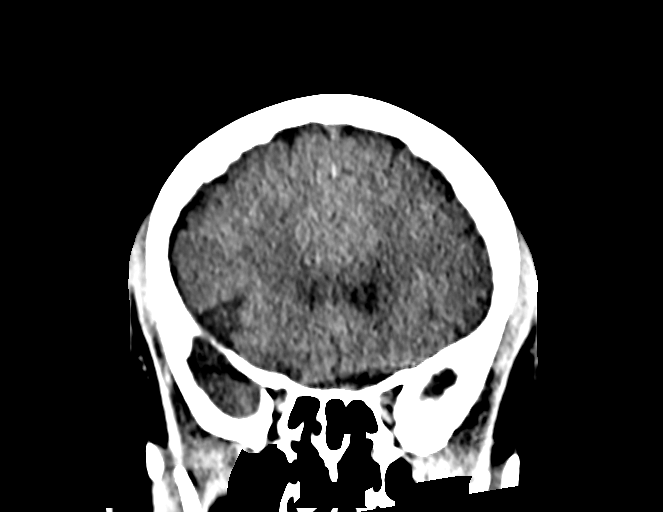
[im 31/69  brain]
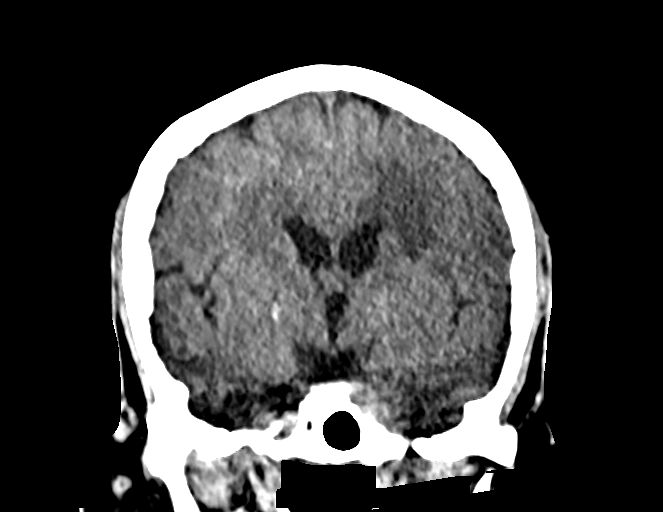
[im 38/69  brain]
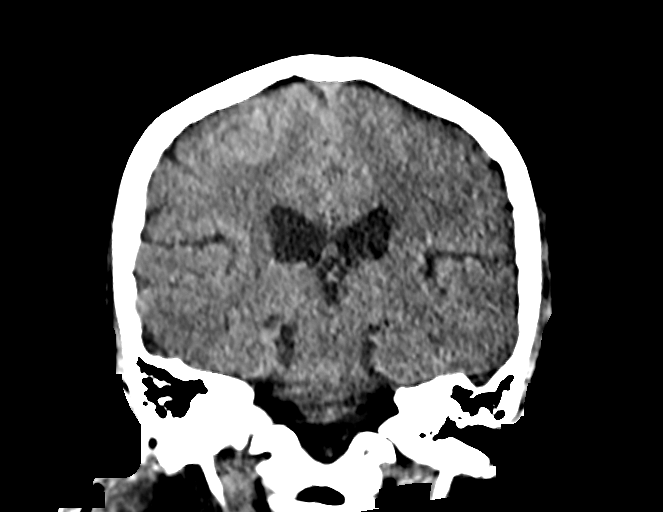

[16 of 47 positions shown; findings below may reference images not displayed]

FINDINGS: Brain: There is no mass, hemorrhage or extra-axial collection. The
size and configuration of the ventricles and extra-axial CSF spaces
are normal. There is hypoattenuation of the white matter, most
commonly indicating chronic small vessel disease. Punctate
calcification in the left temporal lobe is unchanged. Old right
cerebellar infarct. Old left basal ganglia infarcts.

Vascular: No abnormal hyperdensity of the major intracranial
arteries or dural venous sinuses. No intracranial atherosclerosis.

Skull: The visualized skull base, calvarium and extracranial soft
tissues are normal.

Sinuses/Orbits: No fluid levels or advanced mucosal thickening of
the visualized paranasal sinuses. No mastoid or middle ear effusion.
The orbits are normal.
IMPRESSION: Unchanged appearance of punctate calcification in the left temporal
lobe, likely a sequela of remote infection.

## 2021-01-24 NOTE — Addendum Note (Signed)
Addended by: [name], Presley Gora G on: 01/24/2021 02:37 PM   Modules accepted: Orders

## 2021-01-25 NOTE — Progress Notes (Unsigned)
Subjective:   SFKCLEXN Sheila Perez is a 75 y.o. female who presents for an Initial Medicare Annual Wellness Visit.  Review of Systems    N/A        Objective:    There were no vitals filed for this visit. There is no height or weight on file to calculate BMI.  Advanced Directives 03/29/2020 01/26/2020 03/31/2017  Does Patient Have a Medical Advance Directive? No No No  Would patient like information on creating a medical advance directive? - No - Patient declined -    Current Medications (verified) Outpatient Encounter Medications as of 01/26/2021  Medication Sig  . amLODipine (NORVASC) 10 MG tablet Take 1 tablet (10 mg total) by mouth daily.  . Ascorbic Acid (VITAMIN C) 1000 MG tablet Take 500 mg by mouth daily.  Marland Kitchen atorvastatin (LIPITOR) 20 MG tablet Take 1 tablet (20 mg total) by mouth daily.  . benazepril (LOTENSIN) 10 MG tablet Take 1 tablet (10 mg total) by mouth daily.  . cholecalciferol (VITAMIN D3) 25 MCG (1000 UNIT) tablet Take 1,000 Units by mouth daily.  Marland Kitchen co-enzyme Q-10 30 MG capsule Take 30 mg by mouth daily.   No facility-administered encounter medications on file as of 01/26/2021.    Allergies (verified) Patient has no known allergies.   History: Past Medical History:  Diagnosis Date  . Hypertension    Past Surgical History:  Procedure Laterality Date  . ABDOMINAL HYSTERECTOMY    . maxilo facial surgery     Family History  Problem Relation Age of Onset  . Kidney disease Mother   . Diabetes Mother   . Alcohol abuse Father   . Diabetes Sister   . Diabetes Daughter   . Diabetes Sister    Social History   Socioeconomic History  . Marital status: Widowed    Spouse name: Not on file  . Number of children: Not on file  . Years of education: Not on file  . Highest education level: Not on file  Occupational History  . Not on file  Tobacco Use  . Smoking status: Never Smoker  . Smokeless tobacco: Never Used  Vaping Use  . Vaping Use: Never  used  Substance and Sexual Activity  . Alcohol use: No  . Drug use: No  . Sexual activity: Not Currently  Other Topics Concern  . Not on file  Social History Narrative   Right handed   Lives daughter in a one story home   Social Determinants of Health   Financial Resource Strain: Not on file  Food Insecurity: Not on file  Transportation Needs: Not on file  Physical Activity: Not on file  Stress: Not on file  Social Connections: Not on file    Tobacco Counseling Counseling given: Not Answered   Clinical Intake:                 Diabetic?No          Activities of Daily Living No flowsheet data found.  Patient Care Team: [name], [name], MD as PCP - General (Family Medicine)  Indicate any recent Medical Services you may have received from other than [facility] providers in the past year (date may be approximate).     Assessment:   This is a routine wellness examination for TZGYFVCB.  Hearing/Vision screen No exam data present  Dietary issues and exercise activities discussed:    Goals   None    Depression Screen [facility] 2/9 Scores 09/09/2020 03/29/2020 12/06/2019  PHQ -  2 Score 0 0 0    Fall Risk Fall Risk  09/09/2020 03/29/2020 12/06/2019  Falls in the past year? 0 1 0  Number falls in past yr: 0 0 0  Injury with Fall? 0 0 0  Risk for fall due to : - - Impaired balance/gait  Follow up Education provided - Education provided    FALL RISK PREVENTION PERTAINING TO THE HOME:  Any stairs in or around the home? {YES/NO:21197} If so, are there any without handrails? No  Home free of loose throw rugs in walkways, pet beds, electrical cords, etc? Yes  Adequate lighting in your home to reduce risk of falls? Yes   ASSISTIVE DEVICES UTILIZED TO PREVENT FALLS:  Life alert? {YES/NO:21197} Use of a cane, walker or w/c? {YES/NO:21197} Grab bars in the bathroom? {YES/NO:21197} Shower chair or bench in shower? {YES/NO:21197} Elevated toilet seat or a  handicapped toilet? {YES/NO:21197}  TIMED UP AND GO:  Was the test performed? Yes .  Length of time to ambulate 10 feet: *** sec.   {Appearance of ENID:7824235}  Cognitive Function:        Immunizations  There is no immunization history on file for this patient.  TDAP status: Due, Education has been provided regarding the importance of this vaccine. Advised may receive this vaccine at local pharmacy or Health Dept. Aware to provide a copy of the vaccination record if obtained from local pharmacy or Health Dept. Verbalized acceptance and understanding.  Flu Vaccine status: Due, Education has been provided regarding the importance of this vaccine. Advised may receive this vaccine at local pharmacy or Health Dept. Aware to provide a copy of the vaccination record if obtained from local pharmacy or Health Dept. Verbalized acceptance and understanding.  {Pneumococcal vaccine status:2101807}  {Covid-19 vaccine status:2101808}  Qualifies for Shingles Vaccine? Yes   Zostavax completed No   Shingrix Completed?: No.    Education has been provided regarding the importance of this vaccine. Patient has been advised to call insurance company to determine out of pocket expense if they have not yet received this vaccine. Advised may also receive vaccine at local pharmacy or Health Dept. Verbalized acceptance and understanding.  Screening Tests Health Maintenance  Topic Date Due  . COVID-19 Vaccine (1) Never done  . COLONOSCOPY (Pts 45-40yrs Insurance coverage will need to be confirmed)  Never done  . MAMMOGRAM  Never done  . DEXA SCAN  Never done  . Hepatitis C Screening  Completed  . HPV VACCINES  Aged Out  . INFLUENZA VACCINE  Discontinued  . TETANUS/TDAP  Discontinued  . PNA vac Low Risk Adult  Discontinued    Health Maintenance  Health Maintenance Due  Topic Date Due  . COVID-19 Vaccine (1) Never done  . COLONOSCOPY (Pts 45-65yrs Insurance coverage will need to be confirmed)   Never done  . MAMMOGRAM  Never done  . DEXA SCAN  Never done    {Colorectal cancer screening:2101809}  {Mammogram status:21018020}  {Bone Density status:21018021}  Lung Cancer Screening: (Low Dose CT Chest recommended if Age 9-80 years, 30 pack-year currently smoking OR have quit w/in 15years.) does not qualify.   Lung Cancer Screening Referral: N/a   Additional Screening:  Hepatitis C Screening: does qualify; Completed 12/05/2019  Vision Screening: Recommended annual ophthalmology exams for early detection of glaucoma and other disorders of the eye. Is the patient up to date with their annual eye exam?  {YES/NO:21197} Who is the provider or what is the name of the office in  which the patient attends annual eye exams? *** If pt is not established with a provider, would they like to be referred to a provider to establish care? {YES/NO:21197}.   Dental Screening: Recommended annual dental exams for proper oral hygiene  Community Resource Referral / Chronic Care Management: CRR required this visit?  No   CCM required this visit?  No      Plan:     I have personally reviewed and noted the following in the patient's chart:   . Medical and social history . Use of alcohol, tobacco or illicit drugs  . Current medications and supplements . Functional ability and status . Nutritional status . Physical activity . Advanced directives . List of other physicians . Hospitalizations, surgeries, and ER visits in previous 12 months . Vitals . Screenings to include cognitive, depression, and falls . Referrals and appointments  In addition, I have reviewed and discussed with patient certain preventive protocols, quality metrics, and best practice recommendations. A written personalized care plan for preventive services as well as general preventive health recommendations were provided to patient.     Ofilia Neas, LPN   7/47/1855   Nurse Notes: None

## 2021-01-26 ENCOUNTER — Ambulatory Visit: Payer: Medicare HMO

## 2021-01-26 DIAGNOSIS — Z0000 Encounter for general adult medical examination without abnormal findings: Secondary | ICD-10-CM

## 2021-01-28 ENCOUNTER — Other Ambulatory Visit: Payer: Self-pay

## 2021-01-28 ENCOUNTER — Other Ambulatory Visit: Payer: Medicare HMO

## 2021-01-28 DIAGNOSIS — R930 Abnormal findings on diagnostic imaging of skull and head, not elsewhere classified: Secondary | ICD-10-CM

## 2021-01-28 NOTE — Addendum Note (Signed)
Addended by: Tessie Fass D on: 01/28/2021 03:24 PM   Modules accepted: Orders

## 2021-01-29 LAB — C-REACTIVE PROTEIN: CRP: 19.5 mg/L — ABNORMAL HIGH (ref <8.0)

## 2021-02-08 NOTE — Progress Notes (Signed)
NEUROLOGY FOLLOW UP OFFICE NOTE  Sheila Perez [id number]  Assessment/Plan:   1.  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  Involvement of deep gray matter, brainstem and cerebellum typically seen with hypertension.  There appears more abundant microhemorrhages in the cerebral white matter and cortex, which may be seen in cerebral amyloid angiopathy.  Also, blood pressure over the past year has been relatively controlled, so I cannot attribute this increase to blood pressure. 2.   Uncontrolled hypertension - blood pressure very elevated.  She reports that she has not been taking her medication.  Advised to patient and her daughter that she must continue to take her medication - if blood pressure remains elevated, must contact her PCP. 3.  Cerebrovascular disease 4.  Vascular-related mild neurocognitive disorder 5.  New onset left-sided headache over 39 years old - sed rate was normal but CRP was elevated - I would still like to evaluate for temporal arteritis  1.  I discussed the risks and benefits with patient and her daughter regarding ASA use.  She had an event last year - uncertain if it was TIA as symptoms were vague.  However, she does have significant cerebrovascular disease on imaging, making her high risk for ischemic stroke.  On the other hand, ASA may increase her risk of cerebral hemorrhage with cerebral amyloid angiopathy.  Given that she does not have a history of any definite clinical stroke, I agree with discontinuing ASA for now.  If she should have an ischemic stroke going forward, we may need to reconsider risks and benefits of restarting ASA 81mg  daily.  Patient and daughter appear to be agreeable to plan. 2.  Will send for left temporal artery biopsy.  She has seen ophthalmologist Dr. Delman Cheadle, at [facility]. 3.  Recommended  Mediterranean diet 3.  Management of other stroke risk factors:  Cholesterol, glucose 4.  Follow up 6 months.  Subjective:  Sheila Perez is a 75 year old right-handed black female with HTN and prediabetes who follows up for possible cerebral amyloid angiopathy  She is accompanied by her daughter who also supplements history.   Last seen in May 2021 for episode of difficulty forming words on 01/25/2020 for which she went to the ED.  CT and MRI of brain were negative for acute abnormality but did demonstrated significant chronic small vessel ischemic changes.  She was diagnosed with right-sided Bell's palsy and discharged on anti-viral and a prednisone taper.  Slurred speech has resolved gradually over the next week.  Advised to start ASA 81mg  daily.    Of note, she reports 5 year history of weakness in both legs.  No back pain or pain in legs.  No lower extremity numbness.  She has fallen once.  She reportedly is ordered for vascular studies.  She takes atorvastatin which has only recently been started.  She has never before been on a statin.  Again, so trouble swallowing.  Denies double vision.  No bowel or bladder retention.  Her daughter states that she has a sedentary lifestyle.  Patellar reflexes were brisk.  MRI cervical spine was ordered but never performed.  CK was 86.  She was seen by her PCP last month for new onset left-sided headache with vomiting.  No prior history of headache.  Sed rate on 01/07/2021 was 38 with elevated CRP of 16.1. Repeat CRP on 01/28/2021  was 19.5.  CT head on 01/07/2021 personally reviewed showed 4 mm hyperdensity within the left temporal lobe.  Follow up MRI of brain with and without contrast on 01/12/2021 personally reviewed was limited due to motion.  Moderate to advanced chronic small vessel ischemic changes in cerebral white matter and pons noted as well as innumerable chronic microhemorrhages involving the the subcortical white matter, deep gray nuclei, brainstem  and cerebellum suggestive of chronic hypertension, cerebral amyloid angiopathy or both.  She was advised to discontinue ASA, NSAIDs and fish oil.  Due to motion artifact, uncertain to identify finding on previous CT, so repeat CT head performed on 01/21/2021 personally reviewed showed unchanged punctate hyperdensity in left temporal lobe, thought to be calcification.  Headache resolved after 3 days .  She may have a brief headache once in a while since then.  Denies memory deficits.  Blood pressure elevated today.  She reports that she has not been taking her blood pressure medication.  01/26/2020 CT HEAD WO:  1. No acute intracranial findings. 2. Periventricular white matter and corona radiata hypodensities favor chronic ischemic microvascular white matter disease. 3. 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 4. Mildly irregular 1.7 by 1.9 cm lucent lesion along the hard palate, with possibilities including nasopalatine duct cyst, periapical granuloma, schwannoma, or malignancy such as adenoid cystic carcinoma. There is a note in the patient's surgical history of prior maxillofacial surgery, if this was in the vicinity of the maxilla/hard palate then correlation with that operation would be recommended. If the patient's prior surgery is not connected to this lesion, then maxillofacial MRI with and without contrast with attention to this hard palate mass would be recommended.  5. Chronic bilateral maxillary sinusitis.  01/26/2020 MRI BRAIN WO:  Brain: No acute infarct. Increased diffusion signal at the right basal ganglia is at the level of prior infarction and hemosiderin staining. Remote lacunar infarcts at the deep gray nuclei, brainstem, and right middle cerebellar peduncle. Confluent ischemic gliosis in the deep cerebral white matter. No acute hemorrhage.  Chronic blood products are seen along the bilateral posterior cerebral cortex, which may be posttraumatic. Remote micro hemorrhages at  the basal ganglia and left cerebellum, likely from small vessel disease. Cerebral volume is overall normal; there is some atrophy of the brainstem which is likely post ischemic. No masslike finding or extra-axial collection.  Vascular: Preserved flow voids.  Skull and upper cervical spine: No evidence of bone lesion.  Sinuses/Orbits: No acute finding  PAST MEDICAL HISTORY: Past Medical History:  Diagnosis Date  . Hypertension     MEDICATIONS: Current Outpatient Medications on File Prior to Visit  Medication Sig Dispense Refill  . amLODipine (NORVASC) 10 MG tablet Take 1 tablet (10 mg total) by mouth daily. 90 tablet 1  . Ascorbic Acid (VITAMIN C) 1000 MG tablet Take 500 mg by mouth daily.    Marland Kitchen atorvastatin (LIPITOR) 20 MG tablet Take 1 tablet (20 mg total) by mouth daily. 90 tablet 3  . benazepril (LOTENSIN) 10 MG tablet Take 1 tablet (10 mg total) by mouth daily. 30 tablet 1  . cholecalciferol (VITAMIN D3) 25 MCG (1000 UNIT) tablet Take 1,000 Units by mouth daily.    Marland Kitchen co-enzyme Q-10 30 MG capsule Take 30 mg by mouth daily.     No current facility-administered medications on file prior to visit.    ALLERGIES: No Known Allergies  FAMILY HISTORY: Family History  Problem Relation Age of Onset  . Kidney disease Mother   .  Diabetes Mother   . Alcohol abuse Father   . Diabetes Sister   . Diabetes Daughter   . Diabetes Sister       Objective:  Blood pressure (!) 196/113, pulse 98, height 5\' 3"  (1.6 m), weight 208 lb (94.3 kg), SpO2 99 %. General: No acute distress.  Patient appears well-groomed.   Head:  Normocephalic/atraumatic Eyes:  Fundi examined but not visualized Neck: supple, no paraspinal tenderness, full range of motion Heart:  Regular rate and rhythm Lungs:  Clear to auscultation bilaterally Back: No paraspinal tenderness Neurological Exam: alert and oriented.  Speech fluent and not dysarthric, language intact.   [employer] Mental Exam 02/09/2021  Weekday  Correct 0  Current year 0  What state are we in? 1  Amount spent 1  Amount left 0  # of Animals 1  5 objects recall 1  Number series 1  Hour markers 0  Time correct 0  Placed X in triangle correctly 1  Largest Figure 1  Name of female 2  Date back to work 0  Type of work 2  State she lived in 0  Total score 11   CN II-XII intact. Bulk and tone normal, muscle strength 5/5 throughout, slightly weak in hips.  Sensation to light touch, temperature and vibration intact.  Deep tendon reflexes 2+ throughout, toes downgoing.  Finger to nose and heel to shin testing intact.  Gait steady, Romberg negative.     Sheila Clines, DO  CC:  Betty [name], MD

## 2021-02-09 ENCOUNTER — Ambulatory Visit: Payer: Medicare HMO | Admitting: Neurology

## 2021-02-09 ENCOUNTER — Encounter: Payer: Self-pay | Admitting: Neurology

## 2021-02-09 ENCOUNTER — Other Ambulatory Visit: Payer: Self-pay

## 2021-02-09 VITALS — BP 196/113 | HR 98 | Ht 63.0 in | Wt 208.0 lb

## 2021-02-09 DIAGNOSIS — F0150 Vascular dementia without behavioral disturbance: Secondary | ICD-10-CM | POA: Diagnosis not present

## 2021-02-09 DIAGNOSIS — F01A0 Vascular dementia, mild, without behavioral disturbance, psychotic disturbance, mood disturbance, and anxiety: Secondary | ICD-10-CM

## 2021-02-09 DIAGNOSIS — F0670 Mild neurocognitive disorder due to known physiological condition without behavioral disturbance: Secondary | ICD-10-CM

## 2021-02-09 DIAGNOSIS — R519 Headache, unspecified: Secondary | ICD-10-CM

## 2021-02-09 DIAGNOSIS — I680 Cerebral amyloid angiopathy: Secondary | ICD-10-CM | POA: Diagnosis not present

## 2021-02-09 DIAGNOSIS — I679 Cerebrovascular disease, unspecified: Secondary | ICD-10-CM

## 2021-02-09 DIAGNOSIS — I10 Essential (primary) hypertension: Secondary | ICD-10-CM | POA: Diagnosis not present

## 2021-02-09 NOTE — Patient Instructions (Signed)
1.  Stop aspirin 2.  Start taking blood pressure medications again.  If still elevated, contact dr. [name] 3.  Mediterranean diet  Mediterranean Diet A Mediterranean diet refers to food and lifestyle choices that are based on the traditions of countries located on the [employer]. This way of eating has been shown to help prevent certain conditions and improve outcomes for people who have chronic diseases, like kidney disease and heart disease. What are tips for following this plan? Lifestyle  Cook and eat meals together with your family, when possible.  Drink enough fluid to keep your urine clear or pale yellow.  Be physically active every day. This includes: ? Aerobic exercise like running or swimming. ? Leisure activities like gardening, walking, or housework.  Get 7-8 hours of sleep each night.  If recommended by your health care provider, drink red wine in moderation. This means 1 glass a day for nonpregnant women and 2 glasses a day for men. A glass of wine equals 5 oz (150 mL). Reading food labels  Check the serving size of packaged foods. For foods such as rice and pasta, the serving size refers to the amount of cooked product, not dry.  Check the total fat in packaged foods. Avoid foods that have saturated fat or trans fats.  Check the ingredients list for added sugars, such as corn syrup.   Shopping  At the grocery store, buy most of your food from the areas near the walls of the store. This includes: ? Fresh fruits and vegetables (produce). ? Grains, beans, nuts, and seeds. Some of these may be available in unpackaged forms or large amounts (in bulk). ? Fresh seafood. ? Poultry and eggs. ? Low-fat dairy products.  Buy whole ingredients instead of prepackaged foods.  Buy fresh fruits and vegetables in-season from local farmers markets.  Buy frozen fruits and vegetables in resealable bags.  If you do not have access to quality fresh seafood, buy precooked  frozen shrimp or canned fish, such as tuna, salmon, or sardines.  Buy small amounts of raw or cooked vegetables, salads, or olives from the deli or salad bar at your store.  Stock your pantry so you always have certain foods on hand, such as olive oil, canned tuna, canned tomatoes, rice, pasta, and beans. Cooking  Cook foods with extra-virgin olive oil instead of using butter or other vegetable oils.  Have meat as a side dish, and have vegetables or grains as your main dish. This means having meat in small portions or adding small amounts of meat to foods like pasta or stew.  Use beans or vegetables instead of meat in common dishes like chili or lasagna.  Experiment with different cooking methods. Try roasting or broiling vegetables instead of steaming or sauteing them.  Add frozen vegetables to soups, stews, pasta, or rice.  Add nuts or seeds for added healthy fat at each meal. You can add these to yogurt, salads, or vegetable dishes.  Marinate fish or vegetables using olive oil, lemon juice, garlic, and fresh herbs. Meal planning  Plan to eat 1 vegetarian meal one day each week. Try to work up to 2 vegetarian meals, if possible.  Eat seafood 2 or more times a week.  Have healthy snacks readily available, such as: ? Vegetable sticks with hummus. ? [place] yogurt. ? Fruit and nut trail mix.  Eat balanced meals throughout the week. This includes: ? Fruit: 2-3 servings a day ? Vegetables: 4-5 servings a day ? Low-fat dairy:  2 servings a day ? Fish, poultry, or lean meat: 1 serving a day ? Beans and legumes: 2 or more servings a week ? Nuts and seeds: 1-2 servings a day ? Whole grains: 6-8 servings a day ? Extra-virgin olive oil: 3-4 servings a day  Limit red meat and sweets to only a few servings a month   What are my food choices?  Mediterranean diet ? Recommended  Grains: Whole-grain pasta. Brown rice. Bulgar wheat. Polenta. Couscous. Whole-wheat bread. [place].  Vegetables: Artichokes. Beets. Broccoli. Cabbage. Carrots. Eggplant. Green beans. Chard. Kale. Spinach. Onions. Leeks. Peas. Squash. Tomatoes. Peppers. Radishes.  Fruits: Apples. Apricots. Avocado. Berries. Bananas. Cherries. Dates. Figs. Grapes. Lemons. Melon. Oranges. Peaches. Plums. Pomegranate.  Meats and other protein foods: Beans. Almonds. Sunflower seeds. Pine nuts. Peanuts. [place]. Salmon. Scallops. Shrimp. [place]. Tilapia. Clams. Oysters. Eggs.  Dairy: Low-fat milk. Cheese. Greek yogurt.  Beverages: Water. Red wine. Herbal tea.  Fats and oils: Extra virgin olive oil. Avocado oil. Grape seed oil.  Sweets and desserts: [place] yogurt with honey. Baked apples. Poached pears. Trail mix.  Seasoning and other foods: Basil. Cilantro. Coriander. Cumin. Mint. Parsley. Sage. Rosemary. Tarragon. Garlic. Oregano. Thyme. Pepper. Balsalmic vinegar. Tahini. Hummus. Tomato sauce. Olives. Mushrooms. ? Limit these  Grains: Prepackaged pasta or rice dishes. Prepackaged cereal with added sugar.  Vegetables: Deep fried potatoes (french fries).  Fruits: Fruit canned in syrup.  Meats and other protein foods: Beef. Pork. Lamb. Poultry with skin. Hot dogs. Berniece Salines.  Dairy: Ice cream. Sour cream. Whole milk.  Beverages: Juice. Sugar-sweetened soft drinks. Beer. Liquor and spirits.  Fats and oils: Butter. Canola oil. Vegetable oil. Beef fat (tallow). Lard.  Sweets and desserts: Cookies. Cakes. Pies. Candy.  Seasoning and other foods: Mayonnaise. Premade sauces and marinades. The items listed may not be a complete list. Talk with your dietitian about what dietary choices are right for you. Summary  The Mediterranean diet includes both food and lifestyle choices.  Eat a variety of fresh fruits and vegetables, beans, nuts, seeds, and whole grains.  Limit the amount of red meat and sweets that you eat.  Talk with your health care provider about whether it is safe for you to drink red wine in  moderation. This means 1 glass a day for nonpregnant women and 2 glasses a day for men. A glass of wine equals 5 oz (150 mL). This information is not intended to replace advice given to you by your health care provider. Make sure you discuss any questions you have with your health care provider. Document Revised: 06/29/2016 Document Reviewed: 06/22/2016 Elsevier Patient Education  [place].

## 2021-02-10 NOTE — Progress Notes (Deleted)
Subjective:   Sheila Perez is a 75 y.o. female who presents for an Initial Medicare Annual Wellness Visit.  Review of Systems    N/A        Objective:    There were no vitals filed for this visit. There is no height or weight on file to calculate BMI.  Advanced Directives 02/09/2021 03/29/2020 01/26/2020 03/31/2017  Does Patient Have a Medical Advance Directive? No No No No  Would patient like information on creating a medical advance directive? - - No - Patient declined -    Current Medications (verified) Outpatient Encounter Medications as of 02/11/2021  Medication Sig  . amLODipine (NORVASC) 10 MG tablet Take 1 tablet (10 mg total) by mouth daily. (Patient not taking: Reported on 02/09/2021)  . Ascorbic Acid (VITAMIN C) 1000 MG tablet Take 500 mg by mouth daily. (Patient not taking: Reported on 02/09/2021)  . atorvastatin (LIPITOR) 20 MG tablet Take 1 tablet (20 mg total) by mouth daily. (Patient not taking: Reported on 02/09/2021)  . benazepril (LOTENSIN) 10 MG tablet Take 1 tablet (10 mg total) by mouth daily. (Patient not taking: Reported on 02/09/2021)  . cholecalciferol (VITAMIN D3) 25 MCG (1000 UNIT) tablet Take 1,000 Units by mouth daily. (Patient not taking: Reported on 02/09/2021)  . co-enzyme Q-10 30 MG capsule Take 30 mg by mouth daily. (Patient not taking: Reported on 02/09/2021)   No facility-administered encounter medications on file as of 02/11/2021.    Allergies (verified) Patient has no known allergies.   History: Past Medical History:  Diagnosis Date  . Hypertension    Past Surgical History:  Procedure Laterality Date  . ABDOMINAL HYSTERECTOMY    . maxilo facial surgery     Family History  Problem Relation Age of Onset  . Kidney disease Mother   . Diabetes Mother   . Alcohol abuse Father   . Diabetes Sister   . Diabetes Daughter   . Diabetes Sister    Social History   Socioeconomic History  . Marital status: Widowed    Spouse name: Not  on file  . Number of children: Not on file  . Years of education: Not on file  . Highest education level: Not on file  Occupational History  . Not on file  Tobacco Use  . Smoking status: Never Smoker  . Smokeless tobacco: Never Used  Vaping Use  . Vaping Use: Never used  Substance and Sexual Activity  . Alcohol use: No  . Drug use: No  . Sexual activity: Not Currently  Other Topics Concern  . Not on file  Social History Narrative   Right handed   Lives daughter in a two story home but lives on first floor   Drinks no caffeine    Social Determinants of Health   Financial Resource Strain: Not on file  Food Insecurity: Not on file  Transportation Needs: Not on file  Physical Activity: Not on file  Stress: Not on file  Social Connections: Not on file    Tobacco Counseling Counseling given: Not Answered   Clinical Intake:                 Diabetic?No          Activities of Daily Living No flowsheet data found.  Patient Care Team: [name], [name], MD as PCP - General (Family Medicine) Pieter Partridge, DO as Consulting Physician (Neurology)  Indicate any recent Medical Services you may have received from other than [facility] providers  in the past year (date may be approximate).     Assessment:   This is a routine wellness examination for Sheila Perez.  Hearing/Vision screen No exam data present  Dietary issues and exercise activities discussed:    Goals   None    Depression Screen PHQ 2/9 Scores 09/09/2020 03/29/2020 12/06/2019  PHQ - 2 Score 0 0 0    Fall Risk Fall Risk  02/09/2021 09/09/2020 03/29/2020 12/06/2019  Falls in the past year? 0 0 1 0  Number falls in past yr: 0 0 0 0  Injury with Fall? 0 0 0 0  Risk for fall due to : - - - Impaired balance/gait  Follow up - Education provided - Education provided    FALL RISK PREVENTION PERTAINING TO THE HOME:  Any stairs in or around the home? {YES/NO:21197} If so, are there any without handrails?  No  Home free of loose throw rugs in walkways, pet beds, electrical cords, etc? Yes  Adequate lighting in your home to reduce risk of falls? Yes   ASSISTIVE DEVICES UTILIZED TO PREVENT FALLS:  Life alert? {YES/NO:21197} Use of a cane, walker or w/c? {YES/NO:21197} Grab bars in the bathroom? {YES/NO:21197} Shower chair or bench in shower? {YES/NO:21197} Elevated toilet seat or a handicapped toilet? {YES/NO:21197}  TIMED UP AND GO:  Was the test performed? Yes .  Length of time to ambulate 10 feet: *** sec.   {Appearance of UMPN:3614431}  Cognitive Function:        Immunizations  There is no immunization history on file for this patient.  TDAP status: Due, Education has been provided regarding the importance of this vaccine. Advised may receive this vaccine at local pharmacy or Health Dept. Aware to provide a copy of the vaccination record if obtained from local pharmacy or Health Dept. Verbalized acceptance and understanding.  Flu Vaccine status: Declined, Education has been provided regarding the importance of this vaccine but patient still declined. Advised may receive this vaccine at local pharmacy or Health Dept. Aware to provide a copy of the vaccination record if obtained from local pharmacy or Health Dept. Verbalized acceptance and understanding.  Pneumococcal vaccine status: Declined,  Education has been provided regarding the importance of this vaccine but patient still declined. Advised may receive this vaccine at local pharmacy or Health Dept. Aware to provide a copy of the vaccination record if obtained from local pharmacy or Health Dept. Verbalized acceptance and understanding.   Covid-19 vaccine status: Declined, Education has been provided regarding the importance of this vaccine but patient still declined. Advised may receive this vaccine at local pharmacy or Health Dept.or vaccine clinic. Aware to provide a copy of the vaccination record if obtained from local  pharmacy or Health Dept. Verbalized acceptance and understanding.  Qualifies for Shingles Vaccine? Yes   Zostavax completed No   Shingrix Completed?: No.    Education has been provided regarding the importance of this vaccine. Patient has been advised to call insurance company to determine out of pocket expense if they have not yet received this vaccine. Advised may also receive vaccine at local pharmacy or Health Dept. Verbalized acceptance and understanding.  Screening Tests Health Maintenance  Topic Date Due  . COVID-19 Vaccine (1) Never done  . COLONOSCOPY (Pts 45-81yrs Insurance coverage will need to be confirmed)  Never done  . MAMMOGRAM  Never done  . DEXA SCAN  Never done  . Hepatitis C Screening  Completed  . HPV VACCINES  Aged Out  . INFLUENZA  VACCINE  Discontinued  . TETANUS/TDAP  Discontinued  . PNA vac Low Risk Adult  Discontinued    Health Maintenance  Health Maintenance Due  Topic Date Due  . COVID-19 Vaccine (1) Never done  . COLONOSCOPY (Pts 45-61yrs Insurance coverage will need to be confirmed)  Never done  . MAMMOGRAM  Never done  . DEXA SCAN  Never done     {Colorectal cancer screening:2101809}  {Mammogram status:21018020}  {Bone Density status:21018021}  Lung Cancer Screening: (Low Dose CT Chest recommended if Age 28-80 years, 30 pack-year currently smoking OR have quit w/in 15years.) does not qualify.   Lung Cancer Screening Referral: N/A   Additional Screening:  Hepatitis C Screening: does qualify; Completed 12/05/2019  Vision Screening: Recommended annual ophthalmology exams for early detection of glaucoma and other disorders of the eye. Is the patient up to date with their annual eye exam?  {YES/NO:21197} Who is the provider or what is the name of the office in which the patient attends annual eye exams? *** If pt is not established with a provider, would they like to be referred to a provider to establish care? {YES/NO:21197}.   Dental  Screening: Recommended annual dental exams for proper oral hygiene  Community Resource Referral / Chronic Care Management: CRR required this visit?  No   CCM required this visit?  No      Plan:     I have personally reviewed and noted the following in the patient's chart:   . Medical and social history . Use of alcohol, tobacco or illicit drugs  . Current medications and supplements . Functional ability and status . Nutritional status . Physical activity . Advanced directives . List of other physicians . Hospitalizations, surgeries, and ER visits in previous 12 months . Vitals . Screenings to include cognitive, depression, and falls . Referrals and appointments  In addition, I have reviewed and discussed with patient certain preventive protocols, quality metrics, and best practice recommendations. A written personalized care plan for preventive services as well as general preventive health recommendations were provided to patient.     Ofilia Neas, LPN   1/61/0960   Nurse Notes: None

## 2021-02-11 ENCOUNTER — Ambulatory Visit: Payer: Medicare HMO

## 2021-02-11 ENCOUNTER — Telehealth: Payer: Self-pay

## 2021-02-11 DIAGNOSIS — Z0000 Encounter for general adult medical examination without abnormal findings: Secondary | ICD-10-CM

## 2021-02-11 DIAGNOSIS — M316 Other giant cell arteritis: Secondary | ICD-10-CM

## 2021-02-11 NOTE — Telephone Encounter (Signed)
-----   Message from Pieter Partridge, DO sent at 02/09/2021  1:24 PM EDT ----- I want to send patient for left temporal artery biopsy to rule out temporal arteritis.

## 2021-02-11 NOTE — Telephone Encounter (Signed)
Referral ordered

## 2021-02-26 DIAGNOSIS — Z20822 Contact with and (suspected) exposure to covid-19: Secondary | ICD-10-CM | POA: Diagnosis not present

## 2021-03-10 DIAGNOSIS — R519 Headache, unspecified: Secondary | ICD-10-CM | POA: Diagnosis not present

## 2021-03-10 DIAGNOSIS — R7982 Elevated C-reactive protein (CRP): Secondary | ICD-10-CM | POA: Diagnosis not present

## 2021-03-28 NOTE — Progress Notes (Signed)
Subjective:   Sheila Perez is a 75 y.o. female who presents for Medicare Annual (Subsequent) preventive examination.  I connected with Sheila Perez today by telephone and verified that I am speaking with the correct person using two identifiers. Location patient: home Location provider: work Persons participating in the virtual visit: patient, provider.   I discussed the limitations, risks, security and privacy concerns of performing an evaluation and management service by telephone and the availability of in person appointments. I also discussed with the patient that there may be a patient responsible charge related to this service. The patient expressed understanding and verbally consented to this telephonic visit.    Interactive audio and video telecommunications were attempted between this provider and patient, however failed, due to patient having technical difficulties OR patient did not have access to video capability.  We continued and completed visit with audio only.     Review of Systems    n/a       Objective:    There were no vitals filed for this visit. There is no height or weight on file to calculate BMI.  Advanced Directives 02/09/2021 03/29/2020 01/26/2020 03/31/2017  Does Patient Have a Medical Advance Directive? No No No No  Would patient like information on creating a medical advance directive? - - No - Patient declined -    Current Medications (verified) Outpatient Encounter Medications as of 03/29/2021  Medication Sig  . amLODipine (NORVASC) 10 MG tablet Take 1 tablet (10 mg total) by mouth daily. (Patient not taking: Reported on 02/09/2021)  . Ascorbic Acid (VITAMIN C) 1000 MG tablet Take 500 mg by mouth daily. (Patient not taking: Reported on 02/09/2021)  . atorvastatin (LIPITOR) 20 MG tablet Take 1 tablet (20 mg total) by mouth daily. (Patient not taking: Reported on 02/09/2021)  . benazepril (LOTENSIN) 10 MG tablet Take 1 tablet (10 mg total)  by mouth daily. (Patient not taking: Reported on 02/09/2021)  . cholecalciferol (VITAMIN D3) 25 MCG (1000 UNIT) tablet Take 1,000 Units by mouth daily. (Patient not taking: Reported on 02/09/2021)  . co-enzyme Q-10 30 MG capsule Take 30 mg by mouth daily. (Patient not taking: Reported on 02/09/2021)   No facility-administered encounter medications on file as of 03/29/2021.    Allergies (verified) Patient has no known allergies.   History: Past Medical History:  Diagnosis Date  . Hypertension    Past Surgical History:  Procedure Laterality Date  . ABDOMINAL HYSTERECTOMY    . maxilo facial surgery     Family History  Problem Relation Age of Onset  . Kidney disease Mother   . Diabetes Mother   . Alcohol abuse Father   . Diabetes Sister   . Diabetes Daughter   . Diabetes Sister    Social History   Socioeconomic History  . Marital status: Widowed    Spouse name: Not on file  . Number of children: Not on file  . Years of education: Not on file  . Highest education level: Not on file  Occupational History  . Not on file  Tobacco Use  . Smoking status: Never Smoker  . Smokeless tobacco: Never Used  Vaping Use  . Vaping Use: Never used  Substance and Sexual Activity  . Alcohol use: No  . Drug use: No  . Sexual activity: Not Currently  Other Topics Concern  . Not on file  Social History Narrative   Right handed   Lives daughter in a two story home but lives on first  floor   Drinks no caffeine    Social Determinants of Health   Financial Resource Strain: Not on file  Food Insecurity: Not on file  Transportation Needs: Not on file  Physical Activity: Not on file  Stress: Not on file  Social Connections: Not on file    Tobacco Counseling Counseling given: Not Answered   Clinical Intake:                 Diabetic?no         Activities of Daily Living No flowsheet data found.  Patient Care Team: [name], [name], MD as PCP - General (Family  Medicine) Pieter Partridge, DO as Consulting Physician (Neurology)  Indicate any recent Medical Services you may have received from other than [facility] providers in the past year (date may be approximate).     Assessment:   This is a routine wellness examination for QVZDGLOV.  Hearing/Vision screen No exam data present  Dietary issues and exercise activities discussed:    Goals Addressed   None    Depression Screen PHQ 2/9 Scores 09/09/2020 03/29/2020 12/06/2019  PHQ - 2 Score 0 0 0    Fall Risk Fall Risk  02/09/2021 09/09/2020 03/29/2020 12/06/2019  Falls in the past year? 0 0 1 0  Number falls in past yr: 0 0 0 0  Injury with Fall? 0 0 0 0  Risk for fall due to : - - - Impaired balance/gait  Follow up - Education provided - Education provided    FALL RISK PREVENTION PERTAINING TO THE HOME:  Any stairs in or around the home? No  If so, are there any without handrails? No  Home free of loose throw rugs in walkways, pet beds, electrical cords, etc? Yes  Adequate lighting in your home to reduce risk of falls? Yes   ASSISTIVE DEVICES UTILIZED TO PREVENT FALLS:  Life alert? No  Use of a cane, walker or w/c? Yes  Grab bars in the bathroom? No  Shower chair or bench in shower? No  Elevated toilet seat or a handicapped toilet? No     Cognitive Function:     Normal cognitive status assessed by direct observation by this Nurse Health Advisor. No abnormalities found.      Immunizations  There is no immunization history on file for this patient.  TDAP status: Due, Education has been provided regarding the importance of this vaccine. Advised may receive this vaccine at local pharmacy or Health Dept. Aware to provide a copy of the vaccination record if obtained from local pharmacy or Health Dept. Verbalized acceptance and understanding.  Flu Vaccine status: Declined, Education has been provided regarding the importance of this vaccine but patient still declined. Advised may  receive this vaccine at local pharmacy or Health Dept. Aware to provide a copy of the vaccination record if obtained from local pharmacy or Health Dept. Verbalized acceptance and understanding.  Pneumococcal vaccine status: Declined,  Education has been provided regarding the importance of this vaccine but patient still declined. Advised may receive this vaccine at local pharmacy or Health Dept. Aware to provide a copy of the vaccination record if obtained from local pharmacy or Health Dept. Verbalized acceptance and understanding.   Covid-19 vaccine status: Declined, Education has been provided regarding the importance of this vaccine but patient still declined. Advised may receive this vaccine at local pharmacy or Health Dept.or vaccine clinic. Aware to provide a copy of the vaccination record if obtained from local pharmacy or Health  Dept. Verbalized acceptance and understanding.  Qualifies for Shingles Vaccine? Yes   Zostavax completed No   Shingrix Completed?: No.    Education has been provided regarding the importance of this vaccine. Patient has been advised to call insurance company to determine out of pocket expense if they have not yet received this vaccine. Advised may also receive vaccine at local pharmacy or Health Dept. Verbalized acceptance and understanding.  Screening Tests Health Maintenance  Topic Date Due  . COVID-19 Vaccine (1) Never done  . COLONOSCOPY (Pts 45-55yrs Insurance coverage will need to be confirmed)  Never done  . MAMMOGRAM  Never done  . DEXA SCAN  Never done  . Hepatitis C Screening  Completed  . HPV VACCINES  Aged Out  . INFLUENZA VACCINE  Discontinued  . TETANUS/TDAP  Discontinued  . PNA vac Low Risk Adult  Discontinued    Health Maintenance  Health Maintenance Due  Topic Date Due  . COVID-19 Vaccine (1) Never done  . COLONOSCOPY (Pts 45-29yrs Insurance coverage will need to be confirmed)  Never done  . MAMMOGRAM  Never done  . DEXA SCAN  Never  done    Colorectal cancer screening: Referral to GI placed 03/29/2021. Pt aware the office will call re: appt.  Mammogram status: Ordered 03/29/2021. Pt provided with contact info and advised to call to schedule appt.   Bone Density status: Ordered 03/29/2021. Pt provided with contact info and advised to call to schedule appt.  Lung Cancer Screening: (Low Dose CT Chest recommended if Age 53-80 years, 30 pack-year currently smoking OR have quit w/in 15years.) does not qualify.   Lung Cancer Screening Referral: n/a  Additional Screening:  Hepatitis C Screening: does qualify  Vision Screening: Recommended annual ophthalmology exams for early detection of glaucoma and other disorders of the eye. Is the patient up to date with their annual eye exam?  No  Who is the provider or what is the name of the office in which the patient attends annual eye exams? Referral completed 03/29/2021  If pt is not established with a provider, would they like to be referred to a provider to establish care? Yes .   Dental Screening: Recommended annual dental exams for proper oral hygiene  Community Resource Referral / Chronic Care Management: CRR required this visit?  No   CCM required this visit?  Yes      Plan:     I have personally reviewed and noted the following in the patient's chart:   . Medical and social history . Use of alcohol, tobacco or illicit drugs  . Current medications and supplements including opioid prescriptions.  . Functional ability and status . Nutritional status . Physical activity . Advanced directives . List of other physicians . Hospitalizations, surgeries, and ER visits in previous 12 months . Vitals . Screenings to include cognitive, depression, and falls . Referrals and appointments  In addition, I have reviewed and discussed with patient certain preventive protocols, quality metrics, and best practice recommendations. A written personalized care plan for  preventive services as well as general preventive health recommendations were provided to patient.     Randel Pigg, LPN   [id number]   Nurse Notes: Pt has stopped taking her medications, daughter verbalized the need for home health assistance, made appointment  with Dr. [name] on 04/18/2021 @300pm  for evaluation.  Submitted referral for home health assistance to embedded care.  Daughter is very concerned about her mother and is requesting Respite care.  Patient declines  all vaccinations.

## 2021-03-29 ENCOUNTER — Ambulatory Visit (INDEPENDENT_AMBULATORY_CARE_PROVIDER_SITE_OTHER): Payer: Medicare HMO

## 2021-03-29 DIAGNOSIS — Z1211 Encounter for screening for malignant neoplasm of colon: Secondary | ICD-10-CM

## 2021-03-29 DIAGNOSIS — Z1231 Encounter for screening mammogram for malignant neoplasm of breast: Secondary | ICD-10-CM | POA: Diagnosis not present

## 2021-03-29 DIAGNOSIS — Z0000 Encounter for general adult medical examination without abnormal findings: Secondary | ICD-10-CM | POA: Diagnosis not present

## 2021-03-29 DIAGNOSIS — Z742 Need for assistance at home and no other household member able to render care: Secondary | ICD-10-CM

## 2021-03-29 DIAGNOSIS — Z780 Asymptomatic menopausal state: Secondary | ICD-10-CM | POA: Diagnosis not present

## 2021-03-29 DIAGNOSIS — Z0100 Encounter for examination of eyes and vision without abnormal findings: Secondary | ICD-10-CM | POA: Diagnosis not present

## 2021-03-29 NOTE — Patient Instructions (Signed)
[name] , Thank you for taking time to come for your Medicare Wellness Visit. I appreciate your ongoing commitment to your health goals. Please review the following plan we discussed and let me know if I can assist you in the future.   Screening recommendations/referrals: Colonoscopy: referral 03/29/2021 Mammogram: referral 03/29/2021  Bone Density:referral 03/29/2021  Recommended yearly ophthalmology/optometry visit for glaucoma screening and checkup Recommended yearly dental visit for hygiene and checkup  Vaccinations: Influenza vaccine:declined  Pneumococcal vaccine: declined   Tdap vaccine: declined   Shingles vaccine:  declined    Advanced directives: will obtain   Conditions/risks identified: Pt has stopped taking her medications, daughter verbalized the need for home health assistance, made appointment  with Dr. [name] on 04/18/2021 @300pm  for evaluation.  Submitted referral for home health assistance to embedded care.  Daughter is very concerned about her mother and is requesting Respite care.  Patient declines all vaccinations.   Next appointment: 04/18/2021 @300pm  with Dr.Jordan    Preventive Care 15 Years and Older, Female Preventive care refers to lifestyle choices and visits with your health care provider that can promote health and wellness. What does preventive care include?  A yearly physical exam. This is also called an annual well check.  Dental exams once or twice a year.  Routine eye exams. Ask your health care provider how often you should have your eyes checked.  Personal lifestyle choices, including:  Daily care of your teeth and gums.  Regular physical activity.  Eating a healthy diet.  Avoiding tobacco and drug use.  Limiting alcohol use.  Practicing safe sex.  Taking low-dose aspirin every day.  Taking vitamin and mineral supplements as recommended by your health care provider. What happens during an annual well check? The  services and screenings done by your health care provider during your annual well check will depend on your age, overall health, lifestyle risk factors, and family history of disease. Counseling  Your health care provider may ask you questions about your:  Alcohol use.  Tobacco use.  Drug use.  Emotional well-being.  Home and relationship well-being.  Sexual activity.  Eating habits.  History of falls.  Memory and ability to understand (cognition).  Work and work Statistician.  Reproductive health. Screening  You may have the following tests or measurements:  Height, weight, and BMI.  Blood pressure.  Lipid and cholesterol levels. These may be checked every 5 years, or more frequently if you are over 57 years old.  Skin check.  Lung cancer screening. You may have this screening every year starting at age 65 if you have a 30-pack-year history of smoking and currently smoke or have quit within the past 15 years.  Fecal occult blood test (FOBT) of the stool. You may have this test every year starting at age 70.  Flexible sigmoidoscopy or colonoscopy. You may have a sigmoidoscopy every 5 years or a colonoscopy every 10 years starting at age 6.  Hepatitis C blood test.  Hepatitis B blood test.  Sexually transmitted disease (STD) testing.  Diabetes screening. This is done by checking your blood sugar (glucose) after you have not eaten for a while (fasting). You may have this done every 1-3 years.  Bone density scan. This is done to screen for osteoporosis. You may have this done starting at age 29.  Mammogram. This may be done every 1-2 years. Talk to your health care provider about how often you should have regular mammograms. Talk with your health care provider about  your test results, treatment options, and if necessary, the need for more tests. Vaccines  Your health care provider may recommend certain vaccines, such as:  Influenza vaccine. This is recommended  every year.  Tetanus, diphtheria, and acellular pertussis (Tdap, Td) vaccine. You may need a Td booster every 10 years.  Zoster vaccine. You may need this after age 27.  Pneumococcal 13-valent conjugate (PCV13) vaccine. One dose is recommended after age 61.  Pneumococcal polysaccharide (PPSV23) vaccine. One dose is recommended after age 69. Talk to your health care provider about which screenings and vaccines you need and how often you need them. This information is not intended to replace advice given to you by your health care provider. Make sure you discuss any questions you have with your health care provider. Document Released: 11/26/2015 Document Revised: 07/19/2016 Document Reviewed: 08/31/2015 Elsevier Interactive Patient Education  2017 [place] Prevention in the Home Falls can cause injuries. They can happen to people of all ages. There are many things you can do to make your home safe and to help prevent falls. What can I do on the outside of my home?  Regularly fix the edges of walkways and driveways and fix any cracks.  Remove anything that might make you trip as you walk through a door, such as a raised step or threshold.  Trim any bushes or trees on the path to your home.  Use bright outdoor lighting.  Clear any walking paths of anything that might make someone trip, such as rocks or tools.  Regularly check to see if handrails are loose or broken. Make sure that both sides of any steps have handrails.  Any raised decks and porches should have guardrails on the edges.  Have any leaves, snow, or ice cleared regularly.  Use sand or salt on walking paths during winter.  Clean up any spills in your garage right away. This includes oil or grease spills. What can I do in the bathroom?  Use night lights.  Install grab bars by the toilet and in the tub and shower. Do not use towel bars as grab bars.  Use non-skid mats or decals in the tub or shower.  If  you need to sit down in the shower, use a plastic, non-slip stool.  Keep the floor dry. Clean up any water that spills on the floor as soon as it happens.  Remove soap buildup in the tub or shower regularly.  Attach bath mats securely with double-sided non-slip rug tape.  Do not have throw rugs and other things on the floor that can make you trip. What can I do in the bedroom?  Use night lights.  Make sure that you have a light by your bed that is easy to reach.  Do not use any sheets or blankets that are too big for your bed. They should not hang down onto the floor.  Have a firm chair that has side arms. You can use this for support while you get dressed.  Do not have throw rugs and other things on the floor that can make you trip. What can I do in the kitchen?  Clean up any spills right away.  Avoid walking on wet floors.  Keep items that you use a lot in easy-to-reach places.  If you need to reach something above you, use a strong step stool that has a grab bar.  Keep electrical cords out of the way.  Do not use floor polish or wax  that makes floors slippery. If you must use wax, use non-skid floor wax.  Do not have throw rugs and other things on the floor that can make you trip. What can I do with my stairs?  Do not leave any items on the stairs.  Make sure that there are handrails on both sides of the stairs and use them. Fix handrails that are broken or loose. Make sure that handrails are as long as the stairways.  Check any carpeting to make sure that it is firmly attached to the stairs. Fix any carpet that is loose or worn.  Avoid having throw rugs at the top or bottom of the stairs. If you do have throw rugs, attach them to the floor with carpet tape.  Make sure that you have a light switch at the top of the stairs and the bottom of the stairs. If you do not have them, ask someone to add them for you. What else can I do to help prevent falls?  Wear shoes  that:  Do not have high heels.  Have rubber bottoms.  Are comfortable and fit you well.  Are closed at the toe. Do not wear sandals.  If you use a stepladder:  Make sure that it is fully opened. Do not climb a closed stepladder.  Make sure that both sides of the stepladder are locked into place.  Ask someone to hold it for you, if possible.  Clearly mark and make sure that you can see:  Any grab bars or handrails.  First and last steps.  Where the edge of each step is.  Use tools that help you move around (mobility aids) if they are needed. These include:  Canes.  Walkers.  Scooters.  Crutches.  Turn on the lights when you go into a dark area. Replace any light bulbs as soon as they burn out.  Set up your furniture so you have a clear path. Avoid moving your furniture around.  If any of your floors are uneven, fix them.  If there are any pets around you, be aware of where they are.  Review your medicines with your doctor. Some medicines can make you feel dizzy. This can increase your chance of falling. Ask your doctor what other things that you can do to help prevent falls. This information is not intended to replace advice given to you by your health care provider. Make sure you discuss any questions you have with your health care provider. Document Released: 08/26/2009 Document Revised: 04/06/2016 Document Reviewed: 12/04/2014 Elsevier Interactive Patient Education  2017 [employer].

## 2021-03-30 ENCOUNTER — Encounter: Payer: Self-pay | Admitting: Family Medicine

## 2021-03-30 ENCOUNTER — Telehealth: Payer: Self-pay | Admitting: *Deleted

## 2021-03-30 NOTE — Chronic Care Management (AMB) (Signed)
  Chronic Care Management   Note  03/30/2021 Name: Sheila Perez MRN: [id number] DOB: 1946-04-02  GYLUDAPT Sheila Perez is a 75 y.o. year old female who is a primary care patient of [name], [name], MD. I reached out to [place] by phone today in response to a referral sent by [name]'s PCP, [name], [name], MD.     [name] was given information about Chronic Care Management services today including:  1. CCM service includes personalized support from designated clinical staff supervised by her physician, including individualized plan of care and coordination with other care providers 2. 24/7 contact phone numbers for assistance for urgent and routine care needs. 3. Service will only be billed when office clinical staff spend 20 minutes or more in a month to coordinate care. 4. Only one practitioner may furnish and bill the service in a calendar month. 5. The patient may stop CCM services at any time (effective at the end of the month) by phone call to the office staff. 6. The patient will be responsible for cost sharing (co-pay) of up to 20% of the service fee (after annual deductible is met).  Daughter Sheila Perez DPR on file,  verbally agreed to assistance and services provided by embedded care coordination/care management team today.  Follow up plan: Telephone appointment with care management team member scheduled for:04/07/2021  [place], [place] [facility]  Direct Dial [phone]

## 2021-04-01 ENCOUNTER — Ambulatory Visit: Payer: Medicare HMO | Admitting: Neurology

## 2021-04-07 ENCOUNTER — Ambulatory Visit (INDEPENDENT_AMBULATORY_CARE_PROVIDER_SITE_OTHER): Payer: Medicare HMO

## 2021-04-07 ENCOUNTER — Telehealth: Payer: Medicare HMO

## 2021-04-07 DIAGNOSIS — E785 Hyperlipidemia, unspecified: Secondary | ICD-10-CM

## 2021-04-07 DIAGNOSIS — R7303 Prediabetes: Secondary | ICD-10-CM

## 2021-04-07 DIAGNOSIS — I639 Cerebral infarction, unspecified: Secondary | ICD-10-CM | POA: Diagnosis not present

## 2021-04-07 DIAGNOSIS — I10 Essential (primary) hypertension: Secondary | ICD-10-CM

## 2021-04-07 DIAGNOSIS — I680 Cerebral amyloid angiopathy: Secondary | ICD-10-CM

## 2021-04-07 NOTE — Chronic Care Management (AMB) (Signed)
Chronic Care Management   CCM RN Visit Note  04/07/2021 Name: Sheila Perez MRN: [id number] DOB: 16-Nov-1945  Subjective: SAYTKZSW Sheila Perez is a 75 y.o. year old female who is a primary care patient of [name], [name], MD. The care management team was consulted for assistance with disease management and care coordination needs.    Engaged with patient by telephone for initial visit in response to provider referral for case management and/or care coordination services.   Consent to Services:  The patient was given the following information about Chronic Care Management services today, agreed to services, and gave verbal consent: 1. CCM service includes personalized support from designated clinical staff supervised by the primary care provider, including individualized plan of care and coordination with other care providers 2. 24/7 contact phone numbers for assistance for urgent and routine care needs. 3. Service will only be billed when office clinical staff spend 20 minutes or more in a month to coordinate care. 4. Only one practitioner may furnish and bill the service in a calendar month. 5.The patient may stop CCM services at any time (effective at the end of the month) by phone call to the office staff. 6. The patient will be responsible for cost sharing (co-pay) of up to 20% of the service fee (after annual deductible is met). Patient agreed to services and consent obtained.  Patient agreed to services and verbal consent obtained.   Assessment: Review of patient past medical history, allergies, medications, health status, including review of consultants reports, laboratory and other test data, was performed as part of comprehensive evaluation and provision of chronic care management services.   SDOH (Social Determinants of Health) assessments and interventions performed:  SDOH Interventions   Flowsheet Row Most Recent Value  SDOH Interventions   Food Insecurity  Interventions Intervention Not Indicated  Housing Interventions Intervention Not Indicated  Transportation Interventions Intervention Not Indicated       CCM Care Plan  No Known Allergies  Outpatient Encounter Medications as of 04/07/2021  Medication Sig  . amLODipine (NORVASC) 10 MG tablet Take 1 tablet (10 mg total) by mouth daily.  . Ascorbic Acid (VITAMIN C) 1000 MG tablet Take 500 mg by mouth daily.  Marland Kitchen atorvastatin (LIPITOR) 20 MG tablet Take 1 tablet (20 mg total) by mouth daily.  . benazepril (LOTENSIN) 10 MG tablet Take 1 tablet (10 mg total) by mouth daily.  . cholecalciferol (VITAMIN D3) 25 MCG (1000 UNIT) tablet Take 1,000 Units by mouth daily.  Marland Kitchen co-enzyme Q-10 30 MG capsule Take 30 mg by mouth daily.   No facility-administered encounter medications on file as of 04/07/2021.    Patient Active Problem List   Diagnosis Date Noted  . Morbid obesity ([place]) 05/19/2020  . PAD (peripheral artery disease) ([place]) 05/07/2020  . Urinary incontinence in female 02/27/2020  . Hyperlipidemia 02/18/2020  . Prediabetes 02/18/2020  . CVA (cerebral vascular accident) ([place]) 02/18/2020  . Hypertension, essential, benign 01/30/2020    Conditions to be addressed/monitored:HTN, HLD and CVA  Care Plan : RNCM:Hypertension (Adult)  Updates made by [name], RN since 04/07/2021 12:00 AM    Problem: Lack of self management of Hypertension   Priority: High    Long-Range Goal: Effective self management of Hypertension   Start Date: 04/07/2021  Expected End Date: 09/12/2021  This Visit's Progress: On track  Priority: High  Note:   Objective:  . Last practice recorded BP readings:  BP Readings from Last 3 Encounters:  02/09/21 (!) 196/113  01/07/21 130/80  09/08/20 130/80 .   Marland Kitchen Most recent eGFR/CrCl: No results found for: EGFR  No components found for: CRCL Current Barriers:  Marland Kitchen Knowledge Deficits related to basic understanding of hypertension pathophysiology and self care  management  with hx of HLD, CVA, prediabetes . Knowledge Deficits related to understanding of medications prescribed for management of hypertension . Non-adherence to prescribed medication regimen . Cognitive Deficits . Does not adhere to provider recommendations re: activity, B/P monitoring . Unable to perform IADLs independently . Spoke with daughter [name] States that pt has a B/P monitor but has not been checking her B/P, states she sometimes does not take her medications, states she does not use a pill box, states she cook for the pt and she cooks healthly, low sodium diet.  States she also tries to have pt watch the sweets and CHO in her diet Case Manager Clinical Goal(s):  . patient will verbalize understanding of plan for hypertension management . patient will attend all scheduled medical appointments: Dr. [name] 04/18/21 . patient will demonstrate improved health management independence as evidenced by checking blood pressure as directed and notifying PCP if SBP>160 or DBP > 90, taking all medications as prescribe, and adhering to a low sodium diet as discussed. Interventions:  . Collaboration with [name], [name], MD regarding development and update of comprehensive plan of care as evidenced by provider attestation and co-signature . Inter-disciplinary care team collaboration (see longitudinal plan of care) . Evaluation of current treatment plan related to hypertension self management and patient's adherence to plan as established by provider. . Provided education to patient re: stroke prevention, s/s of heart attack and stroke, DASH diet, complications of uncontrolled blood pressure . Reviewed medications with patient and discussed importance of compliance . Referral to [facility] pharmacist for medication adherence issues . Referral to LCSW for caregiver stress and need for respite care . Discussed plans with patient for ongoing care management follow up and  provided patient with direct contact information for care management team . Advised patient, providing education and rationale, to monitor blood pressure daily and record, calling PCP for findings outside established parameters.  . Reviewed scheduled/upcoming provider appointments including: Dr. [name] 04/18/21 Self-Care Activities: - Self administers medications as prescribed Attends all scheduled provider appointments Calls provider office for new concerns, questions, or BP outside discussed parameters Checks BP and records as discussed Follows a low sodium diet/DASH diet Patient Goals: - check blood pressure 3 times per week - choose a place to take my blood pressure (home, clinic or office, retail store) - write blood pressure results in a log or diary Follow Up Plan: Telephone follow up appointment with care management team member scheduled for: 05/17/21 at 11:30 AM The patient has been provided with contact information for the care management team and has been advised to call with any health related questions or concerns.     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 with chronic conditions of HTN, HLD- pt lives with daughter who works full time, daughter reports that pt has become more inactive and  not wanting to move as much, states she needs help with bathing and sometimes changing her adult diaper, states she sometimes does not take her medications, daughter states she is feeling more stressed caring for pt and would like some help if possible  Unable to independently Self management of CVA,  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 Inter-disciplinary care team collaboration (see longitudinal plan of care)  patient will work with care management team to address care coordination and chronic disease management needs related to Disease Management  Educational Needs  Medication Management and Education  Psychosocial Support  Caregiver Stress support  Dementia and Caregiver Support   Interventions:   Evaluation of current treatment plan related to HTN, HLD, and CVA  , ADL IADL limitations, Social Isolation, Cognitive Deficits, and Inability to perform IADL's independently self-management and patient's adherence to plan as established by provider.  Collaboration with [name], [name], MD regarding development and update of comprehensive plan of care as evidenced by provider attestation       and co-signature  Inter-disciplinary care team collaboration (see longitudinal plan of care)  Discussed plans with patient for ongoing care management follow up and provided patient with direct contact information for care management team  Discussed need for Advanced Directives  and mailed Advanced Directives  packet  Referred to LCSW for caregiver stress and need for respite care Self Care Activities:  . Self administers medications as prescribed . Attends all scheduled provider appointments . Performs ADL's independently . Performs IADL's independently . Calls provider office for new concerns or questions Patient Goals: - complete a Power of Attorney; include caregiver - check out other  places when staying at home is no longer possible (assisted living center, nursing home) - check out services like in-home help or adult day care - make a list of people who can help and what they can do Follow Up Plan: Telephone follow up appointment with care management team member scheduled for: 05/17/21 at 11:30 AM The patient has been provided with contact information for the care management team and has been advised to call with any health related questions or concerns.       Plan:Telephone follow up appointment with care management team member scheduled for:  05/17/21 and The patient has been provided with contact information for the care management team and has been advised to call with any health related questions or concerns.  Peter Garter RN, Jackquline Denmark, CDE Care Management Coordinator [facility] [phone], Mobile [phone]

## 2021-04-07 NOTE — Patient Instructions (Signed)
Visit Information   PATIENT GOALS:  Goals Addressed            This Visit's Progress   . RNCM:Plan for Long Term Care-Stroke/mild cognitive impairment       Timeframe:  Long-Range Goal Priority:  Medium Start Date:  04/07/21                           Expected End Date:      09/12/21                 Follow Up Date 05/17/21    - complete a Power of Attorney; include caregiver - check out other places when staying at home is no longer possible (assisted living center, nursing home) - check out services like in-home help or adult day care - make a list of people who can help and what they can do    Why is this important?    Having and recovering from a stroke can be scary and stressful.   You can reduce stress by planning.   Preparing for the future is one of the most important things to do.   Thinking about how much care you/your loved one will need and how much it will cost is not easy.   You/your loved one can be part of making decisions for the future.   Making sure that your/your loved one's wishes for care are known is important.     Notes:     . RNCM:Track and Manage My Blood Pressure-Hypertension       Timeframe:  Long-Range Goal Priority:  High Start Date:     04/07/21                         Expected End Date:   09/12/21                    Follow Up Date 05/17/21    - check blood pressure 3 times per week - choose a place to take my blood pressure (home, clinic or office, retail store) - write blood pressure results in a log or diary    Why is this important?    You won't feel high blood pressure, but it can still hurt your blood vessels.   High blood pressure can cause heart or kidney problems. It can also cause a stroke.   Making lifestyle changes like losing a little weight or eating less salt will help.   Checking your blood pressure at home and at different times of the day can help to control blood pressure.   If the doctor prescribes medicine remember  to take it the way the doctor ordered.   Call the office if you cannot afford the medicine or if there are questions about it.     Notes:       How to Take Your Blood Pressure Blood pressure measures how strongly your blood is pressing against the walls of your arteries. Arteries are blood vessels that carry blood from your heart throughout your body. You can take your blood pressure at home with a machine. You may need to check your blood pressure at home:  To check if you have high blood pressure (hypertension).  To check your blood pressure over time.  To make sure your blood pressure medicine is working. Supplies needed:  Blood pressure machine, or monitor.  Dining room chair to sit in.  Table or desk.  Small notebook.  Pencil or pen. How to prepare Avoid these things for 30 minutes before checking your blood pressure:  Having drinks with caffeine in them, such as coffee or tea.  Drinking alcohol.  Eating.  Smoking.  Exercising. Do these things five minutes before checking your blood pressure:  Go to the bathroom and pee (urinate).  Sit in a dining chair. Do not sit in a soft couch or an armchair.  Be quiet. Do not talk. How to take your blood pressure Follow the instructions that came with your machine. If you have a digital blood pressure monitor, these may be the instructions: 1. Sit up straight. 2. Place your feet on the floor. Do not cross your ankles or legs. 3. Rest your left arm at the level of your heart. You may rest it on a table, desk, or chair. 4. Pull up your shirt sleeve. 5. Wrap the blood pressure cuff around the upper part of your left arm. The cuff should be 1 inch (2.5 cm) above your elbow. It is best to wrap the cuff around bare skin. 6. Fit the cuff snugly around your arm. You should be able to place only one finger between the cuff and your arm. 7. Place the cord so that it rests in the bend of your elbow. 8. Press the power  button. 9. Sit quietly while the cuff fills with air and loses air. 10. Write down the numbers on the screen. 11. Wait 2-3 minutes and then repeat steps 1-10.   What do the numbers mean? Two numbers make up your blood pressure. The first number is called systolic pressure. The second is called diastolic pressure. An example of a blood pressure reading is "120 over 80" (or 120/80). If you are an adult and do not have a medical condition, use this guide to find out if your blood pressure is normal: Normal  First number: below 120.  Second number: below 80. Elevated  First number: 120-129.  Second number: below 80. Hypertension stage 1  First number: 130-139.  Second number: 80-89. Hypertension stage 2  First number: 140 or above.  Second number: 87 or above. Your blood pressure is above normal even if only the top or bottom number is above normal. Follow these instructions at home:  Check your blood pressure as often as your doctor tells you to.  Check your blood pressure at the same time every day.  Take your monitor to your next doctor's appointment. Your doctor will: ? Make sure you are using it correctly. ? Make sure it is working right.  Make sure you understand what your blood pressure numbers should be.  Tell your doctor if your medicine is causing side effects.  Keep all follow-up visits as told by your doctor. This is important. General tips:  You will need a blood pressure machine, or monitor. Your doctor can suggest a monitor. You can buy one at a drugstore or online. When choosing one: ? Choose one with an arm cuff. ? Choose one that wraps around your upper arm. Only one finger should fit between your arm and the cuff. ? Do not choose one that measures your blood pressure from your wrist or finger. Where to find more information American Heart Association: www.heart.org Contact a doctor if:  Your blood pressure keeps being high. Get help right away  if:  Your first blood pressure number is higher than 180.  Your second blood pressure number is higher  than 120. Summary  Check your blood pressure at the same time every day.  Avoid caffeine, alcohol, smoking, and exercise for 30 minutes before checking your blood pressure.  Make sure you understand what your blood pressure numbers should be. This information is not intended to replace advice given to you by your health care provider. Make sure you discuss any questions you have with your health care provider. Document Revised: 10/24/2019 Document Reviewed: 10/24/2019 Elsevier Patient Education  2021 [place].   Consent to CCM Services: Ms. Selvage was given information about Chronic Care Management services today including:  1. CCM service includes personalized support from designated clinical staff supervised by her physician, including individualized plan of care and coordination with other care providers 2. 24/7 contact phone numbers for assistance for urgent and routine care needs. 3. Service will only be billed when office clinical staff spend 20 minutes or more in a month to coordinate care. 4. Only one practitioner may furnish and bill the service in a calendar month. 5. The patient may stop CCM services at any time (effective at the end of the month) by phone call to the office staff. 6. The patient will be responsible for cost sharing (co-pay) of up to 20% of the service fee (after annual deductible is met).  Patient agreed to services and verbal consent obtained.   The patient verbalized understanding of instructions, educational materials, and care plan provided today and agreed to receive a mailed copy of patient instructions, educational materials, and care plan.   Telephone follow up appointment with care management team member scheduled for:  Peter Garter RN, [facility], CDE Care Management Coordinator [facility] [phone], Mobile  [phone]  CLINICAL CARE PLAN: Patient Care Plan: RNCM:Hypertension (Adult)    Problem Identified: Lack of self management of Hypertension   Priority: High    Long-Range Goal: Effective self management of Hypertension   Start Date: 04/07/2021  Expected End Date: 09/12/2021  This Visit's Progress: On track  Priority: High  Note:   Objective:  . Last practice recorded BP readings:  BP Readings from Last 3 Encounters:  02/09/21 (!) 196/113  01/07/21 130/80  09/08/20 130/80 .   Marland Kitchen Most recent eGFR/CrCl: No results found for: EGFR  No components found for: CRCL Current Barriers:  Marland Kitchen Knowledge Deficits related to basic understanding of hypertension pathophysiology and self care management  with hx of HLD, CVA, prediabetes . Knowledge Deficits related to understanding of medications prescribed for management of hypertension . Non-adherence to prescribed medication regimen . Cognitive Deficits . Does not adhere to provider recommendations re: activity, B/P monitoring . Unable to perform IADLs independently . Spoke with daughter [name] States that pt has a B/P monitor but has not been checking her B/P, states she sometimes does not take her medications, states she does not use a pill box, states she cook for the pt and she cooks healthly, low sodium diet.  States she also tries to have pt watch the sweets and CHO in her diet Case Manager Clinical Goal(s):  . patient will verbalize understanding of plan for hypertension management . patient will attend all scheduled medical appointments: Dr. [name] 04/18/21 . patient will demonstrate improved health management independence as evidenced by checking blood pressure as directed and notifying PCP if SBP>160 or DBP > 90, taking all medications as prescribe, and adhering to a low sodium diet as discussed. Interventions:  . Collaboration with [name], [name], MD regarding development and update  of comprehensive  plan of care as evidenced by provider attestation and co-signature . Inter-disciplinary care team collaboration (see longitudinal plan of care) . Evaluation of current treatment plan related to hypertension self management and patient's adherence to plan as established by provider. . Provided education to patient re: stroke prevention, s/s of heart attack and stroke, DASH diet, complications of uncontrolled blood pressure . Reviewed medications with patient and discussed importance of compliance . Referral to [facility] pharmacist for medication adherence issues . Referral to LCSW for caregiver stress and need for respite care . Discussed plans with patient for ongoing care management follow up and provided patient with direct contact information for care management team . Advised patient, providing education and rationale, to monitor blood pressure daily and record, calling PCP for findings outside established parameters.  . Reviewed scheduled/upcoming provider appointments including: Dr. [name] 04/18/21 Self-Care Activities: - Self administers medications as prescribed Attends all scheduled provider appointments Calls provider office for new concerns, questions, or BP outside discussed parameters Checks BP and records as discussed Follows a low sodium diet/DASH diet Patient Goals: - check blood pressure 3 times per week - choose a place to take my blood pressure (home, clinic or office, retail store) - write blood pressure results in a log or diary Follow Up Plan: Telephone follow up appointment with care management team member scheduled for: 05/17/21 at 11:30 AM The patient has been provided with contact information for the care management team and has been advised to call with any health related questions or concerns.     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 with chronic conditions of HTN, HLD- pt lives with daughter who works full time, daughter reports that pt has become more inactive and not wanting to move as much, states she needs help with bathing and sometimes changing her adult diaper, states she sometimes does not take her medications, daughter states she is feeling more stressed caring for pt and would like some help if possible  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 Inter-disciplinary care team collaboration (see longitudinal plan of care)  patient will work with care management team to address care coordination and chronic disease management needs related to Disease Management  Educational Needs  Medication Management and Education  Psychosocial Support  Caregiver Stress support  Dementia and Caregiver Support   Interventions:   Evaluation of current treatment plan related to HTN, HLD, and CVA  , ADL IADL limitations, Social Isolation, Cognitive Deficits, and Inability to perform IADL's independently self-management and patient's adherence to plan as established by provider.  Collaboration with [name], [name], MD regarding development and update of comprehensive plan of care as evidenced by provider attestation       and co-signature  Inter-disciplinary care team  collaboration (  see longitudinal plan of care)  Discussed plans with patient for ongoing care management follow up and provided patient with direct contact information for care management team  Discussed need for Advanced Directives  and mailed Advanced Directives  packet  Referred to LCSW for caregiver stress and need for respite care Self Care Activities:  . Self administers medications as prescribed . Attends all scheduled provider appointments . Performs ADL's independently . Performs IADL's independently . Calls provider office for new concerns or questions Patient Goals: - complete a Power of Attorney; include caregiver - check out other places when staying at home is no longer possible (assisted living center, nursing home) - check out services like in-home help or adult day care - make a list of people who can help and what they can do Follow Up Plan: Telephone follow up appointment with care management team member scheduled for: 05/17/21 at 11:30 AM The patient has been provided with contact information for the care management team and has been advised to call with any health related questions or concerns.

## 2021-04-08 NOTE — Progress Notes (Signed)
Scheduled for 6/7 at 900 daughter said if you can call her earlier that morning feel free to call she likes early calls. I told her your first call was at [phone].  Thanks [employer]

## 2021-04-18 ENCOUNTER — Other Ambulatory Visit: Payer: Self-pay

## 2021-04-18 ENCOUNTER — Encounter: Payer: Self-pay | Admitting: Family Medicine

## 2021-04-18 ENCOUNTER — Ambulatory Visit (INDEPENDENT_AMBULATORY_CARE_PROVIDER_SITE_OTHER): Payer: Medicare HMO | Admitting: Family Medicine

## 2021-04-18 VITALS — BP 130/80 | HR 92 | Resp 16 | Ht 63.0 in

## 2021-04-18 DIAGNOSIS — E559 Vitamin D deficiency, unspecified: Secondary | ICD-10-CM

## 2021-04-18 DIAGNOSIS — K5900 Constipation, unspecified: Secondary | ICD-10-CM

## 2021-04-18 DIAGNOSIS — R7303 Prediabetes: Secondary | ICD-10-CM | POA: Diagnosis not present

## 2021-04-18 DIAGNOSIS — E785 Hyperlipidemia, unspecified: Secondary | ICD-10-CM

## 2021-04-18 DIAGNOSIS — Z0000 Encounter for general adult medical examination without abnormal findings: Secondary | ICD-10-CM | POA: Diagnosis not present

## 2021-04-18 DIAGNOSIS — Y92009 Unspecified place in unspecified non-institutional (private) residence as the place of occurrence of the external cause: Secondary | ICD-10-CM | POA: Diagnosis not present

## 2021-04-18 DIAGNOSIS — R7982 Elevated C-reactive protein (CRP): Secondary | ICD-10-CM | POA: Diagnosis not present

## 2021-04-18 DIAGNOSIS — I10 Essential (primary) hypertension: Secondary | ICD-10-CM | POA: Diagnosis not present

## 2021-04-18 DIAGNOSIS — W19XXXA Unspecified fall, initial encounter: Secondary | ICD-10-CM

## 2021-04-18 DIAGNOSIS — I680 Cerebral amyloid angiopathy: Secondary | ICD-10-CM | POA: Insufficient documentation

## 2021-04-18 DIAGNOSIS — Z20822 Contact with and (suspected) exposure to covid-19: Secondary | ICD-10-CM | POA: Diagnosis not present

## 2021-04-18 NOTE — Assessment & Plan Note (Signed)
BP adequately controlled. Continue Amlodipine 10 mg daily and Benazepril 10 mg daily. Low salt diet. Monitor BP regularly.

## 2021-04-18 NOTE — Assessment & Plan Note (Signed)
A healthy life style recommended for diabetes prevention. Further recommendations according to HgA1C result.

## 2021-04-18 NOTE — Assessment & Plan Note (Signed)
Continue Atorvastatin 20 mg daily and low fat diet. Further recommendations according to lipid panel result.

## 2021-04-18 NOTE — Assessment & Plan Note (Signed)
Continue vit D 10,000 U daily, dose will be adjust according to 25 [place] vit D result.

## 2021-04-18 NOTE — Patient Instructions (Signed)
A few things to remember from today's visit:   Routine general medical examination at a health care facility  Vitamin D deficiency, unspecified - Plan: VITAMIN D 25 Hydroxy (Vit-D Deficiency, Fractures)  Prediabetes - Plan: Hemoglobin A1c  Hyperlipidemia, unspecified hyperlipidemia type - Plan: Lipid panel  Hypertension, essential, benign - Plan: Basic metabolic panel  Elevated C-reactive protein (CRP) - Plan: C-reactive protein  If you need refills please call your pharmacy. Do not use My Chart to request refills or for acute issues that need immediate attention.   Please be sure medication list is accurate. If a new problem present, please set up appointment sooner than planned today.  A few tips:  -As we age balance is not as good as it was, so there is a higher risks for falls. Please remove small rugs and furniture that is "in your way" and could increase the risk of falls. Stretching exercises may help with fall prevention: Yoga and Tai Chi are some examples. Low impact exercise is better, so you are not very achy the next day.  -Sun screen and avoidance of direct sun light recommended. Caution with dehydration, if working outdoors be sure to drink enough fluids.  - Some medications are not safe as we age, increases the risk of side effects and can potentially interact with other medication you are also taken;  including some of over the counter medications. Be sure to let me know when you start a new medication even if it is a dietary/vitamin supplement.   -Healthy diet low in red meet/animal fat and sugar + regular physical activity is recommended.    No changes today.

## 2021-04-18 NOTE — Progress Notes (Signed)
HPI: Sheila Perez is a 75 y.o. female, who is here today with her daughter for her routine physical.  Last CPE: > a year ago. AWV on 03/29/21.  Regular exercise 3 or more time per week: She does not. Following a healthy diet: Her daughter cooks for , she does keep snacks in the house but sometimes one of her daughters sends her tree nuts,popcorn,and pistachios to snack on. She lives with her daughter. She is planning on traveling to [place] in 2 days to reunite with her husband.She is not sure when she is coming back to [place].  Chronic medical problems: PAD,HTN,HLD,vit D deficiency,prediabetes,and cerebral amyloid angiopathy among some.  There is no immunization history on file for this patient. Mammogram: Not in a few years. Colonoscopy: Overdue.She is not interested in further screening. DEXA: Never. Hep C screening: 12/05/19 NR  For the past 3 days she has had some constipation. Mildly hard stools and straining. Daily bowel movements, no tenesmus. Negative for abdominal pain,melena,or blood in stool. Her daughter just got benefiber and Miralax.  She was seen on 01/07/21, when she was c/o bitemporal headache.  Brain MRI on 01/12/21:  Innumerable foci of susceptibility throughout likely reflecting chronic microhemorrhages. May reflect sequelae of chronic hypertension and/or amyloid angiopathy.  The questioned abnormality on CT is difficult to evaluate given above and superimposed motion artifact. Could reflect an area of punctate acute hemorrhage, mineralization, or artifact. Follow-up head CT may be more helpful. Moderate to advanced chronic microvascular ischemic changes.  Head CT on 01/24/21: Unchanged appearance of punctate calcification in the left temporal lobe, likely a sequela of remote infection.  CRP elevated.  Lab Results  Component Value Date   CRP 19.5 (H) 01/28/2021   Neuro evaluation on 02/09/21. Temporal bx was recommended but because she  is not longer having headache,she decided to hold on procedure.  HLD: She is on Atorvastatin 20 mg daily. Tolerating medication well.  Lab Results  Component Value Date   CHOL 180 05/28/2020   HDL 45 (L) 05/28/2020   LDLCALC 118 (H) 05/28/2020   TRIG 71 05/28/2020   CHOLHDL 4.0 05/28/2020   HTN: She is on Benazepril 10 mg daily and Amlodipine 10 mg daily.  Lab Results  Component Value Date   CREATININE 1.10 (H) 01/07/2021   BUN 19 01/07/2021   NA 144 01/07/2021   K 4.2 01/07/2021   CL 107 01/07/2021   CO2 23 01/07/2021   Prediabetes: Negative for polydipsia,polyuria, or polyphagia.  Lab Results  Component Value Date   HGBA1C 6.1 (H) 01/07/2021   Vit D deficiency: She is on Vit D 10,000 U daily.  Unstable gait, she had 2 falls in the past 2 weeks, last one a week ago. Tripped with child last week and missed a stair 2 weeks ago. No serious injury, she needed help to get up.  Her daughter mentions that sometimes she "chokes" with saliva. She denies sore throat,dysphagia,or cough.  Review of Systems  Constitutional:  Positive for fatigue. Negative for appetite change and fever.  HENT:  Negative for hearing loss and mouth sores.   Eyes:  Negative for redness and visual disturbance.  Respiratory:  Negative for shortness of breath and wheezing.   Cardiovascular:  Negative for chest pain and leg swelling.  Gastrointestinal:  Positive for constipation. Negative for nausea and vomiting.  Endocrine: Negative for cold intolerance and heat intolerance.  Genitourinary:  Negative for decreased urine volume, dysuria and hematuria.  Musculoskeletal:  Positive for arthralgias and gait problem.  Skin:  Negative for color change and rash.  Allergic/Immunologic: Negative for environmental allergies.  Neurological:  Negative for syncope, facial asymmetry and weakness.  Hematological:  Negative for adenopathy. Does not bruise/bleed easily.  Psychiatric/Behavioral:  Negative for confusion  and sleep disturbance. The patient is not nervous/anxious.   All other systems reviewed and are negative. Current Outpatient Medications on File Prior to Visit  Medication Sig Dispense Refill   amLODipine (NORVASC) 10 MG tablet Take 1 tablet (10 mg total) by mouth daily. 90 tablet 1   Ascorbic Acid (VITAMIN C) 1000 MG tablet Take 500 mg by mouth daily.     atorvastatin (LIPITOR) 20 MG tablet Take 1 tablet (20 mg total) by mouth daily. 90 tablet 3   benazepril (LOTENSIN) 10 MG tablet Take 1 tablet (10 mg total) by mouth daily. 30 tablet 1   cholecalciferol (VITAMIN D3) 25 MCG (1000 UNIT) tablet Take 1,000 Units by mouth daily.     co-enzyme Q-10 30 MG capsule Take 30 mg by mouth daily.     No current facility-administered medications on file prior to visit.   Past Medical History:  Diagnosis Date   Hyperlipidemia    Hypertension    Past Surgical History:  Procedure Laterality Date   ABDOMINAL HYSTERECTOMY     maxilo facial surgery     No Known Allergies  Family History  Problem Relation Age of Onset   Kidney disease Mother    Diabetes Mother    Alcohol abuse Father    Diabetes Sister    Diabetes Daughter    Diabetes Sister     Social History   Socioeconomic History   Marital status: Widowed    Spouse name: Not on file   Number of children: Not on file   Years of education: Not on file   Highest education level: Not on file  Occupational History   Not on file  Tobacco Use   Smoking status: Never Smoker   Smokeless tobacco: Never Used  Vaping Use   Vaping Use: Never used  Substance and Sexual Activity   Alcohol use: No   Drug use: No   Sexual activity: Not Currently  Other Topics Concern   Not on file  Social History Narrative   Right handed   Lives daughter in a two story home but lives on first floor   Drinks no caffeine    Social Determinants of Health   Financial Resource Strain: Low Risk    Difficulty of Paying Living Expenses: Not hard at all  Food  Insecurity: No Food Insecurity   Worried About Programme researcher, broadcasting/film/video in the Last Year: Never true   Barista in the Last Year: Never true  Transportation Needs: No Transportation Needs   Lack of Transportation (Medical): No   Lack of Transportation (Non-Medical): No  Physical Activity: Inactive   Days of Exercise per Week: 0 days   Minutes of Exercise per Session: 0 min  Stress: No Stress Concern Present   Feeling of Stress : Not at all  Social Connections: Socially Isolated   Frequency of Communication with Friends and Family: More than three times a week   Frequency of Social Gatherings with Friends and Family: More than three times a week   Attends Religious Services: Never   Database administrator or Organizations: No   Attends Banker Meetings: Never   Marital Status: Never married   Vitals:  04/18/21 1509  BP: 130/80  Pulse: 92  Resp: 16  SpO2: 98%   Body mass index is 36.85 kg/m.  Wt Readings from Last 3 Encounters:  02/09/21 208 lb (94.3 kg)  01/07/21 209 lb 4 oz (94.9 kg)  09/08/20 211 lb (95.7 kg)   Physical Exam Vitals and nursing note reviewed.  Constitutional:      General: She is not in acute distress.    Appearance: She is well-developed.  HENT:     Head: Normocephalic and atraumatic.     Right Ear: Tympanic membrane, ear canal and external ear normal.     Left Ear: Tympanic membrane, ear canal and external ear normal.     Mouth/Throat:     Mouth: Mucous membranes are moist.     Pharynx: Oropharynx is clear.  Eyes:     Extraocular Movements: Extraocular movements intact.     Conjunctiva/sclera: Conjunctivae normal.     Pupils: Pupils are equal, round, and reactive to light.  Cardiovascular:     Rate and Rhythm: Normal rate and regular rhythm.     Heart sounds: No murmur heard.    Comments: DP pulses present. Pulmonary:     Effort: Pulmonary effort is normal. No respiratory distress.     Breath sounds: Normal breath sounds.   Abdominal:     Palpations: Abdomen is soft.     Tenderness: There is no abdominal tenderness.  Musculoskeletal:     Comments: No signs of synovitis appreciated.  Lymphadenopathy:     Cervical: No cervical adenopathy.  Skin:    General: Skin is warm.     Findings: No erythema or rash.     Comments: Hypopigmented macular lesions around neck.  Neurological:     Mental Status: She is alert and oriented to person, place, and time.     Cranial Nerves: No cranial nerve deficit.     Comments: Slow and unstable gait,assisted by daughter.  Psychiatric:        Speech: Speech normal.     Comments: Well groomed, good eye contact.   ASSESSMENT AND PLAN:  Sheila Perez was here today annual physical examination.  Orders Placed This Encounter  Procedures   VITAMIN D 25 Hydroxy (Vit-D Deficiency, Fractures)   C-reactive protein   Hemoglobin C3J   Basic metabolic panel   Lipid panel   Lab Results  Component Value Date   HGBA1C 6.3 04/18/2021   Lab Results  Component Value Date   CRP 1.5 04/18/2021   Lab Results  Component Value Date   CHOL 249 (H) 04/18/2021   HDL 45.10 04/18/2021   LDLCALC 188 (H) 04/18/2021   TRIG 78.0 04/18/2021   CHOLHDL 6 04/18/2021   Lab Results  Component Value Date   CREATININE 0.97 04/18/2021   BUN 22 04/18/2021   NA 141 04/18/2021   K 3.9 04/18/2021   CL 105 04/18/2021   CO2 22 04/18/2021   Routine general medical examination at a health care facility We discussed the importance of regular low impact physical activity and healthy diet for prevention of chronic illness and/or complications. Preventive guidelines reviewed. Vaccination: Declined vaccination. Mammogram and DEXA recently ordered, her daughter is trying to cancel them until she is back to [place]. Ca++ and vit D supplementation recommended. Next CPE in a year.  Elevated C-reactive protein (CRP) We discussed possible etiologies. She is not longer having  headache. Instructed about warning signs.  Fall in home, initial encounter Fall precautions discussed. She will benefit  for PT but leaving [place] in 2 days. Recommend walking a few times per week with assistance.  Prediabetes A healthy life style recommended for diabetes prevention. Further recommendations according to HgA1C result.  Vitamin D deficiency, unspecified Continue vit D 10,000 U daily, dose will be adjust according to 25 OH vit D result.  Hypertension, essential, benign BP adequately controlled. Continue Amlodipine 10 mg daily and Benazepril 10 mg daily. Low salt diet. Monitor BP regularly.  Hyperlipidemia Continue Atorvastatin 20 mg daily and low fat diet. Further recommendations according to lipid panel result.  Constipation, unspecified constipation type Adequate fiber and fluid intake. OTC Miralax daily as needed. She is not interested in colonoscopy. Instructed about warning signs.  Return in about 6 months (around 10/18/2021) for HTN,IFG.   Anais Koenen G. [name], MD  [facility]. [place] office.   A few things to remember from today's visit:   Routine general medical examination at a health care facility  Vitamin D deficiency, unspecified - Plan: VITAMIN D 25 Hydroxy (Vit-D Deficiency, Fractures)  Prediabetes - Plan: Hemoglobin A1c  Hyperlipidemia, unspecified hyperlipidemia type - Plan: Lipid panel  Hypertension, essential, benign - Plan: Basic metabolic panel  Elevated C-reactive protein (CRP) - Plan: C-reactive protein  If you need refills please call your pharmacy. Do not use My Chart to request refills or for acute issues that need immediate attention.   Please be sure medication list is accurate. If a new problem present, please set up appointment sooner than planned today.  A few tips:  -As we age balance is not as good as it was, so there is a higher risks for falls. Please remove small rugs and furniture that is "in your way"  and could increase the risk of falls. Stretching exercises may help with fall prevention: Yoga and Tai Chi are some examples. Low impact exercise is better, so you are not very achy the next day.  -Sun screen and avoidance of direct sun light recommended. Caution with dehydration, if working outdoors be sure to drink enough fluids.  - Some medications are not safe as we age, increases the risk of side effects and can potentially interact with other medication you are also taken;  including some of over the counter medications. Be sure to let me know when you start a new medication even if it is a dietary/vitamin supplement.   -Healthy diet low in red meet/animal fat and sugar + regular physical activity is recommended.    No changes today.

## 2021-04-19 ENCOUNTER — Ambulatory Visit (INDEPENDENT_AMBULATORY_CARE_PROVIDER_SITE_OTHER): Payer: Medicare HMO | Admitting: *Deleted

## 2021-04-19 ENCOUNTER — Telehealth: Payer: Medicare HMO

## 2021-04-19 DIAGNOSIS — W19XXXA Unspecified fall, initial encounter: Secondary | ICD-10-CM

## 2021-04-19 DIAGNOSIS — R7303 Prediabetes: Secondary | ICD-10-CM

## 2021-04-19 DIAGNOSIS — I639 Cerebral infarction, unspecified: Secondary | ICD-10-CM | POA: Diagnosis not present

## 2021-04-19 DIAGNOSIS — I680 Cerebral amyloid angiopathy: Secondary | ICD-10-CM

## 2021-04-19 DIAGNOSIS — E785 Hyperlipidemia, unspecified: Secondary | ICD-10-CM

## 2021-04-19 DIAGNOSIS — Z742 Need for assistance at home and no other household member able to render care: Secondary | ICD-10-CM

## 2021-04-19 DIAGNOSIS — I10 Essential (primary) hypertension: Secondary | ICD-10-CM

## 2021-04-19 LAB — BASIC METABOLIC PANEL
BUN: 22 mg/dL (ref 6–23)
CO2: 22 mEq/L (ref 19–32)
Calcium: 9.4 mg/dL (ref 8.4–10.5)
Chloride: 105 mEq/L (ref 96–112)
Creatinine, Ser: 0.97 mg/dL (ref 0.40–1.20)
GFR: 58.38 mL/min — ABNORMAL LOW (ref >60.00)
Glucose, Bld: 96 mg/dL (ref 70–99)
Potassium: 3.9 mEq/L (ref 3.5–5.1)
Sodium: 141 mEq/L (ref 135–145)

## 2021-04-19 LAB — C-REACTIVE PROTEIN: CRP: 1.5 mg/dL (ref 0.5–20.0)

## 2021-04-19 LAB — VITAMIN D 25 HYDROXY (VIT D DEFICIENCY, FRACTURES): VITD: 26.89 ng/mL — ABNORMAL LOW (ref 30.00–100.00)

## 2021-04-19 LAB — HEMOGLOBIN A1C: Hgb A1c MFr Bld: 6.3 % (ref 4.6–6.5)

## 2021-04-19 LAB — LIPID PANEL
Cholesterol: 249 mg/dL — ABNORMAL HIGH (ref 0–200)
HDL: 45.1 mg/dL (ref >39.00)
LDL Cholesterol: 188 mg/dL — ABNORMAL HIGH (ref 0–99)
NonHDL: 203.52
Total CHOL/HDL Ratio: 6
Triglycerides: 78 mg/dL (ref 0.0–149.0)
VLDL: 15.6 mg/dL (ref 0.0–40.0)

## 2021-04-19 NOTE — Patient Instructions (Signed)
Visit Information   PATIENT GOALS:  Goals Addressed            This Visit's Progress   . Maintain My Quality of Life by Receiving [place] in the Home.   On track    Timeframe:  Short-Range Goal Priority:  High Start Date:   04/19/2021                          Expected End Date:  06/09/2021                Follow-Up Date:  05/19/2021 at 10:30am.   Patient Goals/Self-Care Activities: . Review Personal Care Service information, application and agency provider list with daughter and obtain assistance with application completion and submission to [employer]. . Review Adult Medicaid tips and application with daughter and obtain assistance with application completion and submission to the [place]. . Select 2 to 3 agencies, from the [place] agency provider list, that you would like to use, and keep this list until your assessment is completed by [employer] and you have been approved for services. . Work with LCSW when you return from your trip to [place] in 6 months.   Remember: . Always use handrails on the stairs. . Always use your cane or walker when ambulating. . Always wear your glasses, especially at night or in areas not well lit.        Consent to CCM Services: Ms. Dorning was given information about Chronic Care Management services today including:  1. CCM service includes personalized support from designated clinical staff supervised by her physician, including individualized plan of care and coordination with other care providers 2. 24/7 contact phone numbers for assistance for urgent and routine care needs. 3. Service will only be billed when office clinical staff spend 20 minutes or more in a month to coordinate care. 4. Only one practitioner may furnish and bill the service in a calendar month. 5. The patient may stop CCM services at any time (effective at the end  of the month) by phone call to the office staff. 6. The patient will be responsible for cost sharing (co-pay) of up to 20% of the service fee (after annual deductible is met).  Patient agreed to services and verbal consent obtained.   Patient verbalizes understanding of instructions provided today and agrees to view in [place].   Telephone follow up appointment with care management team member scheduled for:  05/19/2021 at 10:30am.  Nat Christen LCSW Licensed Clinical Social Worker [facility] [phone]  CLINICAL CARE PLAN: Patient Care Plan: RNCM:Hypertension (Adult)    Problem Identified: Lack of self management of Hypertension   Priority: High    Long-Range Goal: Effective self management of Hypertension   Start Date: 04/07/2021  Expected End Date: 09/12/2021  This Visit's Progress: On track  Priority: High  Note:   Objective:  . Last practice recorded BP readings:  BP Readings from Last 3 Encounters:  02/09/21 (!) 196/113  01/07/21 130/80  09/08/20 130/80 .   Marland Kitchen Most recent eGFR/CrCl: No results found for: EGFR  No components found for: CRCL Current Barriers:  Marland Kitchen Knowledge Deficits related to basic understanding of hypertension pathophysiology and self care management  with hx of HLD, CVA, prediabetes . Knowledge Deficits related to understanding of medications prescribed for management of hypertension . Non-adherence to prescribed medication regimen . Cognitive Deficits . Does not  adhere to provider recommendations re: activity, B/P monitoring . Unable to perform IADLs independently . Spoke with daughter [name] States that pt has a B/P monitor but has not been checking her B/P, states she sometimes does not take her medications, states she does not use a pill box, states she cook for the pt and she cooks healthly, low sodium diet.  States she also tries to have pt watch the sweets and CHO in her diet Case Manager Clinical  Goal(s):  . patient will verbalize understanding of plan for hypertension management . patient will attend all scheduled medical appointments: Dr. [name] 04/18/21 . patient will demonstrate improved health management independence as evidenced by checking blood pressure as directed and notifying PCP if SBP>160 or DBP > 90, taking all medications as prescribe, and adhering to a low sodium diet as discussed. Interventions:  . Collaboration with [name], [name], MD regarding development and update of comprehensive plan of care as evidenced by provider attestation and co-signature . Inter-disciplinary care team collaboration (see longitudinal plan of care) . Evaluation of current treatment plan related to hypertension self management and patient's adherence to plan as established by provider. . Provided education to patient re: stroke prevention, s/s of heart attack and stroke, DASH diet, complications of uncontrolled blood pressure . Reviewed medications with patient and discussed importance of compliance . Referral to [facility] pharmacist for medication adherence issues . Referral to LCSW for caregiver stress and need for respite care . Discussed plans with patient for ongoing care management follow up and provided patient with direct contact information for care management team . Advised patient, providing education and rationale, to monitor blood pressure daily and record, calling PCP for findings outside established parameters.  . Reviewed scheduled/upcoming provider appointments including: Dr. [name] 04/18/21 Self-Care Activities: - Self administers medications as prescribed Attends all scheduled provider appointments Calls provider office for new concerns, questions, or BP outside discussed parameters Checks BP and records as discussed Follows a low sodium diet/DASH diet Patient Goals: - check blood pressure 3 times per week - choose a place to take my blood pressure (home, clinic or office, retail  store) - write blood pressure results in a log or diary Follow Up Plan: Telephone follow up appointment with care management team member scheduled for: 05/17/21 at 11:30 AM The patient has been provided with contact information for the care management team and has been advised to call with any health related questions or concerns.     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 with chronic conditions of HTN, HLD- pt lives with daughter who works full time, daughter reports that pt has become more inactive and not wanting to move as much, states she needs help with bathing and sometimes changing her adult diaper, states she sometimes does not take her medications, daughter states she is feeling more stressed caring for pt and would like some help if possible  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 Inter-disciplinary care team collaboration (see longitudinal plan of care)  patient will work with care management team to address care coordination and chronic disease management needs related to Disease Management  Educational Needs  Medication Management and Education  Psychosocial Support  Caregiver Stress  support  Dementia and Caregiver Support   Interventions:   Evaluation of current treatment plan related to HTN, HLD, and CVA  , ADL IADL limitations, Social Isolation, Cognitive Deficits, and Inability to perform IADL's independently self-management and patient's adherence to plan as established by provider.  Collaboration with [name], [name], MD regarding development and update of comprehensive plan of care as evidenced by provider attestation       and co-signature  Inter-disciplinary care team collaboration (see longitudinal plan of care)  Discussed plans with patient for ongoing care management follow up and provided patient with direct contact information for care management team  Discussed need for Advanced Directives  and mailed Advanced Directives  packet  Referred to LCSW for caregiver stress and need for respite care Self Care Activities:  . Self administers medications as prescribed . Attends all scheduled provider appointments . Performs ADL's independently . Performs IADL's independently . Calls provider office for new concerns or questions Patient Goals: - complete a Power of Attorney; include caregiver - check out other places when staying at home is no longer possible (assisted living center, nursing home) - check out services like in-home help or adult day care - make a list of people who can help and what they can do Follow Up Plan: Telephone follow up appointment with care management team member scheduled for: 05/17/21 at 11:30 AM The patient has been provided with contact information for the care management team and has been advised to call with any health related questions or concerns.     Patient Care Plan: LCSW Plan of Care    Problem Identified: Maintain My Quality of Life by Receiving [place] in the Home.   Priority: High    Goal: Maintain My Quality of Life by Receiving [place] in the Home.   Start Date: 04/19/2021  Expected  End Date: 06/09/2021  This Visit's Progress: On track  Priority: High  Note:   Current Barriers:   . Patient with Peripheral Artery Disease, Hypertension, Cerebral Vascular Accident, Cerebral Amyloid Angiopathy, Morbid Obesity, History of Falls and Fall Risk needs Support, Education, and Care Coordination to resolve unmet personal care needs in the home. . Patient is unable to self-administer medications as prescribed, without being prompted or reminded. . Patient is unable to consistently perform ADL's/IADL's independently. . Financial constraints related to inability to pay for in-home care services out-of-pocket. . Patient and daughter have been encouraged to apply for Adult Medicaid, through the [place]. . Patient and daughter have been encouraged to apply for [place], through [employer]. . Level of care concerns. Leodis [place] knowledge of available community agencies and resources. Clinical Goals:  . Over the next 45 to 60 days, patient will have in-home care services in place through [employer].    . Patient and daughter will work with [employer] and [employer], to coordinate care for [employer]. . Patient and daughter will select a [place]  Service Provider, from the list mailed to them by LCSW. . Patient and daughter will receive a Personal Care Service Application, mailed to them by LCSW, and notify LCSW if they need assistance with application completion and/or submission. . Patient and daughter will receive an Adult Medicaid Application, mailed to them by LCSW, and notify LCSW if they need assistance with application completion and/or submission. . Patient will attend all scheduled medical appointments as evidenced by patient report and care team review of appointment completion in electronic medical record. . Patient will demonstrate improved  health management independence as evidenced by having in-home care services in place. Clinical Interventions: . Patient and daughter interviewed and appropriate assessments performed. . Collaboration Dr. Betty [name] regarding development and update of comprehensive plan of care as evidenced by provider attestation and co-signature. Bertram Savin care team collaboration (see longitudinal plan of care). . Interventions performed:  Problem Solving/Task Centered, Psychoeducation/Health Education, Quality of Sleep Assessed and Sleep Hygiene Techniques Promoted, Caregiver Stress Acknowledged and Consideration of In-Home Care Services Encouraged. . Provided patient and daughter with the following list of resources and applications:  [id number] I REQUEST FOR INDEPENDENT ASSESSMENT FOR PERSONAL CARE SERVICES ATTESTATION OF MEDICAL NEED DMA-3051 REQUEST FOR INDEPENDENT ASSESSMENT FOR PERSONAL CARE SERVICES (PCS) ATTESTATION OF MEDICAL NEED PERSONAL CARE SERVICE PROVIDERS 2021 MEDICAID TIPS APPLICATION FOR [place]ID [place] . Discussed plans with patient and daughter for ongoing care management follow-up and provided direct contact information for care management team. . Collaborated with Dr. Betty [name] regarding need for review and signature of Personal Care Service application. . Assisted patient and daughter with obtaining information about health plan benefits through [facility]. . Provided education to patient and daughter regarding level of care options. . Assessed needs, level of care concerns, basic eligibility and provided education on Personal Care Service process and Adult Medicaid application process. . [place] application will be faxed to [employer], once completed and signed by Dr. Betty [name]. Marland Kitchen LCSW collaboration with [employer] to verify application is received and  processed. . Identified resources and durable medical equipment needed in the home to improve safety and promote independence. Patient Goals/Self-Care Activities:  . Review Personal Care Service information, application and agency provider list with daughter and obtain assistance with application completion and submission to [employer]. . Review Adult Medicaid tips and application with daughter and obtain assistance with application completion and submission to the [place]. . Select 2 to 3 agencies, from the [place] agency provider list, that you would like to use, and keep this list until your assessment is completed by [employer] and you have been approved for services. . Work with LCSW when you return from your trip to [place] in 6 months.   Remember: . Always use handrails on the stairs. . Always use your cane or walker when ambulating. . Always wear your glasses, especially at night or in areas not well lit.  Follow Up Plan: LCSW will follow-up with patient's daughter by telephone on 05/19/2021 at 10:30am.

## 2021-04-19 NOTE — Chronic Care Management (AMB) (Signed)
Chronic Care Management    Clinical Social Work Note  04/19/2021 Name: Sheila Perez MRN: [id number] DOB: 07/24/1946  [id number] [name] is a 75 y.o. year old female who is a primary care patient of [name], [name], MD. The CCM team was consulted to assist the patient with chronic disease management and/or care coordination needs related to: [employer], Level of Care Concerns, Advanced Directive Education and Caregiver Stress in Patient with Peripheral Artery Disease, Hypertension, Cerebral Vascular Accident, Cerebral Amyloid Angiopathy, Morbid Obesity, History of Falls and Fall Risk.   Engaged with patient and daughter by telephone for initial visit in response to provider referral for social work chronic care management and care coordination services.   Consent to Services:  The patient was given information about Chronic Care Management services, agreed to services, and gave verbal consent prior to initiation of services.  Please see initial visit note for detailed documentation.   Patient agreed to services and consent obtained.   Assessment: Review of patient past medical history, allergies, medications, and health status, including review of relevant consultants reports was performed today as part of a comprehensive evaluation and provision of chronic care management and care coordination services.     SDOH (Social Determinants of Health) assessments and interventions performed:  SDOH Interventions   Flowsheet Row Most Recent Value  SDOH Interventions   Food Insecurity Interventions Intervention Not Indicated  Financial Strain Interventions Intervention Not Indicated  Housing Interventions Intervention Not Indicated  Intimate Partner Violence Interventions Intervention Not Indicated  Physical Activity Interventions Patient Refused  Stress Interventions Intervention Not Indicated  Social Connections Interventions Intervention Not Indicated  Transportation  Interventions Intervention Not Indicated       Advanced Directives Status: See Care Plan for related entries.  Advanced Directives Packet mailed and emailed to patient's daughter.  CCM Care Plan  No Known Allergies  Outpatient Encounter Medications as of 04/19/2021  Medication Sig  . amLODipine (NORVASC) 10 MG tablet Take 1 tablet (10 mg total) by mouth daily.  . Ascorbic Acid (VITAMIN C) 1000 MG tablet Take 500 mg by mouth daily.  Marland Kitchen atorvastatin (LIPITOR) 20 MG tablet Take 1 tablet (20 mg total) by mouth daily.  . benazepril (LOTENSIN) 10 MG tablet Take 1 tablet (10 mg total) by mouth daily.  . cholecalciferol (VITAMIN D3) 25 MCG (1000 UNIT) tablet Take 1,000 Units by mouth daily.  Marland Kitchen co-enzyme Q-10 30 MG capsule Take 30 mg by mouth daily.   No facility-administered encounter medications on file as of 04/19/2021.    Patient Active Problem List   Diagnosis Date Noted  . Vitamin D deficiency, unspecified 04/18/2021  . 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CODE) 04/18/2021  . Morbid obesity ([place]) 05/19/2020  . PAD (peripheral artery disease) ([place]) 05/07/2020  . Urinary incontinence in female 02/27/2020  . Hyperlipidemia 02/18/2020  . Prediabetes 02/18/2020  . CVA (cerebral vascular accident) ([place]) 02/18/2020  . Hypertension, essential, benign 01/30/2020    Conditions to be addressed/monitored: [employer], Level of Care Concerns, Advanced Directive Education and Caregiver Stress in Patient with Peripheral Artery Disease, Hypertension, Cerebral Vascular Accident, Cerebral Amyloid Angiopathy, Morbid Obesity, History of Falls and Fall Risk.  Limited Social Support, Level of Care Concerns, ADL/IADL Limitations, Social Isolation and Limited Access to Caregiver.  Care Plan : LCSW Plan of Care  Updates made by Francis Gaines, LCSW since 04/19/2021 12:00 AM    Problem: Maintain My Quality of Life by Receiving [place] in the Home.   Priority: High  Goal: Maintain My  Quality of Life by Receiving [place] in the Home.   Start Date: 04/19/2021  Expected End Date: 06/09/2021  This Visit's Progress: On track  Priority: High  Note:   Current Barriers:   . Patient with Peripheral Artery Disease, Hypertension, Cerebral Vascular Accident, Cerebral Amyloid Angiopathy, Morbid Obesity, History of Falls and Fall Risk needs Support, Education, and Care Coordination to resolve unmet personal care needs in the home. . Patient is unable to self-administer medications as prescribed, without being prompted or reminded. . Patient is unable to consistently perform ADL's/IADL's independently. . Financial constraints related to inability to pay for in-home care services out-of-pocket. . Patient and daughter have been encouraged to apply for Adult Medicaid, through the [place]. . Patient and daughter have been encouraged to apply for [place], through [employer]. . Level of care concerns. Leodis [place] knowledge of available community agencies and resources. Clinical Goals:  . Over the next 45 to 60 days, patient will have in-home care services in place through [employer].    . Patient and daughter will work with [employer] and [employer], to coordinate care for [employer]. . Patient and daughter will select a Personal Care Service Provider, from the list mailed to them by LCSW. . Patient and daughter will receive a Personal Care Service Application, mailed to them by LCSW, and notify LCSW if they need assistance with application completion and/or submission. . Patient and daughter will receive an Adult Medicaid Application, mailed to them by LCSW, and notify LCSW if they need assistance with application completion and/or submission. . Patient will attend all scheduled medical appointments as evidenced by patient report and care team  review of appointment completion in electronic medical record. . Patient will demonstrate improved health management independence as evidenced by having in-home care services in place. Clinical Interventions: . Patient and daughter interviewed and appropriate assessments performed. . Collaboration Dr. Betty [name] regarding development and update of comprehensive plan of care as evidenced by provider attestation and co-signature. Bertram Savin care team collaboration (see longitudinal plan of care). . Interventions performed:  Problem Solving/Task Centered, Psychoeducation/Health Education, Quality of Sleep Assessed and Sleep Hygiene Techniques Promoted, Caregiver Stress Acknowledged and Consideration of In-Home Care Services Encouraged. . Provided patient and daughter with the following list of resources and applications: [place] [id number] I REQUEST FOR INDEPENDENT ASSESSMENT FOR PERSONAL CARE SERVICES ATTESTATION OF MEDICAL NEED [id number] REQUEST FOR INDEPENDENT ASSESSMENT FOR PERSONAL CARE SERVICES (PCS) ATTESTATION OF MEDICAL NEED PERSONAL CARE SERVICE PROVIDERS 2021 MEDICAID TIPS APPLICATION FOR [place]ID [place] AND [place] . Discussed plans with patient and daughter for ongoing care management follow-up and provided direct contact information for care management team. . Collaborated with Dr. Betty [name] regarding need for review and signature of Personal Care Service application. . Assisted patient and daughter with obtaining information about health plan benefits through [facility]. . Provided education to patient and daughter regarding level of care options. . Assessed needs, level of care concerns, basic eligibility and provided education on Personal Care Service process and Adult Medicaid application process. . [place] application will be faxed to [employer], once completed and signed by Dr. Betty [name]. Marland Kitchen LCSW  collaboration with [employer] to verify application is received and processed. . Identified resources and durable medical equipment needed in the home to improve safety and promote independence. Patient Goals/Self-Care Activities:  .  Review Personal Care Service information, application and agency provider list with daughter and obtain assistance with application completion and submission to [employer]. . Review Adult Medicaid tips and application with daughter and obtain assistance with application completion and submission to the [place]. . Select 2 to 3 agencies, from the [place] agency provider list, that you would like to use, and keep this list until your assessment is completed by [employer] and you have been approved for services. . Work with LCSW when you return from your trip to [place] in 6 months.   Remember: . Always use handrails on the stairs. . Always use your cane or walker when ambulating. . Always wear your glasses, especially at night or in areas not well lit.  Follow Up Plan: LCSW will follow-up with patient's daughter by telephone on 05/19/2021 at 10:30am.      Follow Up Plan: 05/19/2021 at 10:30am.      Nat Christen LCSW Licensed Clinical Social Worker [place] [phone]

## 2021-04-25 ENCOUNTER — Other Ambulatory Visit: Payer: Medicare HMO

## 2021-05-17 ENCOUNTER — Telehealth: Payer: Medicare HMO

## 2021-05-17 ENCOUNTER — Telehealth: Payer: Self-pay

## 2021-05-17 NOTE — Telephone Encounter (Signed)
  Care Management   Follow Up Note   05/17/2021 Name: Sheila Perez MRN: [id number] DOB: [birth year]   Referred by: [name], [name], MD Reason for referral : Chronic Care Management (RNCM: Follow up Outreach Chronic Care Management and coordination needs)   An unsuccessful telephone outreach was attempted today. The patient was referred to the case management team for assistance with care management and care coordination. Spoke with daughter [name] Designated Party Release HIPAA verified.  States that pt is currently out of the country in [place] she is expected to return 06/27/21  Follow Up Plan: Telephone follow up appointment with care management team member scheduled for: 07/04/21 after pt returns  [place], [facility], CDE Care Management Coordinator [facility] [phone], Mobile [phone]

## 2021-05-19 ENCOUNTER — Ambulatory Visit (INDEPENDENT_AMBULATORY_CARE_PROVIDER_SITE_OTHER): Payer: Medicare HMO | Admitting: *Deleted

## 2021-05-19 ENCOUNTER — Telehealth: Payer: Medicare HMO

## 2021-05-19 DIAGNOSIS — I10 Essential (primary) hypertension: Secondary | ICD-10-CM

## 2021-05-19 DIAGNOSIS — I639 Cerebral infarction, unspecified: Secondary | ICD-10-CM | POA: Diagnosis not present

## 2021-05-19 DIAGNOSIS — I739 Peripheral vascular disease, unspecified: Secondary | ICD-10-CM

## 2021-05-19 NOTE — Patient Instructions (Signed)
Visit Information  PATIENT GOALS:  Goals Addressed             This Visit's Progress    COMPLETED: Maintain My Quality of Life by Receiving [place] in the Home.   On track    Timeframe:  Short-Range Goal Priority:  High Start Date:   04/19/2021                          Expected End Date:  05/19/2021                Follow-Up Date:  No Follow-Up Required.   Patient Goals/Self-Care Activities: Conversed with daughter, [name] who reported that patient will remain in [place] until mid-to-late August of 2022.   Daughter, [name] has LCSW's contact information and agreed to contact LCSW directly if she decides to pursue applying for [employer] Youth worker) for you, through [employer]. Remember: Always use handrails on the stairs. Always use your cane or walker when ambulating. Always wear your glasses, especially at night or in areas not well lit.          Patient verbalizes understanding of instructions provided today and agrees to view in [place].   No Follow-Up Required.  Nat Christen LCSW Licensed Clinical Social Worker [place] [phone]

## 2021-05-19 NOTE — Chronic Care Management (AMB) (Addendum)
Chronic Care Management    Clinical Social Work Note  05/19/2021 Name: Sheila Perez MRN: [id number] DOB: [birth year]  CHENIDPO Sheila Perez is a 75 y.o. year old female who is a primary care patient of [name], [name], MD. The CCM team was consulted to assist the patient with chronic disease management and/or care coordination needs related to: Level of Care Concerns.   Engaged with patient by telephone for follow up visit in response to provider referral for social work chronic care management and care coordination services.   Consent to Services:  The patient was given information about Chronic Care Management services, agreed to services, and gave verbal consent prior to initiation of services.  Please see initial visit note for detailed documentation.   Patient agreed to services and consent obtained.   Assessment: Review of patient past medical history, allergies, medications, and health status, including review of relevant consultants reports was performed today as part of a comprehensive evaluation and provision of chronic care management and care coordination services.     SDOH (Social Determinants of Health) assessments and interventions performed:    Advanced Directives Status: Not addressed in this encounter.  CCM Care Plan  No Known Allergies  Outpatient Encounter Medications as of 05/19/2021  Medication Sig   amLODipine (NORVASC) 10 MG tablet Take 1 tablet (10 mg total) by mouth daily.   Ascorbic Acid (VITAMIN C) 1000 MG tablet Take 500 mg by mouth daily.   atorvastatin (LIPITOR) 20 MG tablet Take 1 tablet (20 mg total) by mouth daily.   benazepril (LOTENSIN) 10 MG tablet Take 1 tablet (10 mg total) by mouth daily.   cholecalciferol (VITAMIN D3) 25 MCG (1000 UNIT) tablet Take 1,000 Units by mouth daily.   co-enzyme Q-10 30 MG capsule Take 30 mg by mouth daily.   No facility-administered encounter medications on file as of 05/19/2021.    Patient Active  Problem List   Diagnosis Date Noted   Vitamin D deficiency, unspecified 04/18/2021   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CODE) 04/18/2021   Morbid obesity ([place]) 05/19/2020   PAD (peripheral artery disease) ([place]) 05/07/2020   Urinary incontinence in female 02/27/2020   Hyperlipidemia 02/18/2020   Prediabetes 02/18/2020   CVA (cerebral vascular accident) ([place]) 02/18/2020   Hypertension, essential, benign 01/30/2020    Conditions to be addressed/monitored: [place] with ADL's and IADL's. Financial Constraints Related to Surveyor, mining, Limited Social Support, Level of Care Concerns, ADL/IADL Limitations, and Limited Access to Caregiver.  Care Plan : LCSW Plan of Care  Updates made by Francis Gaines, LCSW since 05/19/2021 12:00 AM     Problem: Maintain My Quality of Life by Receiving [place] in the Home. Resolved 05/19/2021  Priority: High     Goal: Maintain My Quality of Life by Receiving [place] in the Home. Completed 05/19/2021  Start Date: 04/19/2021  Expected End Date: 05/19/2021  This Visit's Progress: On track  Recent Progress: On track  Priority: High  Note:   Current Barriers:   Patient with Peripheral Artery Disease, Hypertension, Cerebral Vascular Accident, Cerebral Amyloid Angiopathy, Morbid Obesity, History of Falls and Fall Risk needs Support, Education, and Care Coordination to resolve unmet personal care needs in the home. Patient is unable to self-administer medications as prescribed, without being prompted or reminded. Patient is unable to consistently perform ADL's/IADL's independently. Financial constraints related to inability to pay for in-home care services out-of-pocket. Patient and daughter have been encouraged to apply for Adult Medicaid, through the [facility]. Patient and daughter have been encouraged to apply for [place], through [employer]. Level of care  concerns. Lacks knowledge of available community agencies and resources. Clinical Goals:  Over the next 45 to 60 days, patient will have in-home care services in place through [employer].    Patient and daughter will work with [employer] and [employer], to coordinate care for [employer]. Patient and daughter will select a Personal Care Service Provider, from the list mailed to them by LCSW. Patient and daughter will receive a Personal Care Service Application, mailed to them by LCSW, and notify LCSW if they need assistance with application completion and/or submission. Patient and daughter will receive an Adult Medicaid Application, mailed to them by LCSW, and notify LCSW if they need assistance with application completion and/or submission. Patient will attend all scheduled medical appointments as evidenced by patient report and care team review of appointment completion in electronic medical record. Patient will demonstrate improved health management independence as evidenced by having in-home care services in place. Clinical Interventions: Patient and daughter interviewed and appropriate assessments performed. Collaboration Dr. Betty [name] regarding development and update of comprehensive plan of care as evidenced by provider attestation and co-signature. Inter-disciplinary care team collaboration (see longitudinal plan of care). Interventions performed:  Problem Solving/Task Centered, Psychoeducation/Health Education, Quality of Sleep Assessed and Sleep Hygiene Techniques Promoted, Caregiver Stress Acknowledged and Consideration of In-Home Care Services Encouraged. Provided patient and daughter with the following list of resources and applications: [place] [id number] I REQUEST FOR INDEPENDENT ASSESSMENT FOR PERSONAL CARE SERVICES ATTESTATION OF MEDICAL NEED [id number] REQUEST FOR INDEPENDENT ASSESSMENT FOR PERSONAL CARE SERVICES  (PCS) ATTESTATION OF MEDICAL NEED PERSONAL CARE SERVICE PROVIDERS 2021 MEDICAID TIPS APPLICATION FOR [place]ID [place] Discussed plans with patient and daughter for ongoing care management follow-up and provided direct contact information for care management team. Collaborated with Dr. Betty [name] regarding need for review and signature of Personal Care Service application. Assisted patient and daughter with obtaining information about health plan benefits through [facility]. Provided education to patient and daughter regarding level of care options. Assessed needs, level of care concerns, basic eligibility and provided education on Personal Care Service process and Adult Medicaid application process. Personal Care Services application will be faxed to [employer], once completed and signed by Dr. Betty [name]. LCSW collaboration with [employer] to verify application is received and processed. Identified resources and durable medical equipment needed in the home to improve safety and promote independence. Patient Goals/Self-Care Activities:  Conversed with daughter, [name] who reported that patient will remain in [place] until mid-to-late August of 2022.   Daughter, [name] has LCSW's contact information and agreed to contact LCSW directly if she decides to pursue applying for PCS Youth worker) for you, through [employer]. Remember: Always use handrails on the stairs. Always use your cane or walker when ambulating. Always wear your glasses, especially at night or in areas not well lit.  Remember: Always use handrails on the stairs. Always use your cane or walker when ambulating. Always wear your glasses, especially at night or in areas not well lit.  Follow Up Plan:  No Follow-Up Required.      Follow Up Plan:  No Follow-Up Required.  Nat Christen  LCSW Licensed Clinical Social Worker [place] [phone]

## 2021-06-28 ENCOUNTER — Telehealth: Payer: Self-pay

## 2021-06-28 DIAGNOSIS — R2681 Unsteadiness on feet: Secondary | ICD-10-CM

## 2021-06-28 DIAGNOSIS — Z7409 Other reduced mobility: Secondary | ICD-10-CM

## 2021-06-28 DIAGNOSIS — I739 Peripheral vascular disease, unspecified: Secondary | ICD-10-CM

## 2021-06-28 NOTE — Telephone Encounter (Signed)
Patients daughter called requesting a referral for home health, daughter would like a call back, Inform daughter Dr. [name] is out of the office and someone will contact her at their earliest convenience.

## 2021-07-04 ENCOUNTER — Ambulatory Visit (INDEPENDENT_AMBULATORY_CARE_PROVIDER_SITE_OTHER): Payer: Medicare HMO

## 2021-07-04 DIAGNOSIS — I10 Essential (primary) hypertension: Secondary | ICD-10-CM

## 2021-07-04 DIAGNOSIS — I639 Cerebral infarction, unspecified: Secondary | ICD-10-CM

## 2021-07-04 DIAGNOSIS — E785 Hyperlipidemia, unspecified: Secondary | ICD-10-CM

## 2021-07-04 NOTE — Addendum Note (Signed)
Addended by: Rodrigo Ran on: 07/04/2021 09:28 AM   Modules accepted: Orders
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
## 2021-07-04 NOTE — Patient Instructions (Signed)
Visit Information  PATIENT GOALS:  Goals Addressed             This Visit's Progress    RNCM:Plan for Long Term Care-Stroke/mild cognitive impairment   On track    Timeframe:  Long-Range Goal Priority:  Medium Start Date:  04/07/21                           Expected End Date:      09/12/21                 Follow Up Date 07/29/21    - complete a Power of Attorney; include caregiver - check out other places when staying at home is no longer possible (assisted living center, nursing home) - check out services like in-home help or adult day care - make a list of people who can help and what they can do    Why is this important?   Having and recovering from a stroke can be scary and stressful.  You can reduce stress by planning.  Preparing for the future is one of the most important things to do.  Thinking about how much care you/your loved one will need and how much it will cost is not easy.  You/your loved one can be part of making decisions for the future.  Making sure that your/your loved one's wishes for care are known is important.     Notes:      RNCM:Track and Manage My Blood Pressure-Hypertension   On track    Timeframe:  Long-Range Goal Priority:  High Start Date:     04/07/21                         Expected End Date:   09/12/21                    Follow Up Date 07/29/21    - check blood pressure 3 times per week - choose a place to take my blood pressure (home, clinic or office, retail store) - write blood pressure results in a log or diary    Why is this important?   You won't feel high blood pressure, but it can still hurt your blood vessels.  High blood pressure can cause heart or kidney problems. It can also cause a stroke.  Making lifestyle changes like losing a little weight or eating less salt will help.  Checking your blood pressure at home and at different times of the day can help to control blood pressure.  If the doctor prescribes medicine remember to  take it the way the doctor ordered.  Call the office if you cannot afford the medicine or if there are questions about it.     Notes:         The patient verbalized understanding of instructions, educational materials, and care plan provided today and declined offer to receive copy of patient instructions, educational materials, and care plan.   Telephone follow up appointment with care management team member scheduled for:  Peter Garter RN, [facility], CDE Care Management Coordinator [place] [phone], Mobile [phone]

## 2021-07-04 NOTE — Telephone Encounter (Signed)
I called and spoke with patient's daughter. Patient is needing home health referral due to mobility issues, bathing & eating. Referral placed for home health, requesting PT, OT, and home health aide.

## 2021-07-12 ENCOUNTER — Ambulatory Visit: Payer: Medicare HMO | Admitting: *Deleted

## 2021-07-12 DIAGNOSIS — W19XXXA Unspecified fall, initial encounter: Secondary | ICD-10-CM

## 2021-07-12 DIAGNOSIS — I739 Peripheral vascular disease, unspecified: Secondary | ICD-10-CM

## 2021-07-12 DIAGNOSIS — E785 Hyperlipidemia, unspecified: Secondary | ICD-10-CM | POA: Diagnosis not present

## 2021-07-12 DIAGNOSIS — I10 Essential (primary) hypertension: Secondary | ICD-10-CM | POA: Diagnosis not present

## 2021-07-12 DIAGNOSIS — I639 Cerebral infarction, unspecified: Secondary | ICD-10-CM | POA: Diagnosis not present

## 2021-07-12 DIAGNOSIS — I680 Cerebral amyloid angiopathy: Secondary | ICD-10-CM

## 2021-07-12 DIAGNOSIS — Y92009 Unspecified place in unspecified non-institutional (private) residence as the place of occurrence of the external cause: Secondary | ICD-10-CM

## 2021-07-12 DIAGNOSIS — R2681 Unsteadiness on feet: Secondary | ICD-10-CM

## 2021-07-12 DIAGNOSIS — Z7409 Other reduced mobility: Secondary | ICD-10-CM

## 2021-07-12 NOTE — Chronic Care Management (AMB) (Signed)
Chronic Care Management    Clinical Social Work Note  07/12/2021 Name: Sheila Perez MRN: [id number] DOB: 07/10/1946  [id number] Sheila Perez is a 75 y.o. year old female who is a primary care patient of [name], [name], MD. The CCM team was consulted to assist the patient with chronic disease management and/or care coordination needs related to: Level of Care Concerns.   Engaged with patient's daughter by telephone for follow-up visit in response to provider referral for social work chronic care management and care coordination services.   Consent to Services:  The patient was given information about Chronic Care Management services, agreed to services, and gave verbal consent prior to initiation of services.  Please see initial visit note for detailed documentation.   Patient agreed to services and consent obtained.   Assessment: Review of patient past medical history, allergies, medications, and health status, including review of relevant consultants reports was performed today as part of a comprehensive evaluation and provision of chronic care management and care coordination services.     SDOH (Social Determinants of Health) assessments and interventions performed:    Advanced Directives Status: Not addressed in this encounter.  CCM Care Plan  No Known Allergies  Outpatient Encounter Medications as of 07/12/2021  Medication Sig   amLODipine (NORVASC) 10 MG tablet Take 1 tablet (10 mg total) by mouth daily.   Ascorbic Acid (VITAMIN C) 1000 MG tablet Take 500 mg by mouth daily.   atorvastatin (LIPITOR) 20 MG tablet Take 1 tablet (20 mg total) by mouth daily.   benazepril (LOTENSIN) 10 MG tablet Take 1 tablet (10 mg total) by mouth daily.   cholecalciferol (VITAMIN D3) 25 MCG (1000 UNIT) tablet Take 1,000 Units by mouth daily.   co-enzyme Q-10 30 MG capsule Take 30 mg by mouth daily.   No facility-administered encounter medications on file as of 07/12/2021.    Patient  Active Problem List   Diagnosis Date Noted   Vitamin D deficiency, unspecified 04/18/2021   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CODE) 04/18/2021   Morbid obesity ([place]) 05/19/2020   PAD (peripheral artery disease) ([place]) 05/07/2020   Urinary incontinence in female 02/27/2020   Hyperlipidemia 02/18/2020   Prediabetes 02/18/2020   CVA (cerebral vascular accident) ([place]) 02/18/2020   Hypertension, essential, benign 01/30/2020    Conditions to be addressed/monitored: HTN.  Level of Care Concerns, ADL/IADL Limitations, and Limited Access to Caregiver.  Care Plan : LCSW Plan of Care  Updates made by Francis Gaines, LCSW since 07/12/2021 12:00 AM     Problem: Maintain My Quality of Life by Receiving [place]. Resolved 07/12/2021  Priority: High     Goal: Maintain My Quality of Life by Receiving [facility]. Completed 07/12/2021  Start Date: 04/19/2021  Expected End Date: 07/12/2021  This Visit's Progress: On track  Recent Progress: On track  Priority: High  Note:   Current Barriers:   Patient with Peripheral Artery Disease, Hypertension, Cerebral Vascular Accident, Cerebral Amyloid Angiopathy, Morbid Obesity, History of Falls and Fall Risk needs Support, Education, Resources, Referrals and Care Coordination to resolve unmet personal care needs in the home. Patient is unable to self-administer medications as prescribed, or consistently perform ADL's/IADL's independently. Clinical Goals:  Patient will have in-home care services in place at the expense of [facility].      Patient will demonstrate improved health management independence as evidenced by having in-home care services in place. Clinical Interventions: Collaboration with Primary Care Physician, Dr. Betty [name] regarding development and  update of comprehensive plan of care as evidenced by provider attestation and co-signature. Collaboration with Dr. Carolann Littler, in Primary Care Physician, Dr. Gemma Payor  absence, to request an order for ADL/IADL assistance to submit to [facility] for coverage. Inter-disciplinary care team collaboration (see longitudinal plan of care). Interventions performed:  Problem Solving/Task Centered, Psychoeducation/Health Education, Quality of Sleep Assessed and Sleep Hygiene Techniques Promoted, Caregiver Stress Acknowledged and Consideration of In-Home Care Services Encouraged. Assisted patient and daughter with obtaining information about health plan benefits through [facility]. Patient Goals/Self-Care Activities:  Continue to receive in-home care services for 4 hours per day, 7 days a week, paid for through [facility]. Contact LCSW directly (# [id number]) if you have questions or if additional social work needs are identified in the near future. Follow-Up Plan:  No Follow-Up Required.      Follow-Up Plan:  No Follow-Up Required.  Nat Christen LCSW Licensed Clinical Social Worker [place] [phone]

## 2021-07-12 NOTE — Patient Instructions (Signed)
Visit Information  PATIENT GOALS:  Goals Addressed             This Visit's Progress    COMPLETED: Maintain My Quality of Life by Receiving [place].   On track    Timeframe:  Short-Range Goal Priority:  High Start Date:   04/19/2021                          Expected End Date:  07/12/2021                Follow-Up Date:  No Follow-Up Required.   Patient Goals/Self-Care Activities: Continue to receive in-home care services for 4 hours per day, 7 days a week, paid for through [facility]. Contact LCSW directly (# [phone]) if you have questions or if additional social work needs are identified in the near future.        Patient verbalizes understanding of instructions provided today and agrees to view in [place].   No Follow-Up Required.  Nat Christen LCSW Licensed Clinical Social Worker [place] [phone]

## 2021-07-20 ENCOUNTER — Emergency Department (HOSPITAL_COMMUNITY)
Admission: EM | Admit: 2021-07-20 | Discharge: 2021-07-21 | Disposition: A | Discharge status: Home / Self Care | Payer: Medicare HMO | Attending: Emergency Medicine | Admitting: Emergency Medicine

## 2021-07-20 ENCOUNTER — Other Ambulatory Visit: Payer: Self-pay

## 2021-07-20 DIAGNOSIS — I10 Essential (primary) hypertension: Secondary | ICD-10-CM | POA: Insufficient documentation

## 2021-07-20 DIAGNOSIS — N3001 Acute cystitis with hematuria: Secondary | ICD-10-CM | POA: Diagnosis not present

## 2021-07-20 DIAGNOSIS — R531 Weakness: Secondary | ICD-10-CM | POA: Diagnosis not present

## 2021-07-20 DIAGNOSIS — K625 Hemorrhage of anus and rectum: Secondary | ICD-10-CM | POA: Insufficient documentation

## 2021-07-20 DIAGNOSIS — Z79899 Other long term (current) drug therapy: Secondary | ICD-10-CM | POA: Insufficient documentation

## 2021-07-20 DIAGNOSIS — R58 Hemorrhage, not elsewhere classified: Secondary | ICD-10-CM | POA: Diagnosis not present

## 2021-07-20 LAB — URINALYSIS, ROUTINE W REFLEX MICROSCOPIC
Bilirubin Urine: NEGATIVE
Glucose, UA: NEGATIVE mg/dL
Ketones, ur: 5 mg/dL — AB
Nitrite: NEGATIVE
Protein, ur: 30 mg/dL — AB
RBC / HPF: >50 RBC/hpf — ABNORMAL HIGH (ref 0–5)
Specific Gravity, Urine: 1.02 (ref 1.005–1.030)
WBC, UA: >50 WBC/hpf — ABNORMAL HIGH (ref 0–5)
pH: 6 (ref 5.0–8.0)

## 2021-07-20 LAB — POC OCCULT BLOOD, ED: Fecal Occult Bld: POSITIVE — AB

## 2021-07-20 MED ORDER — SODIUM CHLORIDE 0.9 % IV SOLN
1.0000 g | Freq: Once | INTRAVENOUS | Status: AC
  Administered 2021-07-21: 1 g via INTRAVENOUS
  Filled 2021-07-20: qty 10

## 2021-07-20 MED ORDER — LABETALOL HCL 5 MG/ML IV SOLN
20.0000 mg | Freq: Once | INTRAVENOUS | Status: AC
  Administered 2021-07-21: 20 mg via INTRAVENOUS
  Filled 2021-07-20: qty 4

## 2021-07-20 NOTE — ED Triage Notes (Signed)
Pt stood from sitting on the couch and noticed bright red blood on her pad. Pt states she has noticed blood in her stool before also.

## 2021-07-20 NOTE — ED Provider Notes (Signed)
23:50: Assumed care of patient from Domenic Moras PA-C at change of shift pending labs &disposition.   Please see prior provider note for full H&P.  Briefly patient is a 75 yo female who presents to the ED with complaints of rectal bleeding x2. Blood on pad once and blood with BM.   On prior provider exam patient noted to have a non-thrombosed hemorrhoid, DRE with brown stool, no melena or bright red blood, fecal occult positive.   UA with findings concerning for UTI w/ blood present in urine- prior team started rocephin for this.   Plan @ change of shift is to follow up on labs, if no significant anemia likely discharge home w/ abx for UTI.   CBC: No critical anemia, mild decreased compared to most recent hgb/hct on record but has been 6 months since last labs.  CMP: Mild hyperkalemia.  Lipase: WNL  02:15: I attempted to reassess the patient, she is sleeping in no acute distress, she is able to be awoken, however she refuses to speak with me, she states "leave me alone" and when asked to wake up to discuss her results/care she states "no".   03:00: Re-attempted to discuss with the patient she again refused to converse with me, however I did inform her of her results while awake.   Patient continues to refuse to talk to me directly however she is awake, conversive with nursing staff, and ambulatory. She has not had any episodes of bleeding in the ED per nursing staff, her labs are overall reassuring, mild hyperkalemia will need PCP recheck, she does take an ace inhibitor but does not have renal disease- discussed w/ attending Dr. Roxanne Mins recommends no change in meds & PCP follow up, in agreement with discharge at this time. Will start on keflex for UTI and have her follow up closely with PCP & GI.     Results for orders placed or performed during the hospital encounter of 07/20/21  CBC with Differential  Result Value Ref Range   WBC 9.9 4.0 - 10.5 K/uL   RBC 4.42 3.87 - 5.11 MIL/uL   Hemoglobin  13.3 12.0 - 15.0 g/dL   HCT 40.8 36.0 - 46.0 %   MCV 92.3 80.0 - 100.0 fL   MCH 30.1 26.0 - 34.0 pg   MCHC 32.6 30.0 - 36.0 g/dL   RDW 13.9 11.5 - 15.5 %   Platelets 230 150 - 400 K/uL   nRBC 0.0 0.0 - 0.2 %   Neutrophils Relative % 64 %   Neutro Abs 6.3 1.7 - 7.7 K/uL   Lymphocytes Relative 24 %   Lymphs Abs 2.4 0.7 - 4.0 K/uL   Monocytes Relative 8 %   Monocytes Absolute 0.8 0.1 - 1.0 K/uL   Eosinophils Relative 4 %   Eosinophils Absolute 0.4 0.0 - 0.5 K/uL   Basophils Relative 0 %   Basophils Absolute 0.0 0.0 - 0.1 K/uL   Immature Granulocytes 0 %   Abs Immature Granulocytes 0.03 0.00 - 0.07 K/uL  Comprehensive metabolic panel  Result Value Ref Range   Sodium 142 135 - 145 mmol/L   Potassium 5.3 (H) 3.5 - 5.1 mmol/L   Chloride 109 98 - 111 mmol/L   CO2 26 22 - 32 mmol/L   Glucose, Bld 108 (H) 70 - 99 mg/dL   BUN 19 8 - 23 mg/dL   Creatinine, Ser 0.82 0.44 - 1.00 mg/dL   Calcium 9.4 8.9 - 10.3 mg/dL   Total Protein 7.1 6.5 -  8.1 g/dL   Albumin 3.6 3.5 - 5.0 g/dL   AST 24 15 - 41 U/L   ALT 14 0 - 44 U/L   Alkaline Phosphatase 61 38 - 126 U/L   Total Bilirubin 1.1 0.3 - 1.2 mg/dL   GFR, Estimated >60 >60 mL/min   Anion gap 7 5 - 15  Lipase, blood  Result Value Ref Range   Lipase 22 11 - 51 U/L  Urinalysis, Routine w reflex microscopic Urine, Clean Catch  Result Value Ref Range   Color, Urine YELLOW YELLOW   APPearance HAZY (A) CLEAR   Specific Gravity, Urine 1.020 1.005 - 1.030   pH 6.0 5.0 - 8.0   Glucose, UA NEGATIVE NEGATIVE mg/dL   Hgb urine dipstick LARGE (A) NEGATIVE   Bilirubin Urine NEGATIVE NEGATIVE   Ketones, ur 5 (A) NEGATIVE mg/dL   Protein, ur 30 (A) NEGATIVE mg/dL   Nitrite NEGATIVE NEGATIVE   Leukocytes,Ua LARGE (A) NEGATIVE   RBC / HPF >50 (H) 0 - 5 RBC/hpf   WBC, UA >50 (H) 0 - 5 WBC/hpf   Bacteria, UA RARE (A) NONE SEEN   Squamous Epithelial / LPF 0-5 0 - 5  POC occult blood, ED Provider will collect  Result Value Ref Range   Fecal Occult  Bld POSITIVE (A) NEGATIVE   No results found.    Amaryllis Dyke, PA-C [id number] [phone]    Delora Fuel, MD [id number] [phone]

## 2021-07-20 NOTE — ED Provider Notes (Signed)
[facility] DEPT Provider Note   CSN: [id number] Arrival date & time: 07/20/21  2041     History Chief Complaint  Patient presents with   Rectal Bleeding    Sheila Perez is a 75 y.o. female.  The history is provided by the patient and medical records. No language interpreter was used.  Rectal Bleeding Quality:  Bright red   75 year old female with history of hypertension, hyperlipidemia, prior stroke, obesity brought here via EMS from home for evaluation of rectal bleeding.  Patient states this morning when she got off her couch she noticed blood on her chair.  Later on when she had a bowel movement she also noticed bright red bleeding into the toilet bowl.  She states she is not sure if there is blood mixed with feces.  She does not have any other symptoms.  She denies any fever lightheadedness dizziness chest pain shortness of breath abdominal pain rectal pain rectal itchiness or any recent trauma.  She denies having history of rectal bleeding.  She normally have bowel movements twice daily and this is normal for her.  She usually walks with a walker or cane.  No prior history of hemorrhoid history of GI disease.  She is not on any blood thinner medication.    Past Medical History:  Diagnosis Date   Hyperlipidemia    Hypertension     Patient Active Problem List   Diagnosis Date Noted   Vitamin D deficiency, unspecified 04/18/2021   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CODE) 04/18/2021   Morbid obesity ([place]) 05/19/2020   PAD (peripheral artery disease) ([place]) 05/07/2020   Urinary incontinence in female 02/27/2020   Hyperlipidemia 02/18/2020   Prediabetes 02/18/2020   CVA (cerebral vascular accident) ([place]) 02/18/2020   Hypertension, essential, benign 01/30/2020    Past Surgical History:  Procedure Laterality Date   ABDOMINAL HYSTERECTOMY     maxilo facial surgery       OB History   No obstetric history on file.     Family History   Problem Relation Age of Onset   Kidney disease Mother    Diabetes Mother    Alcohol abuse Father    Diabetes Sister    Diabetes Daughter    Diabetes Sister     Social History   Tobacco Use   Smoking status: Never   Smokeless tobacco: Never  Vaping Use   Vaping Use: Never used  Substance Use Topics   Alcohol use: No   Drug use: No    Home Medications Prior to Admission medications   Medication Sig Start Date End Date Taking? Authorizing Provider  amLODipine (NORVASC) 10 MG tablet Take 1 tablet (10 mg total) by mouth daily. 02/18/20   [name], [name], MD  Ascorbic Acid (VITAMIN C) 1000 MG tablet Take 500 mg by mouth daily.    [provider]  atorvastatin (LIPITOR) 20 MG tablet Take 1 tablet (20 mg total) by mouth daily. 02/18/20   [name], [name], MD  benazepril (LOTENSIN) 10 MG tablet Take 1 tablet (10 mg total) by mouth daily. 02/27/20   [name], [name], MD  cholecalciferol (VITAMIN D3) 25 MCG (1000 UNIT) tablet Take 1,000 Units by mouth daily.    [provider]  co-enzyme Q-10 30 MG capsule Take 30 mg by mouth daily.    [provider]    Allergies    Patient has no known allergies.  Review of Systems   Review of Systems  Gastrointestinal:  Positive for hematochezia.  All other systems reviewed and are negative.  Physical Exam Updated Vital Signs BP (!) 221/124 (BP Location: Right Arm)   Pulse 93   Temp 97.9 F (36.6 C) (Oral)   Resp (!) 22   Ht '5\' 3"'$  (1.6 m)   Wt 94.3 kg   SpO2 99%   BMI 36.85 kg/m   Physical Exam Vitals and nursing note reviewed.  Constitutional:      General: She is not in acute distress.    Appearance: She is well-developed.  HENT:     Head: Atraumatic.  Eyes:     Conjunctiva/sclera: Conjunctivae normal.  Cardiovascular:     Rate and Rhythm: Normal rate and regular rhythm.     Pulses: Normal pulses.     Heart sounds: Normal heart sounds.  Pulmonary:     Effort: Pulmonary effort is normal.  Abdominal:      Palpations: Abdomen is soft.  Genitourinary:    Comments: Chaperone present during exam.  External nonthrombosed hemorrhoid noted.  Normal rectal tone, no obvious mass on exam, normal color stool on glove.  Fecal occult blood test positive Musculoskeletal:     Cervical back: Neck supple.  Skin:    Findings: No rash.  Neurological:     Mental Status: She is alert and oriented to person, place, and time.  Psychiatric:        Mood and Affect: Mood normal.    ED Results / Procedures / Treatments   Labs (all labs ordered are listed, but only abnormal results are displayed) Labs Reviewed  URINE CULTURE - Abnormal; Notable for the following components:      Result Value   Culture   (*)    Value: 60,000 COLONIES/mL ESCHERICHIA COLI 40,000 COLONIES/mL PROTEUS MIRABILIS SUSCEPTIBILITIES TO FOLLOW Performed at [facility], [address] [place], [place] [address]    All other components within normal limits  COMPREHENSIVE METABOLIC PANEL - Abnormal; Notable for the following components:   Potassium 5.3 (*)    Glucose, Bld 108 (*)    All other components within normal limits  URINALYSIS, ROUTINE W REFLEX MICROSCOPIC - Abnormal; Notable for the following components:   APPearance HAZY (*)    Hgb urine dipstick LARGE (*)    Ketones, ur 5 (*)    Protein, ur 30 (*)    Leukocytes,Ua LARGE (*)    RBC / HPF >50 (*)    WBC, UA >50 (*)    Bacteria, UA RARE (*)    All other components within normal limits  POC OCCULT BLOOD, ED - Abnormal; Notable for the following components:   Fecal Occult Bld POSITIVE (*)    All other components within normal limits  CBC WITH DIFFERENTIAL/PLATELET  LIPASE, BLOOD    EKG None  Radiology No results found.  Procedures Procedures   Medications Ordered in ED Medications  labetalol (NORMODYNE) injection 20 mg (20 mg Intravenous Given 07/21/21 0040)  cefTRIAXone (ROCEPHIN) 1 g in sodium chloride 0.9 % 100 mL IVPB (0 g Intravenous Stopped  07/21/21 0307)    ED Course  I have reviewed the triage vital signs and the nursing notes.  Pertinent labs & imaging results that were available during my care of the patient were reviewed by me and considered in my medical decision making (see chart for details).    MDM Rules/Calculators/A&P  BP (!) 172/96   Pulse 87   Temp 97.9 F (36.6 C) (Oral)   Resp (!) 23   Ht '5\' 3"'$  (1.6 m)   Wt 94.3 kg   SpO2 99%   BMI 36.85 kg/m   Final Clinical Impression(s) / ED Diagnoses Final diagnoses:  None    Rx / DC Orders ED Discharge Orders     None      Patient report several episodes of bright red blood per rectum started today.  No other complaint.  She is not on any blood thinner medication.  She is a poor historian however she does not have any reproducible abdominal pain on exam.  Rectal exam reveals evidence of nonthrombosed external hemorrhoid with normal color stool and no obvious bleeding noted however fecal occult blood test is positive.  Initially she was hypotensive with systolic blood pressure of 225.  She did not take her blood pressure medication today, 20 of labetalol was given, recheck blood pressure is 189/105.  Urinalysis shows hazy appearance with large she hemoglobin and urine dipsticks as well as large leukocyte esterase and greater than 50 WBC.  Patient is at risk for having urinary tract infection and therefore urine culture sent, antibiotics started.  Patient signed out to oncoming provider who will follow up on the remainder of her labs and reassess.   Domenic Moras, PA-C 07/22/21 [place]    Isla Pence, MD 07/23/21 [phone]

## 2021-07-21 ENCOUNTER — Other Ambulatory Visit: Payer: Self-pay | Admitting: Family Medicine

## 2021-07-21 DIAGNOSIS — I639 Cerebral infarction, unspecified: Secondary | ICD-10-CM

## 2021-07-21 DIAGNOSIS — E785 Hyperlipidemia, unspecified: Secondary | ICD-10-CM

## 2021-07-21 LAB — CBC WITH DIFFERENTIAL/PLATELET
Abs Immature Granulocytes: 0.03 10*3/uL (ref 0.00–0.07)
Basophils Absolute: 0 10*3/uL (ref 0.0–0.1)
Basophils Relative: 0 %
Eosinophils Absolute: 0.4 10*3/uL (ref 0.0–0.5)
Eosinophils Relative: 4 %
HCT: 40.8 % (ref 36.0–46.0)
Hemoglobin: 13.3 g/dL (ref 12.0–15.0)
Immature Granulocytes: 0 %
Lymphocytes Relative: 24 %
Lymphs Abs: 2.4 10*3/uL (ref 0.7–4.0)
MCH: 30.1 pg (ref 26.0–34.0)
MCHC: 32.6 g/dL (ref 30.0–36.0)
MCV: 92.3 fL (ref 80.0–100.0)
Monocytes Absolute: 0.8 10*3/uL (ref 0.1–1.0)
Monocytes Relative: 8 %
Neutro Abs: 6.3 10*3/uL (ref 1.7–7.7)
Neutrophils Relative %: 64 %
Platelets: 230 10*3/uL (ref 150–400)
RBC: 4.42 MIL/uL (ref 3.87–5.11)
RDW: 13.9 % (ref 11.5–15.5)
WBC: 9.9 10*3/uL (ref 4.0–10.5)
nRBC: 0 % (ref 0.0–0.2)

## 2021-07-21 LAB — COMPREHENSIVE METABOLIC PANEL
ALT: 14 U/L (ref 0–44)
AST: 24 U/L (ref 15–41)
Albumin: 3.6 g/dL (ref 3.5–5.0)
Alkaline Phosphatase: 61 U/L (ref 38–126)
Anion gap: 7 (ref 5–15)
BUN: 19 mg/dL (ref 8–23)
CO2: 26 mmol/L (ref 22–32)
Calcium: 9.4 mg/dL (ref 8.9–10.3)
Chloride: 109 mmol/L (ref 98–111)
Creatinine, Ser: 0.82 mg/dL (ref 0.44–1.00)
GFR, Estimated: >60 mL/min (ref >60)
Glucose, Bld: 108 mg/dL — ABNORMAL HIGH (ref 70–99)
Potassium: 5.3 mmol/L — ABNORMAL HIGH (ref 3.5–5.1)
Sodium: 142 mmol/L (ref 135–145)
Total Bilirubin: 1.1 mg/dL (ref 0.3–1.2)
Total Protein: 7.1 g/dL (ref 6.5–8.1)

## 2021-07-21 LAB — LIPASE, BLOOD: Lipase: 22 U/L (ref 11–51)

## 2021-07-21 MED ORDER — CEPHALEXIN 500 MG PO CAPS: 500.0000 mg | ORAL_CAPSULE | Freq: Three times a day (TID) | ORAL | 0 refills | Status: DC

## 2021-07-21 NOTE — Discharge Instructions (Addendum)
You were seen in the ER today for rectal bleeding.  You did have positive blood on your rectal exam- for this reason we would like you to follow up closely with a GI doctor- please call the office today to schedule a follow up for as soon as possible.  Your urine showed findings of infection- we are starting you on keflex which is an antibiotic to treat this infection.   We have prescribed you new medication(s) today. Discuss the medications prescribed today with your pharmacist as they can have adverse effects and interactions with your other medicines including over the counter and prescribed medications. Seek medical evaluation if you start to experience new or abnormal symptoms after taking one of these medicines, seek care immediately if you start to experience difficulty breathing, feeling of your throat closing, facial swelling, or rash as these could be indications of a more serious allergic reaction  Your labs showed your potassium is elevated- please have this rechecked by your primary care provider as soon as possible.   Follow-up with GI and primary care soon as possible.  Return to the emergency department for new or worsening symptoms including but not limited to lightheadedness, dizziness, passing out, chest pain, trouble breathing, abdominal pain, inability to keep fluids down, recurrent episodes of rectal bleeding, dark/tarry stool, fever, or any other concerns.  Please be sure to take your blood pressure medication as prescribed and have your blood pressure rechecked by primary care as it was elevated in the ER today.

## 2021-07-22 ENCOUNTER — Other Ambulatory Visit: Payer: Self-pay

## 2021-07-22 ENCOUNTER — Telehealth: Payer: Self-pay

## 2021-07-22 DIAGNOSIS — I10 Essential (primary) hypertension: Secondary | ICD-10-CM

## 2021-07-22 MED ORDER — AMLODIPINE BESYLATE 10 MG PO TABS: 10.0000 mg | ORAL_TABLET | Freq: Every day | ORAL | 1 refills | Status: DC

## 2021-07-22 NOTE — Telephone Encounter (Signed)
Transition Care Management Unsuccessful Follow-up Telephone Call  Date of discharge and from where:  [facility] 07/21/2021  Attempts:  1st Attempt  Reason for unsuccessful TCM follow-up call:  Unable to reach patient

## 2021-07-23 LAB — URINE CULTURE: Culture: 60000 — AB

## 2021-07-29 ENCOUNTER — Ambulatory Visit (INDEPENDENT_AMBULATORY_CARE_PROVIDER_SITE_OTHER): Payer: Medicare HMO

## 2021-07-29 DIAGNOSIS — I680 Cerebral amyloid angiopathy: Secondary | ICD-10-CM

## 2021-07-29 DIAGNOSIS — I10 Essential (primary) hypertension: Secondary | ICD-10-CM

## 2021-07-29 DIAGNOSIS — W19XXXA Unspecified fall, initial encounter: Secondary | ICD-10-CM

## 2021-07-29 DIAGNOSIS — E785 Hyperlipidemia, unspecified: Secondary | ICD-10-CM

## 2021-07-29 DIAGNOSIS — R7303 Prediabetes: Secondary | ICD-10-CM

## 2021-07-29 NOTE — Patient Instructions (Signed)
Visit Information  PATIENT GOALS:  Goals Addressed             This Visit's Progress    RNCM:Plan for Long Term Care-Stroke/mild cognitive impairment   On track    Timeframe:  Long-Range Goal Priority:  Medium Start Date:  04/07/21                           Expected End Date:      09/12/21                 Follow Up Date 09/09/21    - complete a Power of Attorney; include caregiver - check out other places when staying at home is no longer possible (assisted living center, nursing home) - check out services like in-home help or adult day care - make a list of people who can help and what they can do    Why is this important?   Having and recovering from a stroke can be scary and stressful.  You can reduce stress by planning.  Preparing for the future is one of the most important things to do.  Thinking about how much care you/your loved one will need and how much it will cost is not easy.  You/your loved one can be part of making decisions for the future.  Making sure that your/your loved one's wishes for care are known is important.     Notes:      RNCM:Prevent Falls   On track    Timeframe:  Long-Range Goal Priority:  Medium Start Date:      07/29/21                       Expected End Date:     01/11/22                  Follow Up Date 09/09/21    - always wear shoes or slippers with non-slip sole - install bathroom grab bars - keep a flashlight by the bed - make an emergency alert plan in case I fall - remove throw rugs or use nonslip pads - use a cane or walker - use nightlight in the bathroom, halls - always wear low-heeled or flat shoes or slippers with nonskid soles    Why is this important?   There may be trouble with balance and getting around. Falls can happen.    Notes:      RNCM:Track and Manage My Blood Pressure-Hypertension   On track    Timeframe:  Long-Range Goal Priority:  High Start Date:     04/07/21                         Expected End Date:    09/12/21                    Follow Up Date 09/09/21    - check blood pressure 3 times per week - choose a place to take my blood pressure (home, clinic or office, retail store) - write blood pressure results in a log or diary    Why is this important?   You won't feel high blood pressure, but it can still hurt your blood vessels.  High blood pressure can cause heart or kidney problems. It can also cause a stroke.  Making lifestyle changes like losing a little weight or eating less salt will  help.  Checking your blood pressure at home and at different times of the day can help to control blood pressure.  If the doctor prescribes medicine remember to take it the way the doctor ordered.  Call the office if you cannot afford the medicine or if there are questions about it.     Notes:        It is important to avoid accidents which may result in broken bones.  Here are a few ideas on how to make your home safer so you will be less likely to trip or fall.  Use nonskid mats or non slip strips in your shower or tub, on your bathroom floor and around sinks.  If you know that you have spilled water, wipe it up! In the bathroom, it is important to have properly installed grab bars on the walls or on the edge of the tub.  Towel racks are NOT strong enough for you to hold onto or to pull on for support. Stairs and hallways should have enough light.  Add lamps or night lights if you need ore light. It is good to have handrails on both sides of the stairs if possible.  Always fix broken handrails right away. It is important to see the edges of steps.  Paint the edges of outdoor steps white so you can see them better.  Put colored tape on the edge of inside steps. Throw-rugs are dangerous because they can slide.  Removing the rugs is the best idea, but if they must stay, add adhesive carpet tape to prevent slipping. Do not keep things on stairs or in the halls.  Remove small furniture that blocks the  halls as it may cause you to trip.  Keep telephone and electrical cords out of the way where you walk. Always were sturdy, rubber-soled shoes for good support.  Never wear just socks, especially on the stairs.  Socks may cause you to slip or fall.  Do not wear full-length housecoats as you can easily trip on the bottom.  Place the things you use the most on the shelves that are the easiest to reach.  If you use a stepstool, make sure it is in good condition.  If you feel unsteady, DO NOT climb, ask for help. If a health professional advises you to use a cane or walker, do not be ashamed.  These items can keep you from falling and breaking your bones. The patient verbalized understanding of instructions, educational materials, and care plan provided today and declined offer to receive copy of patient instructions, educational materials, and care plan.   Telephone follow up appointment with care management team member scheduled for:  Peter Garter RN, [facility], CDE Care Management Coordinator [place] [phone], Mobile [phone]
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
## 2021-07-30 DIAGNOSIS — I739 Peripheral vascular disease, unspecified: Secondary | ICD-10-CM | POA: Diagnosis not present

## 2021-07-30 DIAGNOSIS — E785 Hyperlipidemia, unspecified: Secondary | ICD-10-CM | POA: Diagnosis not present

## 2021-07-30 DIAGNOSIS — F0390 Unspecified dementia without behavioral disturbance: Secondary | ICD-10-CM | POA: Diagnosis not present

## 2021-07-30 DIAGNOSIS — I10 Essential (primary) hypertension: Secondary | ICD-10-CM | POA: Diagnosis not present

## 2021-07-30 DIAGNOSIS — R7303 Prediabetes: Secondary | ICD-10-CM | POA: Diagnosis not present

## 2021-07-30 DIAGNOSIS — N3000 Acute cystitis without hematuria: Secondary | ICD-10-CM | POA: Diagnosis not present

## 2021-07-30 DIAGNOSIS — K625 Hemorrhage of anus and rectum: Secondary | ICD-10-CM | POA: Diagnosis not present

## 2021-07-30 DIAGNOSIS — E559 Vitamin D deficiency, unspecified: Secondary | ICD-10-CM | POA: Diagnosis not present

## 2021-08-01 ENCOUNTER — Telehealth: Payer: Self-pay | Admitting: Family Medicine

## 2021-08-01 ENCOUNTER — Encounter: Payer: Self-pay | Admitting: Family Medicine

## 2021-08-01 ENCOUNTER — Other Ambulatory Visit: Payer: Self-pay

## 2021-08-01 ENCOUNTER — Telehealth: Payer: Self-pay

## 2021-08-01 ENCOUNTER — Ambulatory Visit (INDEPENDENT_AMBULATORY_CARE_PROVIDER_SITE_OTHER): Payer: Medicare HMO | Admitting: Family Medicine

## 2021-08-01 VITALS — BP 130/80 | HR 88 | Resp 16 | Ht 63.0 in | Wt 206.2 lb

## 2021-08-01 DIAGNOSIS — I10 Essential (primary) hypertension: Secondary | ICD-10-CM

## 2021-08-01 DIAGNOSIS — E875 Hyperkalemia: Secondary | ICD-10-CM | POA: Diagnosis not present

## 2021-08-01 DIAGNOSIS — R32 Unspecified urinary incontinence: Secondary | ICD-10-CM

## 2021-08-01 DIAGNOSIS — R7303 Prediabetes: Secondary | ICD-10-CM

## 2021-08-01 DIAGNOSIS — I739 Peripheral vascular disease, unspecified: Secondary | ICD-10-CM

## 2021-08-01 DIAGNOSIS — K625 Hemorrhage of anus and rectum: Secondary | ICD-10-CM

## 2021-08-01 DIAGNOSIS — Z7409 Other reduced mobility: Secondary | ICD-10-CM | POA: Diagnosis not present

## 2021-08-01 DIAGNOSIS — E785 Hyperlipidemia, unspecified: Secondary | ICD-10-CM | POA: Diagnosis not present

## 2021-08-01 NOTE — Assessment & Plan Note (Addendum)
BMI 36. Co morbilities: HTN,PAD,HLD. We discussed benefits of wt loss as well as adverse effects of obesity. Consistency with healthy diet and low impact physical activity as tolerated encouraged.

## 2021-08-01 NOTE — Assessment & Plan Note (Addendum)
BP adequately controlled. Continue current management: Amlodipine 10 mg daily and Benazepril 10 mg daily. DASH/low salt diet to continue. Monitor BP at home.

## 2021-08-01 NOTE — Telephone Encounter (Signed)
Sheila Perez from [facility] called to provide verbal orders. Olin Hauser stated skilled nursing for management of peripheral vascular disease unspecified and medication management. Patient is currently being treated for an UTI. Olin Hauser is following her at a frequency of 1 week 1 2 week 2 1 week 1 2 times a month for 1. She is also following 2 prn and wants to add in physical therapy.  Olin Hauser could be contacted at [phone].  Please advise.

## 2021-08-01 NOTE — Assessment & Plan Note (Signed)
We discussed ABI result. Continue Atorvastatin 20 mg daily. 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

## 2021-08-01 NOTE — Assessment & Plan Note (Addendum)
Continue Atorvastatin 20 mg daily and low fat diet. She is not fasting today. Will plan on checking FLP next visit.

## 2021-08-01 NOTE — Assessment & Plan Note (Signed)
A healthy life style and wt loss recommended for diabetes prevention. Further recommendations will be given according to HgA1C result.

## 2021-08-01 NOTE — Patient Instructions (Addendum)
A few things to remember from today's visit:   Rectal bleeding - Plan: Ambulatory referral to Gastroenterology  Hyperlipidemia, unspecified hyperlipidemia type  Hypertension, essential, benign  Limited mobility - Plan: Ambulatory referral to Home Health  Hyperkalemia - Plan: Potassium, Potassium  Prediabetes - Plan: Hemoglobin A1c, Hemoglobin A1c  Physical deconditioning  If you need refills please call your pharmacy. Do not use My Chart to request refills or for acute issues that need immediate attention. Continue working on a healthier life style. Gastro appt is being arranged.   Home health referral for personal care services placed. No changes today.  Please be sure medication list is accurate. If a new problem present, please set up appointment sooner than planned today.

## 2021-08-01 NOTE — Telephone Encounter (Signed)
Verbal given to [place].

## 2021-08-01 NOTE — Progress Notes (Signed)
Chief Complaint  Patient presents with   Follow-up   HPI: [name] is a 75 y.o. female, who is here today with her daughter for follow up.  Since her last visit she was in the ED for rectal bleeding, 07/20/21.  She was last seen on 04/18/21. She was overseas visiting her husband, came back sooner than planned. While overseas,she is not exercise regularly and was not consistent with following a healthful diet. States that her husband did "everything" for her, so she did not move much. She gained wt and mobility very limited.  Her daughter cooks and started her on a healthier diet, low carb. It is difficult for her to exercise because unstable gait. Pending PT and OT.  Her daughter is requesting Rx's for wheel chair,walker,shower chair,and pure wick urine incontinence supplies. She needs help with ADL's while her daughter is working. According to daughter,she has a few hours of personal aid coverage thought her insurance, a referral needs to be placed.  Hypertension:  Medications:Benazepril 10 mg daily and Amlodipine 10 mg daily. Side effects:None.  Negative for unusual or severe headache, visual changes, exertional chest pain, dyspnea,  focal weakness, or worsening edema.  Lab Results  Component Value Date   CREATININE 0.82 07/21/2021   BUN 19 07/21/2021   NA 142 07/21/2021   K 5.3 (H) 07/21/2021   CL 109 07/21/2021   CO2 26 07/21/2021   PAD:ABI done on 05/07/21. Right: Resting right ankle-brachial index indicates mild right lower extremity arterial disease. The right toe-brachial index is abnormal. RT great toe pressure = 129 mmHg.  Left: Resting left ankle-brachial index indicates mild left lower extremity arterial disease. The left toe-brachial index is abnormal. LT Great toe pressure = 118 mmHg.   She is on Atorvastatin 20 mg daily. Lab Results  Component Value Date   CHOL 249 (H) 04/18/2021   HDL 45.10 04/18/2021   LDLCALC 188 (H) 04/18/2021    TRIG 78.0 04/18/2021   CHOLHDL 6 04/18/2021   CVA with residual slurred speech. Problem has been stable, interested in speech therapy through [facility]. Negative for dysphagia. Follows with neurologist.  Evaluated in the ED because rectal bleed x 2. She was having some constipation when fist arrived from [place] but bowel habits back to her normal.  No other episodes of bleeding. Last bowel movement yesterday. No associated abdominal pain or dyschezia. Overdue for colon cancer screening.  Lab Results  Component Value Date   WBC 9.9 07/21/2021   HGB 13.3 07/21/2021   HCT 40.8 07/21/2021   MCV 92.3 07/21/2021   PLT 230 07/21/2021   Prediabetes:Negative for polydipsia,polyuria, or polyphagia. Last HgA1C was 6.3 in 04/2021. Her daughter would like A1C re-check today.  Review of Systems  Constitutional:  Positive for activity change and fatigue. Negative for appetite change and fever.  HENT:  Negative for mouth sores, nosebleeds and sore throat.   Respiratory:  Negative for cough and wheezing.   Gastrointestinal:  Negative for nausea and vomiting.  Genitourinary:  Negative for decreased urine volume, dysuria and hematuria.  Musculoskeletal:  Positive for arthralgias and gait problem.  Skin:  Negative for pallor and rash.  Neurological:  Positive for speech difficulty. Negative for syncope, facial asymmetry and weakness.  Psychiatric/Behavioral:  Negative for confusion.   Rest of ROS, see pertinent positives sand negatives in HPI  Current Outpatient Medications on File Prior to Visit  Medication Sig Dispense Refill   amLODipine (NORVASC) 10 MG tablet Take 1 tablet (10  mg total) by mouth daily. 90 tablet 1   Ascorbic Acid (VITAMIN C) 1000 MG tablet Take 500 mg by mouth daily.     atorvastatin (LIPITOR) 20 MG tablet TAKE 1 TABLET EVERY DAY 90 tablet 3   benazepril (LOTENSIN) 10 MG tablet Take 1 tablet (10 mg total) by mouth daily. 30 tablet 1   cephALEXin (KEFLEX) 500 MG capsule Take 1  capsule (500 mg total) by mouth 3 (three) times daily. 21 capsule 0   cholecalciferol (VITAMIN D3) 25 MCG (1000 UNIT) tablet Take 1,000 Units by mouth daily.     co-enzyme Q-10 30 MG capsule Take 30 mg by mouth daily.     No current facility-administered medications on file prior to visit.   Past Medical History:  Diagnosis Date   Hyperlipidemia    Hypertension    No Known Allergies  Social History   Socioeconomic History   Marital status: Widowed    Spouse name: Not on file   Number of children: 2   Years of education: 10   Highest education level: 10th grade  Occupational History   Occupation: Retired  Tobacco Use   Smoking status: Never   Smokeless tobacco: Never  Vaping Use   Vaping Use: Never used  Substance and Sexual Activity   Alcohol use: No   Drug use: No   Sexual activity: Not Currently  Other Topics Concern   Not on file  Social History Narrative   Right handed   Lives daughter in a two story home but lives on first floor   Drinks no caffeine    Social Determinants of Health   Financial Resource Strain: Low Risk    Difficulty of Paying Living Expenses: Not hard at all  Food Insecurity: No Food Insecurity   Worried About Charity fundraiser in the Last Year: Never true   Arboriculturist in the Last Year: Never true  Transportation Needs: No Transportation Needs   Lack of Transportation (Medical): No   Lack of Transportation (Non-Medical): No  Physical Activity: Inactive   Days of Exercise per Week: 0 days   Minutes of Exercise per Session: 0 min  Stress: No Stress Concern Present   Feeling of Stress : Not at all  Social Connections: Socially Isolated   Frequency of Communication with Friends and Family: More than three times a week   Frequency of Social Gatherings with Friends and Family: More than three times a week   Attends Religious Services: Never   Marine scientist or Organizations: No   Attends Archivist Meetings: Never    Marital Status: Widowed   Vitals:   08/01/21 1543  BP: 130/80  Pulse: 88  Resp: 16  SpO2: 99%   Body mass index is 36.54 kg/m.  Physical Exam Vitals and nursing note reviewed.  Constitutional:      General: She is not in acute distress.    Appearance: She is well-developed.  HENT:     Head: Normocephalic and atraumatic.     Mouth/Throat:     Mouth: Mucous membranes are moist.     Pharynx: Oropharynx is clear.  Eyes:     Conjunctiva/sclera: Conjunctivae normal.  Cardiovascular:     Rate and Rhythm: Normal rate and regular rhythm.     Heart sounds: No murmur heard.    Comments: DP pulses present. Pulmonary:     Effort: Pulmonary effort is normal. No respiratory distress.     Breath sounds: Normal breath  sounds.  Abdominal:     Palpations: Abdomen is soft.     Tenderness: There is no abdominal tenderness.  Lymphadenopathy:     Cervical: No cervical adenopathy.  Skin:    General: Skin is warm.     Findings: No erythema or rash.  Neurological:     General: No focal deficit present.     Mental Status: She is alert and oriented to person, place, and time.     Cranial Nerves: No cranial nerve deficit.     Gait: Gait normal.     Comments: Unstable gait, she needs assistance. She is in a wheel chair during visit.   ASSESSMENT AND PLAN:  [name] was seen today for follow-up.  Orders Placed This Encounter  Procedures   Hemoglobin A1c   Potassium   Ambulatory referral to Gastroenterology   Ambulatory referral to [facility]   Lab Results  Component Value Date   HGBA1C 6.2 08/01/2021   Rectal bleeding Resolved. We discussed possible etiologies. ? Internal hemorrhoids. GI referral placed. Instructed about warning signs.  Limited mobility HH referral placed for personal care assistance as requested. Continue PT and OT through [facility]. Fall precautions.  Hyperkalemia Mild. Will re-check K+ and recommendations will be given accordingly. Caution  with K+ rich diet.  Prediabetes A healthy life style and wt loss recommended for diabetes prevention. Further recommendations will be given according to HgA1C result.  PAD (peripheral artery disease) (HCC) We discussed ABI result. Continue Atorvastatin 20 mg daily. 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   Hypertension, essential, benign BP adequately controlled. Continue current management: Amlodipine 10 mg daily and Benazepril 10 mg daily. DASH/low salt diet to continue. Monitor BP at home.  Urinary incontinence in female Rx for urine incontinence supplies given.  Hyperlipidemia Continue Atorvastatin 20 mg daily and low fat diet. She is not fasting today. Will plan on checking FLP next visit.  Morbid obesity (HCC) BMI 36. Co morbilities: HTN,PAD,HLD. We discussed benefits of wt loss as well as adverse effects of obesity. Consistency with healthy diet and low impact physical activity as tolerated encouraged.  I spent a total of 42 minutes in both face to face and non face to face activities for this visit on the date of this encounter. During this time history was obtained and documented, examination was performed, prior labs reviewed, and assessment/plan discussed.  Return in about 3 months (around 10/31/2021).   Ike Maragh G. [name], MD  [facility]. [place] office.

## 2021-08-01 NOTE — Assessment & Plan Note (Signed)
Rx for urine incontinence supplies given.

## 2021-08-01 NOTE — Telephone Encounter (Signed)
Transition Care Management Unsuccessful Follow-up Telephone Call  Date of discharge and from where: 07/21/2021  Sheila Perez   Attempts:  2nd Attempt  Reason for unsuccessful TCM follow-up call:  Unable to reach patient

## 2021-08-02 ENCOUNTER — Telehealth: Payer: Self-pay | Admitting: Family Medicine

## 2021-08-02 DIAGNOSIS — N3000 Acute cystitis without hematuria: Secondary | ICD-10-CM | POA: Diagnosis not present

## 2021-08-02 DIAGNOSIS — F0390 Unspecified dementia without behavioral disturbance: Secondary | ICD-10-CM | POA: Diagnosis not present

## 2021-08-02 DIAGNOSIS — E559 Vitamin D deficiency, unspecified: Secondary | ICD-10-CM | POA: Diagnosis not present

## 2021-08-02 DIAGNOSIS — I10 Essential (primary) hypertension: Secondary | ICD-10-CM | POA: Diagnosis not present

## 2021-08-02 DIAGNOSIS — K625 Hemorrhage of anus and rectum: Secondary | ICD-10-CM | POA: Diagnosis not present

## 2021-08-02 DIAGNOSIS — I739 Peripheral vascular disease, unspecified: Secondary | ICD-10-CM | POA: Diagnosis not present

## 2021-08-02 DIAGNOSIS — R7303 Prediabetes: Secondary | ICD-10-CM | POA: Diagnosis not present

## 2021-08-02 DIAGNOSIS — E785 Hyperlipidemia, unspecified: Secondary | ICD-10-CM | POA: Diagnosis not present

## 2021-08-02 LAB — HEMOGLOBIN A1C: Hgb A1c MFr Bld: 6.2 % (ref 4.6–6.5)

## 2021-08-02 LAB — POTASSIUM: Potassium: 4.3 mEq/L (ref 3.5–5.1)

## 2021-08-02 NOTE — Telephone Encounter (Signed)
I left the verbal on [employer].

## 2021-08-02 NOTE — Telephone Encounter (Signed)
PT daughter called to follow up on the non skill home health orders that they had discuss yesterday with the Dr. She would like to have the orders fax to [phone].

## 2021-08-02 NOTE — Telephone Encounter (Signed)
Sheila Perez from [facility] called to request the following verbal orders and would like a callback asap:  [facility] for Physical Therapy once a week for 9 weeks to work on strength, balancing, and walking  Speech Therapy Evaluation

## 2021-08-03 NOTE — Telephone Encounter (Signed)
Mistake.

## 2021-08-05 NOTE — Telephone Encounter (Signed)
I left pt's daughter a voicemail letting her know that I just need to know which services pt needs to add onto the order before we can fax it.

## 2021-08-05 NOTE — Telephone Encounter (Signed)
In your bin to be signed.

## 2021-08-05 NOTE — Telephone Encounter (Signed)
I spoke with pt's daughter, she was in the middle of class. Will return call at 10am to see what other non skill home health orders they need.

## 2021-08-06 DIAGNOSIS — F0390 Unspecified dementia without behavioral disturbance: Secondary | ICD-10-CM | POA: Diagnosis not present

## 2021-08-06 DIAGNOSIS — I739 Peripheral vascular disease, unspecified: Secondary | ICD-10-CM | POA: Diagnosis not present

## 2021-08-06 DIAGNOSIS — K625 Hemorrhage of anus and rectum: Secondary | ICD-10-CM | POA: Diagnosis not present

## 2021-08-06 DIAGNOSIS — R7303 Prediabetes: Secondary | ICD-10-CM | POA: Diagnosis not present

## 2021-08-06 DIAGNOSIS — E785 Hyperlipidemia, unspecified: Secondary | ICD-10-CM | POA: Diagnosis not present

## 2021-08-06 DIAGNOSIS — I10 Essential (primary) hypertension: Secondary | ICD-10-CM | POA: Diagnosis not present

## 2021-08-06 DIAGNOSIS — E559 Vitamin D deficiency, unspecified: Secondary | ICD-10-CM | POA: Diagnosis not present

## 2021-08-06 DIAGNOSIS — N3000 Acute cystitis without hematuria: Secondary | ICD-10-CM | POA: Diagnosis not present

## 2021-08-10 ENCOUNTER — Emergency Department (HOSPITAL_COMMUNITY)
Admission: EM | Admit: 2021-08-10 | Discharge: 2021-08-10 | Disposition: A | Discharge status: Home / Self Care | Payer: Medicare HMO | Attending: Emergency Medicine | Admitting: Emergency Medicine

## 2021-08-10 ENCOUNTER — Encounter (HOSPITAL_COMMUNITY): Payer: Self-pay

## 2021-08-10 ENCOUNTER — Encounter: Payer: Self-pay | Admitting: Gastroenterology

## 2021-08-10 DIAGNOSIS — K625 Hemorrhage of anus and rectum: Secondary | ICD-10-CM | POA: Diagnosis not present

## 2021-08-10 DIAGNOSIS — I739 Peripheral vascular disease, unspecified: Secondary | ICD-10-CM | POA: Diagnosis not present

## 2021-08-10 DIAGNOSIS — E785 Hyperlipidemia, unspecified: Secondary | ICD-10-CM | POA: Diagnosis not present

## 2021-08-10 DIAGNOSIS — F0390 Unspecified dementia without behavioral disturbance: Secondary | ICD-10-CM | POA: Diagnosis not present

## 2021-08-10 DIAGNOSIS — Z79899 Other long term (current) drug therapy: Secondary | ICD-10-CM | POA: Diagnosis not present

## 2021-08-10 DIAGNOSIS — I10 Essential (primary) hypertension: Secondary | ICD-10-CM | POA: Diagnosis not present

## 2021-08-10 DIAGNOSIS — E559 Vitamin D deficiency, unspecified: Secondary | ICD-10-CM | POA: Diagnosis not present

## 2021-08-10 DIAGNOSIS — G459 Transient cerebral ischemic attack, unspecified: Secondary | ICD-10-CM | POA: Diagnosis not present

## 2021-08-10 DIAGNOSIS — R5381 Other malaise: Secondary | ICD-10-CM | POA: Diagnosis not present

## 2021-08-10 DIAGNOSIS — N3000 Acute cystitis without hematuria: Secondary | ICD-10-CM | POA: Diagnosis not present

## 2021-08-10 DIAGNOSIS — R7303 Prediabetes: Secondary | ICD-10-CM | POA: Diagnosis not present

## 2021-08-10 LAB — CBC WITH DIFFERENTIAL/PLATELET
Abs Immature Granulocytes: 0.01 10*3/uL (ref 0.00–0.07)
Basophils Absolute: 0.1 10*3/uL (ref 0.0–0.1)
Basophils Relative: 1 %
Eosinophils Absolute: 0.7 10*3/uL — ABNORMAL HIGH (ref 0.0–0.5)
Eosinophils Relative: 10 %
HCT: 48 % — ABNORMAL HIGH (ref 36.0–46.0)
Hemoglobin: 15.6 g/dL — ABNORMAL HIGH (ref 12.0–15.0)
Immature Granulocytes: 0 %
Lymphocytes Relative: 33 %
Lymphs Abs: 2.3 10*3/uL (ref 0.7–4.0)
MCH: 30.2 pg (ref 26.0–34.0)
MCHC: 32.5 g/dL (ref 30.0–36.0)
MCV: 92.8 fL (ref 80.0–100.0)
Monocytes Absolute: 0.5 10*3/uL (ref 0.1–1.0)
Monocytes Relative: 8 %
Neutro Abs: 3.4 10*3/uL (ref 1.7–7.7)
Neutrophils Relative %: 48 %
Platelets: 229 10*3/uL (ref 150–400)
RBC: 5.17 MIL/uL — ABNORMAL HIGH (ref 3.87–5.11)
RDW: 14 % (ref 11.5–15.5)
WBC: 6.9 10*3/uL (ref 4.0–10.5)
nRBC: 0 % (ref 0.0–0.2)

## 2021-08-10 MED ORDER — HYDRALAZINE HCL 25 MG PO TABS
25.0000 mg | ORAL_TABLET | Freq: Once | ORAL | Status: AC
  Administered 2021-08-10: 25 mg via ORAL
  Filled 2021-08-10: qty 1

## 2021-08-10 MED ORDER — HYDRALAZINE HCL 25 MG PO TABS: 50.0000 mg | ORAL_TABLET | Freq: Once | ORAL | Status: DC

## 2021-08-10 MED ORDER — HYDRALAZINE HCL 25 MG PO TABS: 25.0000 mg | ORAL_TABLET | Freq: Three times a day (TID) | ORAL | 0 refills | Status: DC

## 2021-08-10 MED ORDER — METOPROLOL TARTRATE 25 MG PO TABS
50.0000 mg | ORAL_TABLET | Freq: Once | ORAL | Status: DC
  Filled 2021-08-10: qty 2

## 2021-08-10 MED ORDER — CLONIDINE HCL 0.1 MG PO TABS
0.2000 mg | ORAL_TABLET | Freq: Once | ORAL | Status: AC
  Administered 2021-08-10: 0.2 mg via ORAL
  Filled 2021-08-10: qty 2

## 2021-08-10 NOTE — Discharge Instructions (Addendum)
Call your primary care doctor or specialist as discussed in the next 2-3 days.   Return immediately back to the ER if:  Your symptoms worsen within the next 12-24 hours. You develop new symptoms such as new fevers, persistent vomiting, new pain, shortness of breath, or new weakness or numbness, or if you have any other concerns.  

## 2021-08-10 NOTE — Progress Notes (Deleted)
NEUROLOGY FOLLOW UP OFFICE NOTE  Sheila Perez [id number]  Assessment/Plan:   1.  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  Involvement of deep gray matter, brainstem and cerebellum typically seen with hypertension.  There appears more abundant microhemorrhages in the cerebral white matter and cortex, which may be seen in cerebral amyloid angiopathy.  Also, blood pressure over the past year has been relatively controlled, so I cannot attribute this increase to blood pressure. 2.   Uncontrolled hypertension - blood pressure very elevated.  She reports that she has not been taking her medication.  Advised to patient and her daughter that she must continue to take her medication - if blood pressure remains elevated, must contact her PCP. 3.  Cerebrovascular disease 4.  Vascular-related mild neurocognitive disorder 5.  New onset left-sided headache over 5 years old - sed rate was normal but CRP was elevated - I would still like to evaluate for temporal arteritis  Subjective:  Sheila Perez is a 74 year old right-handed black female with HTN and prediabetes who follows up for cerebral amyloid angiopathy and left-sided headache  She is accompanied by her daughter who also supplements history.  UPDATE: ASA was discontinued last visit.  *** She was referred for left temporal artery biopsy but patient refused the biopsy.     HISTORY: She had an episode of difficulty forming words on 01/25/2020 for which she went to the ED.  CT and MRI of brain were negative for acute abnormality but did demonstrated significant chronic small vessel ischemic changes.  She was diagnosed with right-sided Bell's palsy and discharged on anti-viral and a prednisone taper.  Slurred speech has resolved gradually over the next week.  Advised to start ASA 81mg  daily.     Of  note, she reports 5 year history of weakness in both legs.  No back pain or pain in legs.  No lower extremity numbness.  She has fallen once.  She reportedly is ordered for vascular studies.  She takes atorvastatin which has only recently been started.  She has never before been on a statin.  Again, so trouble swallowing.  Denies double vision.  No bowel or bladder retention.  Her daughter states that she has a sedentary lifestyle.  Patellar reflexes were brisk.  MRI cervical spine was ordered but never performed.  CK was 86.   She was seen by her PCP in February 2022 for new onset left-sided headache with vomiting.  No prior history of headache.  Sed rate on 01/07/2021 was 38 with elevated CRP of 16.1. Repeat CRP on 01/28/2021 was 19.5.  CT head on 01/07/2021 personally reviewed showed 4 mm hyperdensity within the left temporal lobe.  Follow up MRI of brain with and without contrast on 01/12/2021 personally reviewed was limited due to motion.  Moderate to advanced chronic small vessel ischemic changes in cerebral white matter and pons noted as well as innumerable chronic microhemorrhages involving the the subcortical white matter, deep gray nuclei, brainstem and cerebellum suggestive of chronic hypertension, cerebral amyloid angiopathy or both.  She was advised to discontinue ASA, NSAIDs and fish oil.  Due to motion artifact, uncertain to identify finding on previous CT, so repeat CT head performed on 01/21/2021 personally reviewed showed unchanged punctate hyperdensity in left temporal lobe, thought to be calcification.  Headache resolved after 3 days .  She may have a  brief headache once in a while since then.  Denies memory deficits.  Blood pressure elevated today.  She reports that she has not been taking her blood pressure medication.   01/26/2020 CT HEAD WO:  1. No acute intracranial findings. 2. Periventricular white matter and corona radiata hypodensities favor chronic ischemic microvascular white matter  disease. 3. 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 4. Mildly irregular 1.7 by 1.9 cm lucent lesion along the hard palate, with possibilities including nasopalatine duct cyst, periapical granuloma, schwannoma, or malignancy such as adenoid cystic carcinoma. There is a note in the patient's surgical history of prior maxillofacial surgery, if this was in the vicinity of the maxilla/hard palate then correlation with that operation would be recommended. If the patient's prior surgery is not connected to this lesion, then maxillofacial MRI with and without contrast with attention to this hard palate mass would be recommended.  5. Chronic bilateral maxillary sinusitis.   01/26/2020 MRI BRAIN WO:  Brain: No acute infarct. Increased diffusion signal at the right basal ganglia is at the level of prior infarction and hemosiderin staining. Remote lacunar infarcts at the deep gray nuclei, brainstem, and right middle cerebellar peduncle. Confluent ischemic gliosis in the deep cerebral white matter. No acute hemorrhage.  Chronic blood products are seen along the bilateral posterior cerebral cortex, which may be posttraumatic. Remote micro hemorrhages at the basal ganglia and left cerebellum, likely from small vessel disease. Cerebral volume is overall normal; there is some atrophy of the brainstem which is likely post ischemic. No masslike finding or extra-axial collection.  Vascular: Preserved flow voids.  Skull and upper cervical spine: No evidence of bone lesion.  Sinuses/Orbits: No acute finding  PAST MEDICAL HISTORY: Past Medical History:  Diagnosis Date   Hyperlipidemia    Hypertension     MEDICATIONS: Current Outpatient Medications on File Prior to Visit  Medication Sig Dispense Refill   amLODipine (NORVASC) 10 MG tablet Take 1 tablet (10 mg total) by mouth daily. 90 tablet 1   Ascorbic Acid (VITAMIN C) 1000 MG tablet Take 500 mg by mouth daily.     atorvastatin (LIPITOR) 20 MG tablet TAKE  1 TABLET EVERY DAY 90 tablet 3   benazepril (LOTENSIN) 10 MG tablet Take 1 tablet (10 mg total) by mouth daily. 30 tablet 1   cephALEXin (KEFLEX) 500 MG capsule Take 1 capsule (500 mg total) by mouth 3 (three) times daily. 21 capsule 0   cholecalciferol (VITAMIN D3) 25 MCG (1000 UNIT) tablet Take 1,000 Units by mouth daily.     co-enzyme Q-10 30 MG capsule Take 30 mg by mouth daily.     No current facility-administered medications on file prior to visit.    ALLERGIES: No Known Allergies  FAMILY HISTORY: Family History  Problem Relation Age of Onset   Kidney disease Mother    Diabetes Mother    Alcohol abuse Father    Diabetes Sister    Diabetes Daughter    Diabetes Sister       Objective:  *** General: No acute distress.  Patient appears ***-groomed.   Head:  Normocephalic/atraumatic Eyes:  Fundi examined but not visualized Neck: supple, no paraspinal tenderness, full range of motion Heart:  Regular rate and rhythm Lungs:  Clear to auscultation bilaterally Back: No paraspinal tenderness Neurological Exam: alert and oriented to person, place, and time.  Speech fluent and not dysarthric, language intact.  CN II-XII intact. Bulk and tone normal, muscle strength 5/5 throughout.  Sensation to light  touch intact.  Deep tendon reflexes 2+ throughout, toes downgoing.  Finger to nose testing intact.  Gait normal, Romberg negative.   Metta Clines, DO  CC: ***

## 2021-08-10 NOTE — ED Triage Notes (Signed)
Patient arrived [facility] from home. Lives with daughter? Daughter will come to ED shortly.   Called out for blood pressure problems. Pt took her medication about 10am this morning- Norvasc.   210/140 P-78 98% RA RR-18    Hx: stroke and takes speech therapy now   A/Ox4 Unsteady on feet needs 1 assist at least.   Full Code

## 2021-08-10 NOTE — ED Provider Notes (Addendum)
[facility] DEPT Provider Note   CSN: [id number] Arrival date & time: 08/10/21  1127     History Chief Complaint  Patient presents with   Hypertension    [id number] Sheila Perez is a 75 y.o. female.  Presents from home with chief complaint of high blood pressure.  She states that her blood pressure was checked this morning and she noticed it was high over 200.  She took her blood pressure medications without any improvement and presents to the ER.  Otherwise denies any symptoms no headache no chest pain abdominal pain.  No recent fevers or cough or vomiting or diarrhea.  Patient has a history of stroke in the past is undergoing speech therapy and states that she walks with a walker at baseline.      Past Medical History:  Diagnosis Date   Hyperlipidemia    Hypertension     Patient Active Problem List   Diagnosis Date Noted   Vitamin D deficiency, unspecified 04/18/2021   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CODE) 04/18/2021   Morbid obesity ([place]) 05/19/2020   PAD (peripheral artery disease) ([place]) 05/07/2020   Urinary incontinence in female 02/27/2020   Hyperlipidemia 02/18/2020   Prediabetes 02/18/2020   CVA (cerebral vascular accident) ([place]) 02/18/2020   Hypertension, essential, benign 01/30/2020    Past Surgical History:  Procedure Laterality Date   ABDOMINAL HYSTERECTOMY     maxilo facial surgery       OB History   No obstetric history on file.     Family History  Problem Relation Age of Onset   Kidney disease Mother    Diabetes Mother    Alcohol abuse Father    Diabetes Sister    Diabetes Daughter    Diabetes Sister     Social History   Tobacco Use   Smoking status: Never   Smokeless tobacco: Never  Vaping Use   Vaping Use: Never used  Substance Use Topics   Alcohol use: No   Drug use: No    Home Medications Prior to Admission medications   Medication Sig Start Date End Date Taking? Authorizing Provider  amLODipine  (NORVASC) 10 MG tablet Take 1 tablet (10 mg total) by mouth daily. 07/22/21  Yes [name], [name], MD  atorvastatin (LIPITOR) 20 MG tablet TAKE 1 TABLET EVERY DAY Patient taking differently: Take 20 mg by mouth daily. 07/22/21  Yes [name], [name], MD  cholecalciferol (VITAMIN D3) 25 MCG (1000 UNIT) tablet Take 1,000 Units by mouth daily.   Yes [provider]  co-enzyme Q-10 30 MG capsule Take 30 mg by mouth daily.   Yes [provider]  hydrALAZINE (APRESOLINE) 25 MG tablet Take 1 tablet (25 mg total) by mouth 3 (three) times daily for 15 days. 08/10/21 08/25/21 Yes Luna Fuse, MD  benazepril (LOTENSIN) 10 MG tablet Take 1 tablet (10 mg total) by mouth daily. Patient not taking: Reported on 08/10/2021 02/27/20   [name], [name], MD  cephALEXin (KEFLEX) 500 MG capsule Take 1 capsule (500 mg total) by mouth 3 (three) times daily. Patient not taking: Reported on 08/10/2021 07/21/21   Petrucelli, Glynda Jaeger, PA-C    Allergies    Patient has no known allergies.  Review of Systems   Review of Systems  Constitutional:  Negative for fever.  HENT:  Negative for ear pain.   Eyes:  Negative for pain.  Respiratory:  Negative for cough.   Cardiovascular:  Negative for chest pain.  Gastrointestinal:  Negative for  abdominal pain.  Genitourinary:  Negative for flank pain.  Musculoskeletal:  Negative for back pain.  Skin:  Negative for rash.  Neurological:  Negative for headaches.   Physical Exam Updated Vital Signs BP (!) 160/79   Pulse 72   Temp 98.7 F (37.1 C) (Oral)   Resp 20   SpO2 100%   Physical Exam Constitutional:      General: She is not in acute distress.    Appearance: Normal appearance.  HENT:     Head: Normocephalic.     Nose: Nose normal.  Eyes:     Extraocular Movements: Extraocular movements intact.  Cardiovascular:     Rate and Rhythm: Normal rate.  Pulmonary:     Effort: Pulmonary effort is normal.  Musculoskeletal:        General: Normal range of  motion.     Cervical back: Normal range of motion.  Neurological:     General: No focal deficit present.     Mental Status: She is alert and oriented to person, place, and time. Mental status is at baseline.     Comments: Bilateral upper and lower extremities are 5/5 strength.  Patient denies any new weakness.  Speech patterns mild to moderately slurred, patient states this is her baseline    ED Results / Procedures / Treatments   Labs (all labs ordered are listed, but only abnormal results are displayed) Labs Reviewed  CBC WITH DIFFERENTIAL/PLATELET - Abnormal; Notable for the following components:      Result Value   RBC 5.17 (*)    Hemoglobin 15.6 (*)    HCT 48.0 (*)    Eosinophils Absolute 0.7 (*)    All other components within normal limits  BASIC METABOLIC PANEL    EKG None  Radiology No results found.  Procedures Procedures   Medications Ordered in ED Medications  metoprolol tartrate (LOPRESSOR) tablet 50 mg (has no administration in time range)  hydrALAZINE (APRESOLINE) tablet 25 mg (25 mg Oral Given 08/10/21 1322)  cloNIDine (CATAPRES) tablet 0.2 mg (0.2 mg Oral Given 08/10/21 1445)    ED Course  I have reviewed the triage vital signs and the nursing notes.  Pertinent labs & imaging results that were available during my care of the patient were reviewed by me and considered in my medical decision making (see chart for details).    MDM Rules/Calculators/A&P                           Blood pressure severely elevated here in the ER.  Given multiple blood pressure medications.  Blood pressure appears improved.  CBC is within normal limits.  BMP was hemolyzed.  Family declined to stay for repeat testing patient states she has no other complaints other than blood pressure being high.  Will be discharged home to follow-up with her doctor within the week.  Advised immediate return for worsening symptoms or any additional concerns.    Final Clinical Impression(s)  / ED Diagnoses Final diagnoses:  Hypertension, unspecified type    Rx / DC Orders ED Discharge Orders          Ordered    hydrALAZINE (APRESOLINE) 25 MG tablet  3 times daily        08/10/21 1648             Luna Fuse, MD 08/10/21 1643    Luna Fuse, MD 08/10/21 [phone]

## 2021-08-10 NOTE — ED Provider Notes (Signed)
Emergency Medicine Provider Triage Evaluation Note  [phone] Rafael Quesada , a 75 y.o. female  was evaluated in triage.  Pt complains of HTN. Denies chest pain or headache.  Review of Systems  Positive: htn Negative: Chest pain, headache  Physical Exam  BP (!) 168/125 (BP Location: Right Arm)   Pulse 73   Temp 98.7 F (37.1 C) (Oral)   Resp 18   SpO2 100%  Gen:   Awake, no distress   Resp:  Normal effort  MSK:   Moves extremities without difficulty  Other:  Clear speech, no facial droop  Medical Decision Making  Medically screening exam initiated at 12:07 PM.  Appropriate orders placed.  Elysian was informed that the remainder of the evaluation will be completed by another provider, this initial triage assessment does not replace that evaluation, and the importance of remaining in the ED until their evaluation is complete.     Bishop Dublin 08/10/21 1208    Luna Fuse, MD 08/10/21 [phone]

## 2021-08-11 LAB — BASIC METABOLIC PANEL
Anion gap: 10 (ref 5–15)
BUN: 18 mg/dL (ref 8–23)
CO2: 25 mmol/L (ref 22–32)
Calcium: 8.6 mg/dL — ABNORMAL LOW (ref 8.9–10.3)
Chloride: 102 mmol/L (ref 98–111)
Creatinine, Ser: 0.94 mg/dL (ref 0.44–1.00)
GFR, Estimated: >60 mL/min (ref >60)
Glucose, Bld: 166 mg/dL — ABNORMAL HIGH (ref 70–99)
Potassium: 3.9 mmol/L (ref 3.5–5.1)
Sodium: 137 mmol/L (ref 135–145)

## 2021-08-12 ENCOUNTER — Telehealth (INDEPENDENT_AMBULATORY_CARE_PROVIDER_SITE_OTHER): Payer: Medicare HMO | Admitting: Neurology

## 2021-08-12 ENCOUNTER — Other Ambulatory Visit: Payer: Self-pay

## 2021-08-12 ENCOUNTER — Encounter: Payer: Self-pay | Admitting: Neurology

## 2021-08-12 DIAGNOSIS — I10 Essential (primary) hypertension: Secondary | ICD-10-CM

## 2021-08-12 DIAGNOSIS — R29898 Other symptoms and signs involving the musculoskeletal system: Secondary | ICD-10-CM | POA: Diagnosis not present

## 2021-08-12 DIAGNOSIS — F0150 Vascular dementia without behavioral disturbance: Secondary | ICD-10-CM

## 2021-08-12 DIAGNOSIS — I680 Cerebral amyloid angiopathy: Secondary | ICD-10-CM | POA: Diagnosis not present

## 2021-08-12 DIAGNOSIS — E785 Hyperlipidemia, unspecified: Secondary | ICD-10-CM | POA: Diagnosis not present

## 2021-08-12 DIAGNOSIS — F01A0 Vascular dementia, mild, without behavioral disturbance, psychotic disturbance, mood disturbance, and anxiety: Secondary | ICD-10-CM

## 2021-08-12 NOTE — Patient Instructions (Signed)
Check CK Nerve study of legs Follow up in office

## 2021-08-12 NOTE — Progress Notes (Signed)
Virtual Visit via Video Note The purpose of this virtual visit is to provide medical care while limiting exposure to the novel coronavirus.     Consent was obtained for video visit:  Yes.   Answered questions that patient had about telehealth interaction:  Yes.   I discussed the limitations, risks, security and privacy concerns of performing an evaluation and management service by telemedicine. I also discussed with the patient that there may be a patient responsible charge related to this service. The patient expressed understanding and agreed to proceed.   Pt location: Home Physician Location: office Name of referring provider:  [name], [name], MD I connected with Roel Cluck at patients initiation/request on 08/12/2021 at  8:50 AM EDT by video enabled telemedicine application and verified that I am speaking with the correct person using two identifiers. Pt MRN:  [id number] Pt DOB:  09-03-1946 Video Participants:  VFIEPPIR Rivka Spring;  Daughter   Assessment and Plan:   1.  Lower extremity weakness - unclear etiology as I have not been able to evaluate her.  However, she has longstanding history of intermittent leg weakness.  She reportedly was sedentary the entire 2 months away and she came home with a UTI.  This may be deconditioning, but I will need to really evaluate her. 2.  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  Involvement of deep gray matter, brainstem and cerebellum typically seen with hypertension.  There appears more abundant microhemorrhages in the cerebral white matter and cortex, which may be seen in cerebral amyloid angiopathy.  Also, blood pressure over the past year has been relatively controlled, so I cannot attribute this increase to blood pressure. 3.  Cerebrovascular disease 4.  Vascular-related mild neurocognitive  disorder 5.  New onset left-sided headache over 75 years old - sed rate was normal but CRP was elevated -Patient and daughter declined temporal artery biopsy.  However, repeat CRP normal and headache resolved.   Will check CK followed by NCV-EMG of lower extremities Follow up after testing for formal exam and next steps Continue physical therapy   History of Present Illness:  Sheila Perez is a 75 year old right-handed black female with HTN and prediabetes who follows up for cerebral amyloid angiopathy and left-sided headache  She is accompanied by her daughter who also supplements history.   UPDATE: ASA was discontinued last visit.  She was referred for left temporal artery biopsy but patient refused the biopsy.  However, headaches reportedly improved.  Repeat CRP in June was 1.5.  In June, she went out of the country to [place] in June for 2 months.  She was sedentary during her time there and only went outside once.  When she came back she had a UTI.  Blood pressure has been elevated.  Since coming back, she has had difficulty moving her legs.  Legs start to shake when she is walking and trying to initiate steps.  She is now unable to stand unassisted and now relies on walker.  No numbness or pain in the legs or back, however she has transient back pain upon rising after prolonged sitting.  She chokes on food when eating.  No double vision.  Denies tremors in hands.  No facial droop or slurred speech.     HISTORY: She had an episode of difficulty forming words on 01/25/2020 for which she went to  the ED.  CT and MRI of brain were negative for acute abnormality but did demonstrated significant chronic small vessel ischemic changes.  She was diagnosed with right-sided Bell's palsy and discharged on anti-viral and a prednisone taper.  Slurred speech has resolved gradually over the next week.  Advised to start ASA 81mg  daily.     Of note, she reports 5 year history of weakness in both legs.  No  back pain or pain in legs.  No lower extremity numbness.  She has fallen once.  She reportedly is ordered for vascular studies.  She takes atorvastatin which has only recently been started.  She has never before been on a statin.  Again, no trouble swallowing.  Denies double vision.  No bowel or bladder retention.  Her daughter states that she has a sedentary lifestyle.  Patellar reflexes were brisk.  MRI cervical spine was ordered but never performed.  CK was 86.   She was seen by her PCP in February 2022 for new onset left-sided headache with vomiting.  No prior history of headache.  Sed rate on 01/07/2021 was 38 with elevated CRP of 16.1. Repeat CRP on 01/28/2021 was 19.5.  CT head on 01/07/2021 personally reviewed showed 4 mm hyperdensity within the left temporal lobe.  Follow up MRI of brain with and without contrast on 01/12/2021 personally reviewed was limited due to motion.  Moderate to advanced chronic small vessel ischemic changes in cerebral white matter and pons noted as well as innumerable chronic microhemorrhages involving the the subcortical white matter, deep gray nuclei, brainstem and cerebellum suggestive of chronic hypertension, cerebral amyloid angiopathy or both.  She was advised to discontinue ASA, NSAIDs and fish oil.  Due to motion artifact, uncertain to identify finding on previous CT, so repeat CT head performed on 01/21/2021 personally reviewed showed unchanged punctate hyperdensity in left temporal lobe, thought to be calcification.  Headache resolved after 3 days .  She may have a brief headache once in a while since then.  Denies memory deficits.  Blood pressure elevated today.  She reports that she has not been taking her blood pressure medication.   01/26/2020 CT HEAD WO:  1. No acute intracranial findings. 2. Periventricular white matter and corona radiata hypodensities favor chronic ischemic microvascular white matter disease. 3. Small chronic lacunar infarcts in the basal  ganglia. 4. Mildly irregular 1.7 by 1.9 cm lucent lesion along the hard palate, with possibilities including nasopalatine duct cyst, periapical granuloma, schwannoma, or malignancy such as adenoid cystic carcinoma. There is a note in the patient's surgical history of prior maxillofacial surgery, if this was in the vicinity of the maxilla/hard palate then correlation with that operation would be recommended. If the patient's prior surgery is not connected to this lesion, then maxillofacial MRI with and without contrast with attention to this hard palate mass would be recommended.  5. Chronic bilateral maxillary sinusitis.   01/26/2020 MRI BRAIN WO:  Brain: No acute infarct. Increased diffusion signal at the right basal ganglia is at the level of prior infarction and hemosiderin staining. Remote lacunar infarcts at the deep gray nuclei, brainstem, and right middle cerebellar peduncle. Confluent ischemic gliosis in the deep cerebral white matter. No acute hemorrhage.  Chronic blood products are seen along the bilateral posterior cerebral cortex, which may be posttraumatic. Remote micro hemorrhages at the basal ganglia and left cerebellum, likely from small vessel disease. Cerebral volume is overall normal; there is some atrophy of the brainstem which is likely post ischemic.  No masslike finding or extra-axial collection.  Vascular: Preserved flow voids.  Skull and upper cervical spine: No evidence of bone lesion.  Sinuses/Orbits: No acute finding   Past Medical History:     Past Medical History:  Diagnosis Date   Hyperlipidemia     Hypertension        Medications:     Outpatient Encounter Medications as of 08/12/2021  Medication Sig   amLODipine (NORVASC) 10 MG tablet Take 1 tablet (10 mg total) by mouth daily.   atorvastatin (LIPITOR) 20 MG tablet TAKE 1 TABLET EVERY DAY (Patient taking differently: Take 20 mg by mouth daily.)   cholecalciferol (VITAMIN D3) 25 MCG (1000 UNIT) tablet Take 1,000  Units by mouth daily.   co-enzyme Q-10 30 MG capsule Take 30 mg by mouth daily.   hydrALAZINE (APRESOLINE) 25 MG tablet Take 1 tablet (25 mg total) by mouth 3 (three) times daily for 15 days.   benazepril (LOTENSIN) 10 MG tablet Take 1 tablet (10 mg total) by mouth daily. (Patient not taking: No sig reported)   cephALEXin (KEFLEX) 500 MG capsule Take 1 capsule (500 mg total) by mouth 3 (three) times daily. (Patient not taking: No sig reported)   [DISCONTINUED] Ascorbic Acid (VITAMIN C) 1000 MG tablet Take 500 mg by mouth daily.    No facility-administered encounter medications on file as of 08/12/2021.      Allergies: No Known Allergies   Family History:      Family History  Problem Relation Age of Onset   Kidney disease Mother     Diabetes Mother     Alcohol abuse Father     Diabetes Sister     Diabetes Daughter     Diabetes Sister        Observations/Objective:   There were no vitals taken for this visit. Watched patient stand up from couch requiring assistance.  Holding on the walker, patient takes short strides.     Follow Up Instructions:               -I discussed the assessment and treatment plan with the patient. The patient was provided an opportunity to ask questions and all were answered. The patient agreed with the plan and demonstrated an understanding of the instructions.   The patient was advised to call back or seek an in-person evaluation if the symptoms worsen or if the condition fails to improve as anticipated.     Dudley Major, DO

## 2021-08-15 DIAGNOSIS — E559 Vitamin D deficiency, unspecified: Secondary | ICD-10-CM | POA: Diagnosis not present

## 2021-08-15 DIAGNOSIS — I10 Essential (primary) hypertension: Secondary | ICD-10-CM | POA: Diagnosis not present

## 2021-08-15 DIAGNOSIS — K625 Hemorrhage of anus and rectum: Secondary | ICD-10-CM | POA: Diagnosis not present

## 2021-08-15 DIAGNOSIS — I739 Peripheral vascular disease, unspecified: Secondary | ICD-10-CM | POA: Diagnosis not present

## 2021-08-15 DIAGNOSIS — R7303 Prediabetes: Secondary | ICD-10-CM | POA: Diagnosis not present

## 2021-08-15 DIAGNOSIS — N3000 Acute cystitis without hematuria: Secondary | ICD-10-CM | POA: Diagnosis not present

## 2021-08-15 DIAGNOSIS — F0390 Unspecified dementia without behavioral disturbance: Secondary | ICD-10-CM | POA: Diagnosis not present

## 2021-08-15 DIAGNOSIS — E785 Hyperlipidemia, unspecified: Secondary | ICD-10-CM | POA: Diagnosis not present

## 2021-08-16 ENCOUNTER — Telehealth: Payer: Self-pay

## 2021-08-16 DIAGNOSIS — I10 Essential (primary) hypertension: Secondary | ICD-10-CM | POA: Diagnosis not present

## 2021-08-16 DIAGNOSIS — F0390 Unspecified dementia without behavioral disturbance: Secondary | ICD-10-CM | POA: Diagnosis not present

## 2021-08-16 DIAGNOSIS — E559 Vitamin D deficiency, unspecified: Secondary | ICD-10-CM | POA: Diagnosis not present

## 2021-08-16 DIAGNOSIS — E785 Hyperlipidemia, unspecified: Secondary | ICD-10-CM | POA: Diagnosis not present

## 2021-08-16 DIAGNOSIS — N3000 Acute cystitis without hematuria: Secondary | ICD-10-CM | POA: Diagnosis not present

## 2021-08-16 DIAGNOSIS — R7303 Prediabetes: Secondary | ICD-10-CM | POA: Diagnosis not present

## 2021-08-16 DIAGNOSIS — I739 Peripheral vascular disease, unspecified: Secondary | ICD-10-CM | POA: Diagnosis not present

## 2021-08-16 DIAGNOSIS — K625 Hemorrhage of anus and rectum: Secondary | ICD-10-CM | POA: Diagnosis not present

## 2021-08-16 NOTE — Telephone Encounter (Signed)
[facility] called in for verbal order for speech therapy. Verbal given.

## 2021-08-17 ENCOUNTER — Ambulatory Visit: Payer: Medicare HMO

## 2021-08-17 ENCOUNTER — Ambulatory Visit: Payer: Medicare HMO | Admitting: Family Medicine

## 2021-08-18 DIAGNOSIS — N3000 Acute cystitis without hematuria: Secondary | ICD-10-CM | POA: Diagnosis not present

## 2021-08-18 DIAGNOSIS — K625 Hemorrhage of anus and rectum: Secondary | ICD-10-CM | POA: Diagnosis not present

## 2021-08-18 DIAGNOSIS — I739 Peripheral vascular disease, unspecified: Secondary | ICD-10-CM | POA: Diagnosis not present

## 2021-08-18 DIAGNOSIS — F0390 Unspecified dementia without behavioral disturbance: Secondary | ICD-10-CM | POA: Diagnosis not present

## 2021-08-18 DIAGNOSIS — R7303 Prediabetes: Secondary | ICD-10-CM | POA: Diagnosis not present

## 2021-08-18 DIAGNOSIS — E559 Vitamin D deficiency, unspecified: Secondary | ICD-10-CM | POA: Diagnosis not present

## 2021-08-18 DIAGNOSIS — I10 Essential (primary) hypertension: Secondary | ICD-10-CM | POA: Diagnosis not present

## 2021-08-18 DIAGNOSIS — E785 Hyperlipidemia, unspecified: Secondary | ICD-10-CM | POA: Diagnosis not present

## 2021-08-23 ENCOUNTER — Ambulatory Visit: Payer: Medicare HMO | Admitting: Family Medicine

## 2021-08-24 ENCOUNTER — Telehealth: Payer: Self-pay

## 2021-08-24 ENCOUNTER — Other Ambulatory Visit: Payer: Self-pay | Admitting: Family Medicine

## 2021-08-24 DIAGNOSIS — I10 Essential (primary) hypertension: Secondary | ICD-10-CM | POA: Diagnosis not present

## 2021-08-24 DIAGNOSIS — E559 Vitamin D deficiency, unspecified: Secondary | ICD-10-CM | POA: Diagnosis not present

## 2021-08-24 DIAGNOSIS — N3000 Acute cystitis without hematuria: Secondary | ICD-10-CM | POA: Diagnosis not present

## 2021-08-24 DIAGNOSIS — K625 Hemorrhage of anus and rectum: Secondary | ICD-10-CM | POA: Diagnosis not present

## 2021-08-24 DIAGNOSIS — E785 Hyperlipidemia, unspecified: Secondary | ICD-10-CM | POA: Diagnosis not present

## 2021-08-24 DIAGNOSIS — I739 Peripheral vascular disease, unspecified: Secondary | ICD-10-CM | POA: Diagnosis not present

## 2021-08-24 DIAGNOSIS — R7303 Prediabetes: Secondary | ICD-10-CM | POA: Diagnosis not present

## 2021-08-24 DIAGNOSIS — F0390 Unspecified dementia without behavioral disturbance: Secondary | ICD-10-CM | POA: Diagnosis not present

## 2021-08-24 MED ORDER — HYDRALAZINE HCL 25 MG PO TABS
25.0000 mg | ORAL_TABLET | Freq: Three times a day (TID) | ORAL | 1 refills | Status: DC
Start: 2021-08-24 — End: 2021-08-24

## 2021-08-24 MED ORDER — BENAZEPRIL HCL 10 MG PO TABS
10.0000 mg | ORAL_TABLET | Freq: Every day | ORAL | 1 refills | Status: DC
Start: 2021-08-24 — End: 2021-08-24

## 2021-08-24 NOTE — Addendum Note (Signed)
Addended by: Rodrigo Ran on: 08/24/2021 02:25 PM   Modules accepted: Orders

## 2021-08-24 NOTE — Telephone Encounter (Signed)
Patient's daughter returned my call. Patient has not been taking the benazepril. I spoke with Dr. [name], she wants patient to continue her Amlodpine, the Hydralazine and restart the benazepril. She would like for the daughter to bring a log of her blood pressures with them on Monday. Patient's daughter verbalized understanding. She was also wanting to know if a possible artery scan could be ordered to figure out why her blood pressure has risen all of a sudden. I advised her this can be discussed during the visit.

## 2021-08-24 NOTE — Telephone Encounter (Signed)
She is currently on benazepril 10 mg daily, if BP is persistently elevated she can increase dose to 20 mg (2 tablets daily).  No changes in Amlodipine. Bring BP readings next visit. Thanks, BJ

## 2021-08-24 NOTE — Telephone Encounter (Signed)
Lauren from [facility] called to inform patient's B/P reading is 184/94 and after recheck read 177/87 and would like to know if there is any medication that can be given. I informed Lauren pt had O[facility] scheduled on 10/11 for B/P check and no showed. Daughter of pt came to phone and was informed of missed appt and daughter rescheduled 10/17

## 2021-08-24 NOTE — Progress Notes (Signed)
error 

## 2021-08-24 NOTE — Telephone Encounter (Signed)
I left patient's daughter a voicemail to return my call.

## 2021-08-25 ENCOUNTER — Telehealth: Payer: Self-pay | Admitting: Family Medicine

## 2021-08-25 DIAGNOSIS — N3000 Acute cystitis without hematuria: Secondary | ICD-10-CM | POA: Diagnosis not present

## 2021-08-25 DIAGNOSIS — I739 Peripheral vascular disease, unspecified: Secondary | ICD-10-CM | POA: Diagnosis not present

## 2021-08-25 DIAGNOSIS — I10 Essential (primary) hypertension: Secondary | ICD-10-CM | POA: Diagnosis not present

## 2021-08-25 DIAGNOSIS — R7303 Prediabetes: Secondary | ICD-10-CM | POA: Diagnosis not present

## 2021-08-25 DIAGNOSIS — K625 Hemorrhage of anus and rectum: Secondary | ICD-10-CM | POA: Diagnosis not present

## 2021-08-25 DIAGNOSIS — E559 Vitamin D deficiency, unspecified: Secondary | ICD-10-CM | POA: Diagnosis not present

## 2021-08-25 DIAGNOSIS — E785 Hyperlipidemia, unspecified: Secondary | ICD-10-CM | POA: Diagnosis not present

## 2021-08-25 DIAGNOSIS — F0390 Unspecified dementia without behavioral disturbance: Secondary | ICD-10-CM | POA: Diagnosis not present

## 2021-08-25 NOTE — Telephone Encounter (Signed)
Sheila Perez from [facility] call and stated pt BP was 154/94 and pt have a appt on Monday .Sheila Perez's phone  # is [phone].

## 2021-08-26 DIAGNOSIS — I10 Essential (primary) hypertension: Secondary | ICD-10-CM | POA: Diagnosis not present

## 2021-08-26 DIAGNOSIS — E559 Vitamin D deficiency, unspecified: Secondary | ICD-10-CM | POA: Diagnosis not present

## 2021-08-26 DIAGNOSIS — I739 Peripheral vascular disease, unspecified: Secondary | ICD-10-CM | POA: Diagnosis not present

## 2021-08-26 DIAGNOSIS — N3000 Acute cystitis without hematuria: Secondary | ICD-10-CM | POA: Diagnosis not present

## 2021-08-26 DIAGNOSIS — K625 Hemorrhage of anus and rectum: Secondary | ICD-10-CM | POA: Diagnosis not present

## 2021-08-26 DIAGNOSIS — E785 Hyperlipidemia, unspecified: Secondary | ICD-10-CM | POA: Diagnosis not present

## 2021-08-26 DIAGNOSIS — F0390 Unspecified dementia without behavioral disturbance: Secondary | ICD-10-CM | POA: Diagnosis not present

## 2021-08-26 DIAGNOSIS — R7303 Prediabetes: Secondary | ICD-10-CM | POA: Diagnosis not present

## 2021-08-26 NOTE — Telephone Encounter (Signed)
We will wait until Monday before changing antihypertensive meds. Bring BP readings. Thanks, BJ

## 2021-08-27 ENCOUNTER — Encounter (HOSPITAL_COMMUNITY): Payer: Self-pay

## 2021-08-27 ENCOUNTER — Emergency Department (HOSPITAL_COMMUNITY): Payer: Medicare HMO

## 2021-08-27 ENCOUNTER — Emergency Department (HOSPITAL_COMMUNITY)
Admission: EM | Admit: 2021-08-27 | Discharge: 2021-08-27 | Disposition: A | Discharge status: Home / Self Care | Payer: Medicare HMO | Attending: Emergency Medicine | Admitting: Emergency Medicine

## 2021-08-27 ENCOUNTER — Other Ambulatory Visit: Payer: Self-pay

## 2021-08-27 DIAGNOSIS — H538 Other visual disturbances: Secondary | ICD-10-CM | POA: Insufficient documentation

## 2021-08-27 DIAGNOSIS — Z79899 Other long term (current) drug therapy: Secondary | ICD-10-CM | POA: Diagnosis not present

## 2021-08-27 DIAGNOSIS — R42 Dizziness and giddiness: Secondary | ICD-10-CM | POA: Diagnosis not present

## 2021-08-27 DIAGNOSIS — I10 Essential (primary) hypertension: Secondary | ICD-10-CM

## 2021-08-27 DIAGNOSIS — G319 Degenerative disease of nervous system, unspecified: Secondary | ICD-10-CM | POA: Diagnosis not present

## 2021-08-27 DIAGNOSIS — H532 Diplopia: Secondary | ICD-10-CM | POA: Diagnosis not present

## 2021-08-27 LAB — CBC WITH DIFFERENTIAL/PLATELET
Abs Immature Granulocytes: 0.02 10*3/uL (ref 0.00–0.07)
Basophils Absolute: 0.1 10*3/uL (ref 0.0–0.1)
Basophils Relative: 1 %
Eosinophils Absolute: 0.4 10*3/uL (ref 0.0–0.5)
Eosinophils Relative: 5 %
HCT: 40.9 % (ref 36.0–46.0)
Hemoglobin: 13.2 g/dL (ref 12.0–15.0)
Immature Granulocytes: 0 %
Lymphocytes Relative: 24 %
Lymphs Abs: 1.7 10*3/uL (ref 0.7–4.0)
MCH: 30.3 pg (ref 26.0–34.0)
MCHC: 32.3 g/dL (ref 30.0–36.0)
MCV: 93.8 fL (ref 80.0–100.0)
Monocytes Absolute: 0.5 10*3/uL (ref 0.1–1.0)
Monocytes Relative: 7 %
Neutro Abs: 4.4 10*3/uL (ref 1.7–7.7)
Neutrophils Relative %: 63 %
Platelets: 251 10*3/uL (ref 150–400)
RBC: 4.36 MIL/uL (ref 3.87–5.11)
RDW: 13.8 % (ref 11.5–15.5)
WBC: 7.1 10*3/uL (ref 4.0–10.5)
nRBC: 0 % (ref 0.0–0.2)

## 2021-08-27 LAB — COMPREHENSIVE METABOLIC PANEL
ALT: 12 U/L (ref 0–44)
AST: 15 U/L (ref 15–41)
Albumin: 3.2 g/dL — ABNORMAL LOW (ref 3.5–5.0)
Alkaline Phosphatase: 54 U/L (ref 38–126)
Anion gap: 8 (ref 5–15)
BUN: 16 mg/dL (ref 8–23)
CO2: 24 mmol/L (ref 22–32)
Calcium: 8.7 mg/dL — ABNORMAL LOW (ref 8.9–10.3)
Chloride: 105 mmol/L (ref 98–111)
Creatinine, Ser: 0.93 mg/dL (ref 0.44–1.00)
GFR, Estimated: >60 mL/min (ref >60)
Glucose, Bld: 94 mg/dL (ref 70–99)
Potassium: 4 mmol/L (ref 3.5–5.1)
Sodium: 137 mmol/L (ref 135–145)
Total Bilirubin: 0.7 mg/dL (ref 0.3–1.2)
Total Protein: 6.6 g/dL (ref 6.5–8.1)

## 2021-08-27 LAB — TROPONIN I (HIGH SENSITIVITY): Troponin I (High Sensitivity): 5 ng/L (ref <18)

## 2021-08-27 LAB — URINALYSIS, ROUTINE W REFLEX MICROSCOPIC
Bacteria, UA: NONE SEEN
Bilirubin Urine: NEGATIVE
Glucose, UA: NEGATIVE mg/dL
Hgb urine dipstick: NEGATIVE
Ketones, ur: 5 mg/dL — AB
Leukocytes,Ua: NEGATIVE
Nitrite: NEGATIVE
Protein, ur: 30 mg/dL — AB
Specific Gravity, Urine: 1.01 (ref 1.005–1.030)
pH: 7 (ref 5.0–8.0)

## 2021-08-27 IMAGING — MR MR HEAD W/O CM
6 of 10 series · 29 of 48 positions shown · non-contrast
Comparison: Same day head CT.  MRI head [DATE].

CLINICAL DATA: Diplopia

EXAM:
MRI HEAD WITHOUT CONTRAST
TECHNIQUE: Multiplanar, multiecho pulse sequences of the brain and surrounding
structures were obtained without intravenous contrast.

[Series 2: DWI · axial · 3.0mm · 0.94mm/px · z∈[-47,+94]mm · 9 of 100 slices shown (1 of 2)]
[im 1/100]
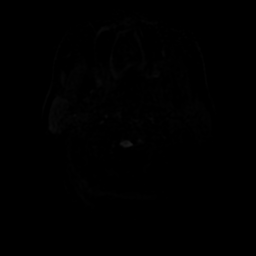
[im 13/100]
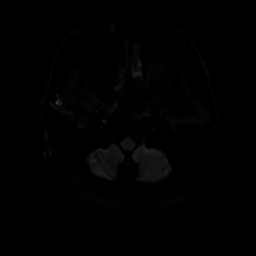
[im 25/100]
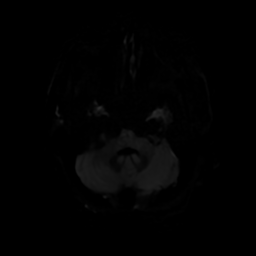
[im 38/100]
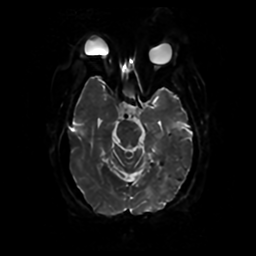
[im 50/100]
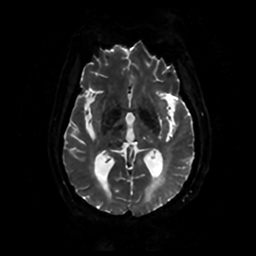
[im 62/100]
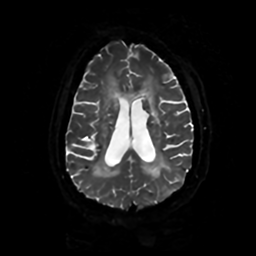
[im 75/100]
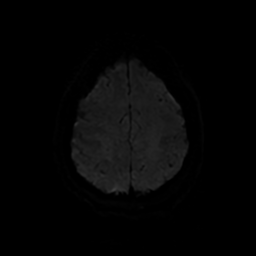
[im 87/100]
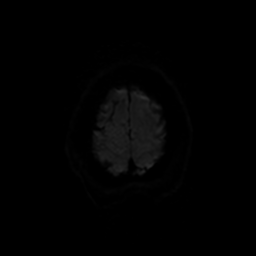
[im 100/100]
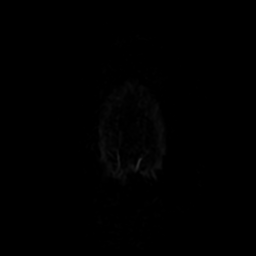

[Series 3: DWI · coronal · 4.0mm · 0.94mm/px · 7 of 74 slices shown (2 of 2)]
[im 1/74]
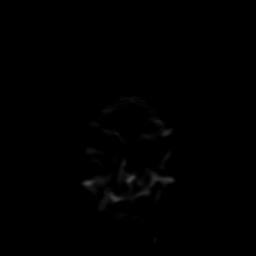
[im 13/74]
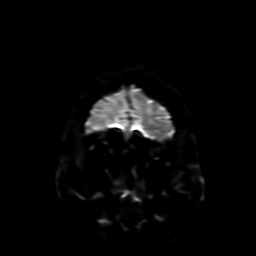
[im 25/74]
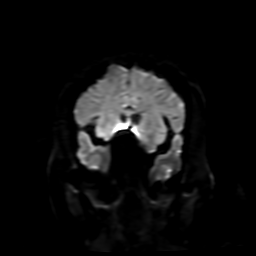
[im 37/74]
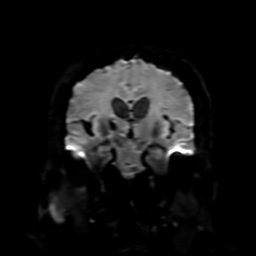
[im 49/74]
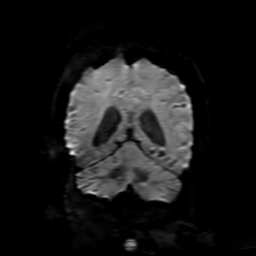
[im 61/74]
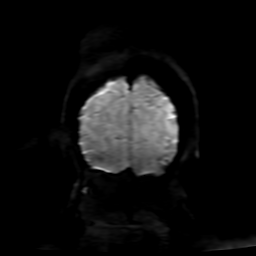
[im 74/74]
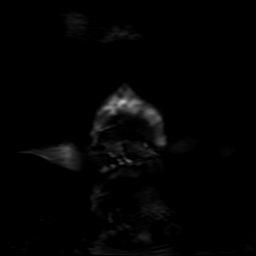

[Series 4: FLAIR · sagittal · 5.0mm · 0.23mm/px · 2 of 23 slices shown (1 of 2)]
[im 1/23]
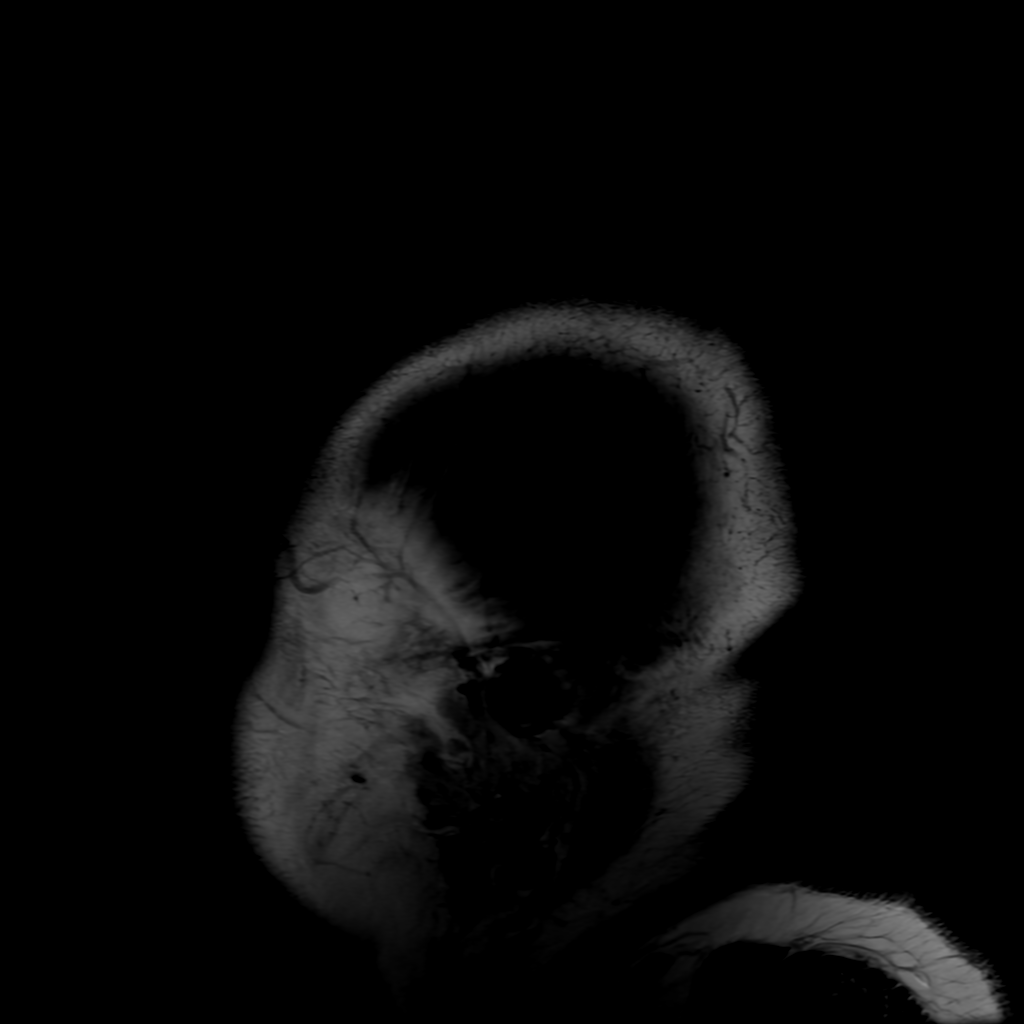
[im 23/23]
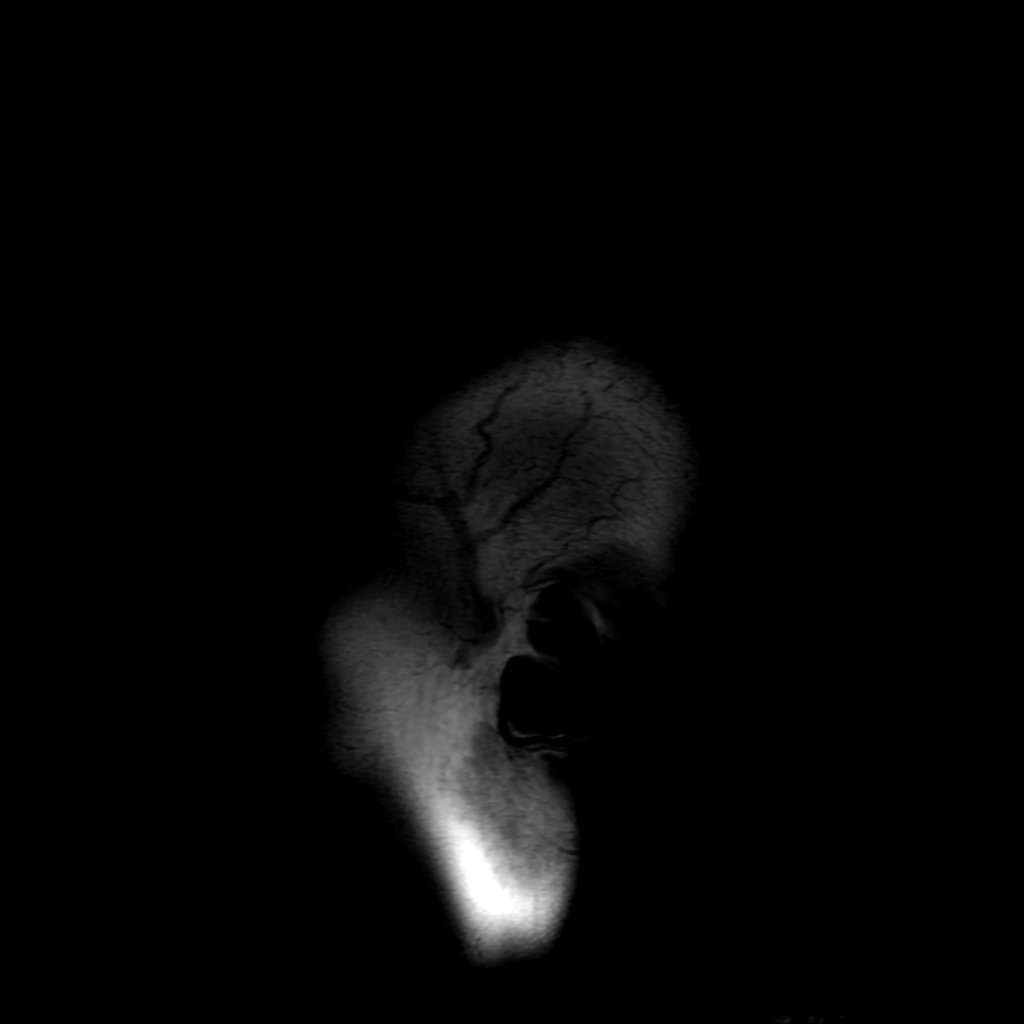

[Series 6: FLAIR · axial · 4.0mm · 0.45mm/px · z∈[-46,+93]mm · 3 of 34 slices shown (2 of 2)]
[im 1/34]
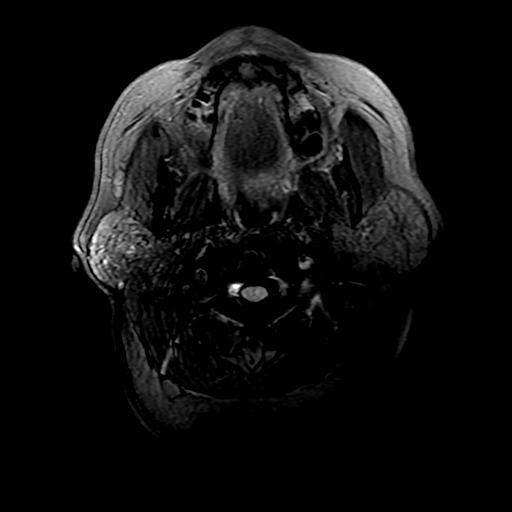
[im 17/34]
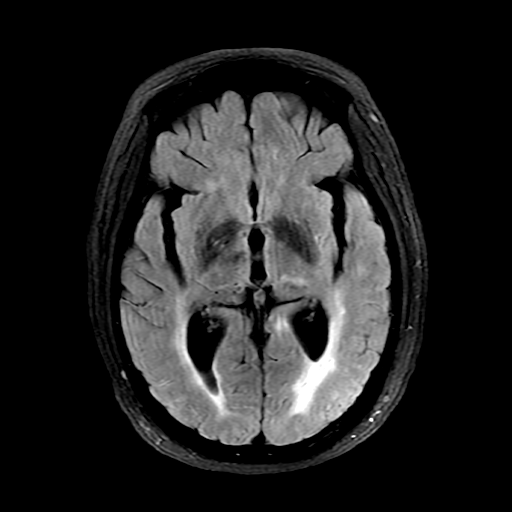
[im 34/34]
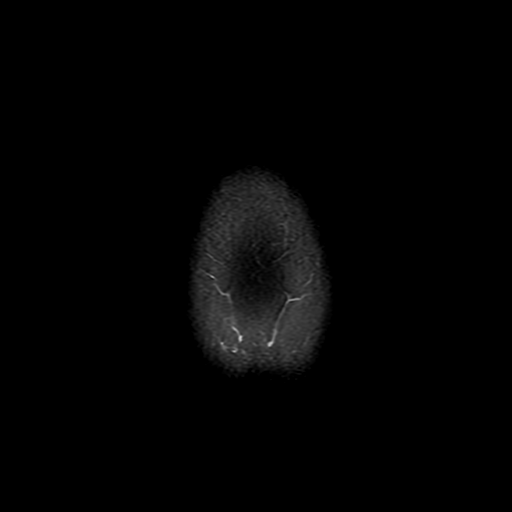

[Series 250: ADC · axial · 3.0mm · 0.94mm/px · z∈[-47,+94]mm · 5 of 50 slices shown (1 of 2)]
[im 1/50]
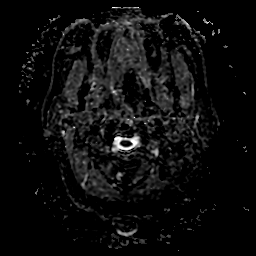
[im 13/50]
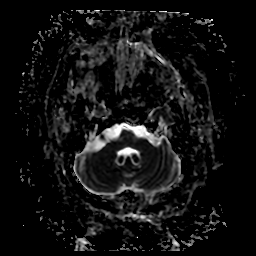
[im 25/50]
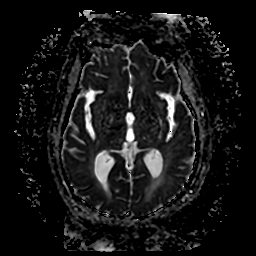
[im 37/50]
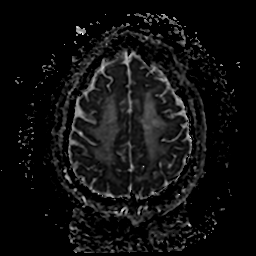
[im 50/50]
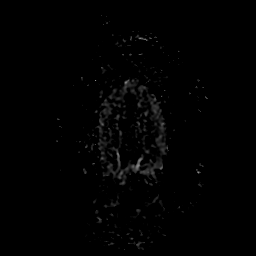

[Series 350: ADC · coronal · 4.0mm · 0.94mm/px · 3 of 37 slices shown (2 of 2)]
[im 1/37]
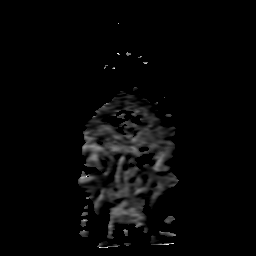
[im 19/37]
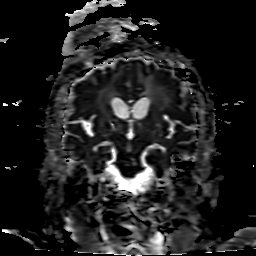
[im 37/37]
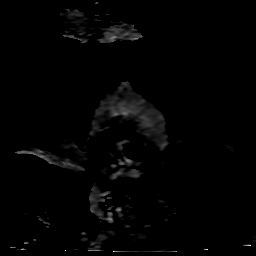

[29 of 48 positions shown; findings below may reference images not displayed]

FINDINGS: Brain: No evidence of acute infarct or acute hemorrhage. No mass
lesion, midline shift, hydrocephalus, or mass lesion. Similar
atrophy with probably ex vacuo ventricular dilation. Similar remote
infarcts involving the left basal ganglia bilateral thalami, left
pons and right cerebellum. Similar additional patchy and confluent
moderate to advanced T2 hyperintensities in the white matter,
nonspecific but compatible with chronic microvascular ischemic
disease. No substantial change in innumerable foci of susceptibility
artifact throughout the supratentorial and infratentorial brain,
including cerebral hemispheres, basal ganglia, brainstem and
cerebellum.

Vascular: Major arterial flow voids are maintained at the skull
base.

Skull and upper cervical spine: Diffuse T1 hypointensity of the bone
marrow. Degenerative changes of the cervical spine.

Sinuses/Orbits: Right maxillary sinus retention cyst. Mild paranasal
sinus mucosal thickening. Unremarkable orbits.

Other: No mastoid effusions.
IMPRESSION: 1. No evidence of acute intracranial abnormality.
2. Similar innumerable foci of susceptibility artifact throughout
the infratentorial and supratentorial brain compatible with the
sequela of chronic microhemorrhages and potentially secondary to
chronic hypertension and/or amyloid angiopathy.
3. Moderate to advanced chronic microvascular ischemic disease with
multiple remote infarcts, detailed above.

Diffuse T1 hypointensity of the bone marrow, which is nonspecific
but can be seen with chronic anemia, chronic hypoxia (such as in
smokers), or less commonly underlying lymphoproliferative disorder.

## 2021-08-27 IMAGING — CT CT HEAD W/O CM
3 series · 16 of 47 positions shown, 19 images · non-contrast
Comparison: None.

CLINICAL DATA: Diplopia, dizziness, hypertension

EXAM:
CT HEAD WITHOUT CONTRAST
TECHNIQUE: Contiguous axial images were obtained from the base of the skull
through the vertex without intravenous contrast.

[Series 4: head 5.0 h30s · axial · 0.44mm/px · z∈[-136,-11]mm · 10 of 31 slices shown, 13 images]
[im 3/31  brain]
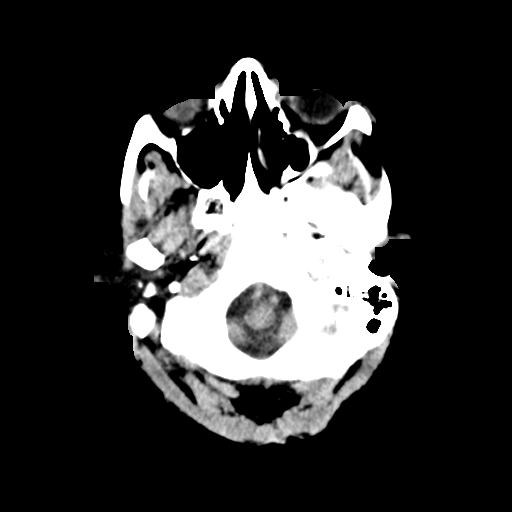
[im 3/31  bone]
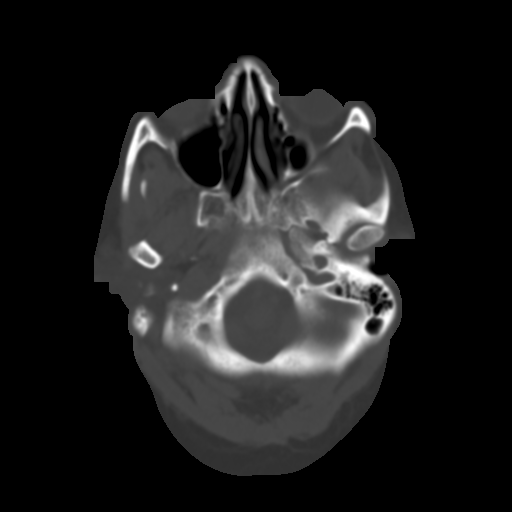
[im 6/31  brain]
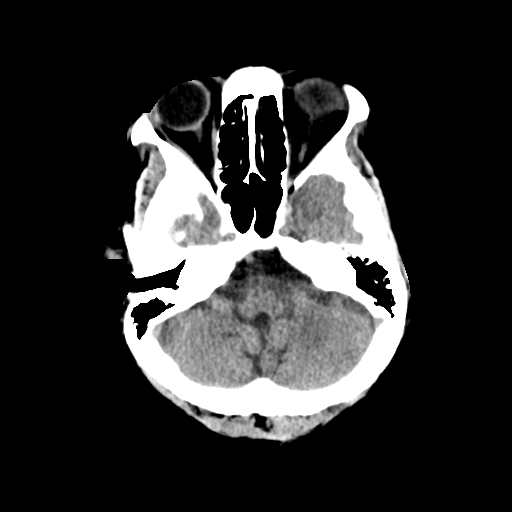
[im 9/31  brain]
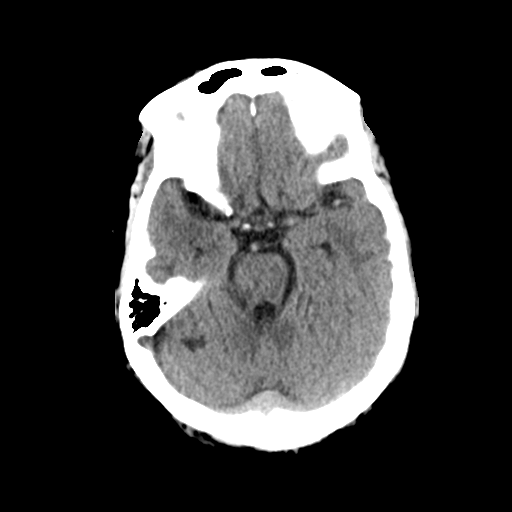
[im 11/31  brain]
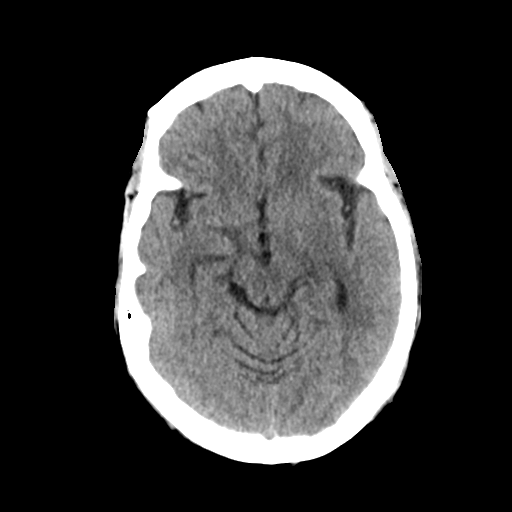
[im 14/31  brain]
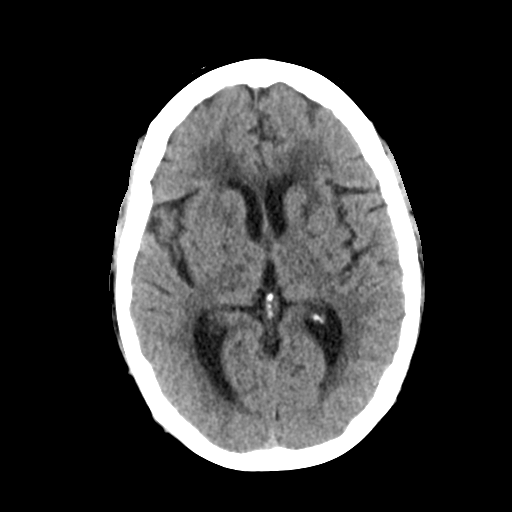
[im 14/31  bone]
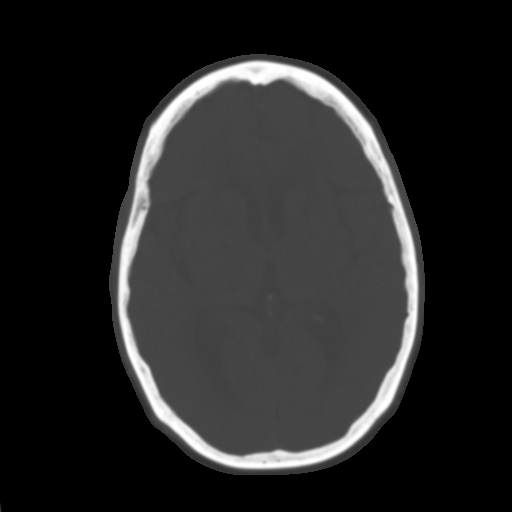
[im 17/31  brain]
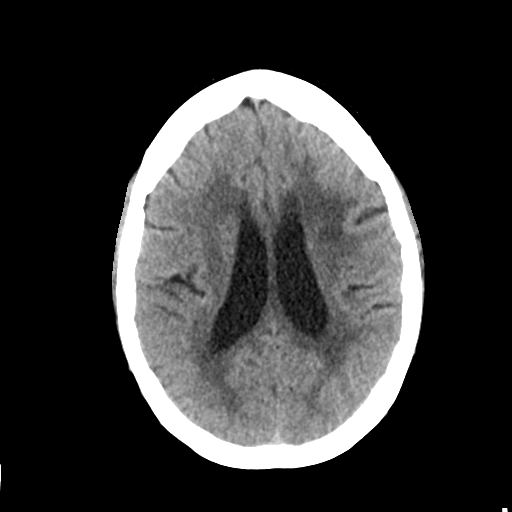
[im 20/31  brain]
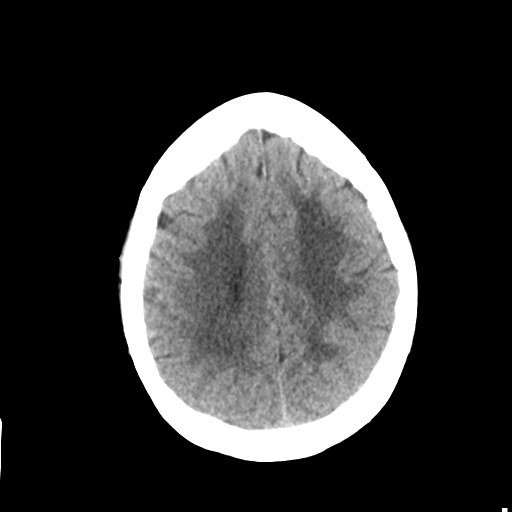
[im 23/31  brain]
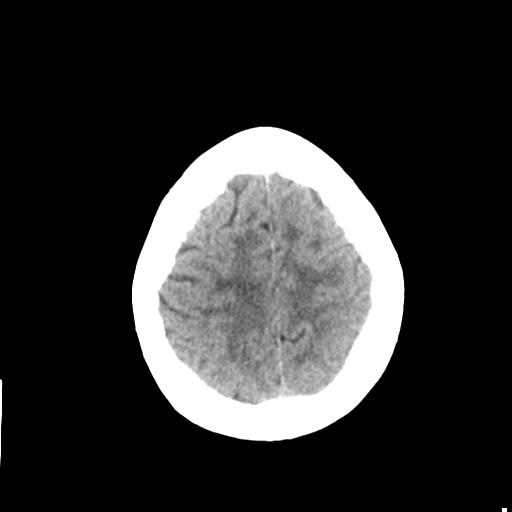
[im 25/31  brain]
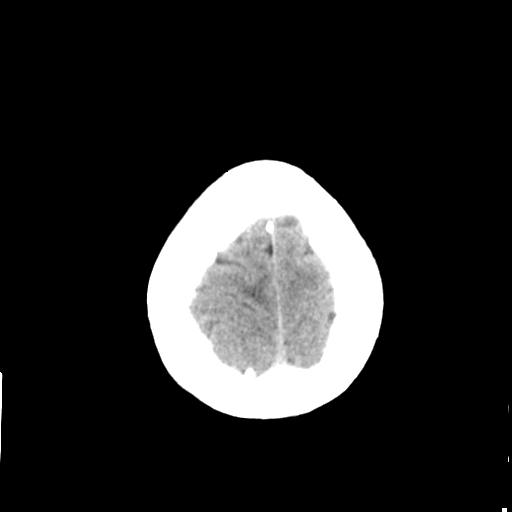
[im 25/31  bone]
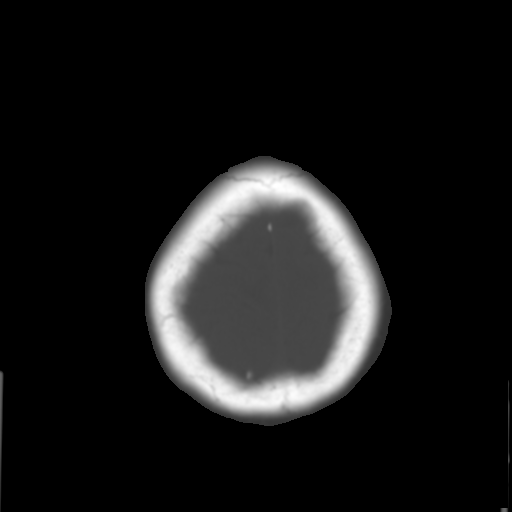
[im 28/31  brain]
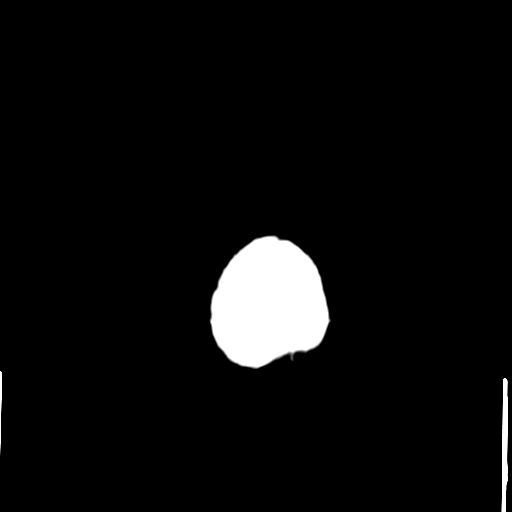

[Series 5: head 3.0 mpr cor · coronal · 0.31mm/px · 3 of 67 slices shown]
[im 23/67  brain]
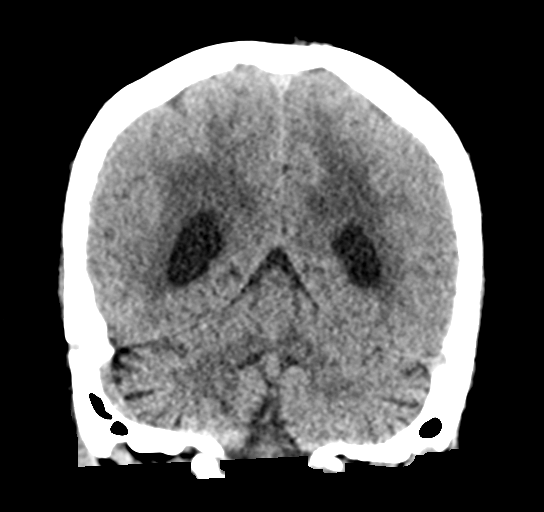
[im 30/67  brain]
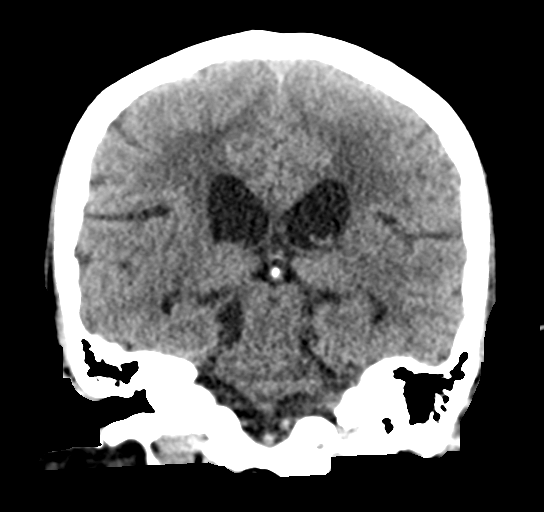
[im 37/67  brain]
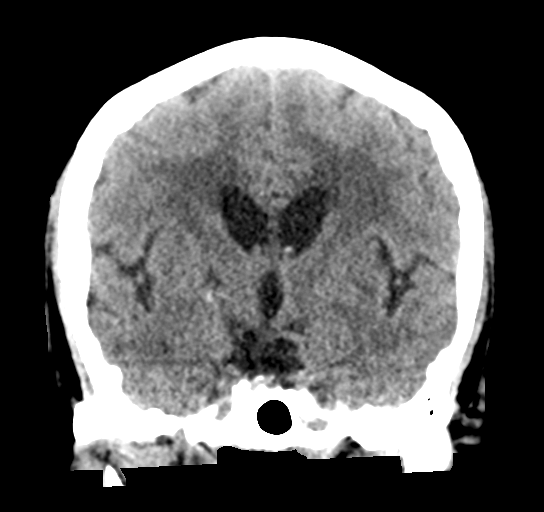

[Series 6: head 3.0 mpr sag · sagittal · 0.30mm/px · 3 of 67 slices shown]
[im 23/67  brain]
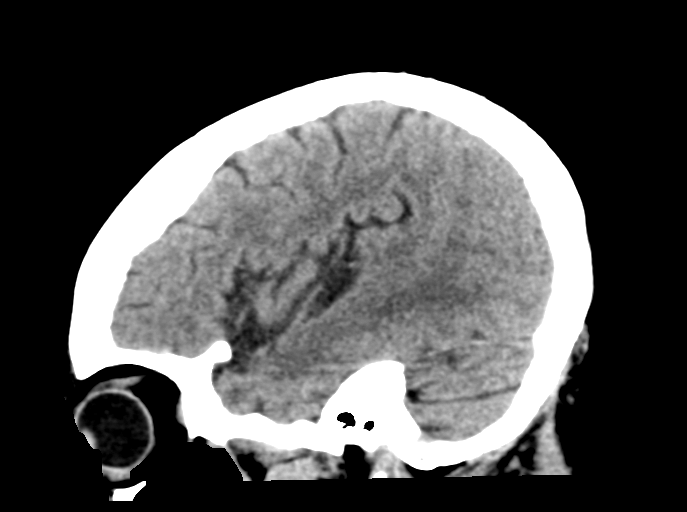
[im 34/67  brain]
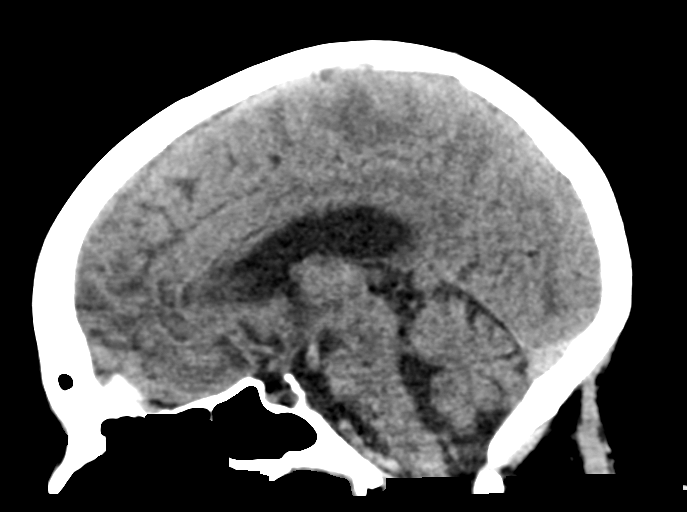
[im 45/67  brain]
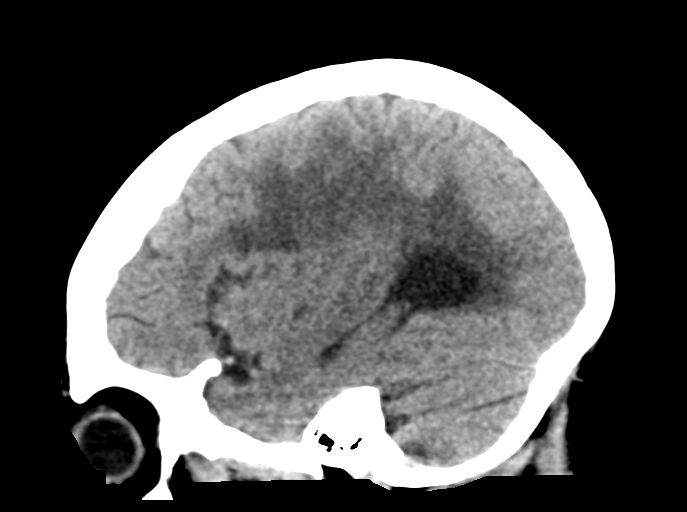

[16 of 47 positions shown; findings below may reference images not displayed]

FINDINGS: Brain: No evidence of acute infarction, hemorrhage, hydrocephalus,
extra-axial collection or mass lesion/mass effect. Periventricular
and deep white matter hypodensity.

Vascular: No hyperdense vessel or unexpected calcification.

Skull: Normal. Negative for fracture or focal lesion.

Sinuses/Orbits: No acute finding.

Other: None.
IMPRESSION: No acute intracranial pathology. Small-vessel white matter disease.

## 2021-08-27 NOTE — ED Provider Notes (Signed)
[facility] EMERGENCY DEPARTMENT Provider Note   CSN: [id number] Arrival date & time: 08/27/21  1046     History Chief Complaint  Patient presents with   HTN   Fall    Sheila Perez is a 75 y.o. female.   Fall Pertinent negatives include no chest pain, no abdominal pain and no shortness of breath. Patient presents with vision changes.  Patient states she is having blurry vision in both her eyes.  States she woke up this morning.  Was normal when she went to bed last night.  States it looks as if she is looking through a fog.  No headache.  States she has not had things like this before.  States she did slide off the bed but did not hurt anything with fall.  States that she cannot tell when her blood pressure is high.  It appears that PCP has been adjusting blood pressure medicine recently.  Has a history of cerebral amyloid angiopathy.  Also previous strokes and microhemorrhages.     Past Medical History:  Diagnosis Date   Hyperlipidemia    Hypertension     Patient Active Problem List   Diagnosis Date Noted   Vitamin D deficiency, unspecified 04/18/2021   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CODE) 04/18/2021   Morbid obesity ([place]) 05/19/2020   PAD (peripheral artery disease) ([place]) 05/07/2020   Urinary incontinence in female 02/27/2020   Hyperlipidemia 02/18/2020   Prediabetes 02/18/2020   CVA (cerebral vascular accident) ([place]) 02/18/2020   Hypertension, essential, benign 01/30/2020    Past Surgical History:  Procedure Laterality Date   ABDOMINAL HYSTERECTOMY     maxilo facial surgery       OB History   No obstetric history on file.     Family History  Problem Relation Age of Onset   Kidney disease Mother    Diabetes Mother    Alcohol abuse Father    Diabetes Sister    Diabetes Daughter    Diabetes Sister     Social History   Tobacco Use   Smoking status: Never   Smokeless tobacco: Never  Vaping Use   Vaping Use: Never used   Substance Use Topics   Alcohol use: No   Drug use: No    Home Medications Prior to Admission medications   Medication Sig Start Date End Date Taking? Authorizing Provider  acetaminophen (TYLENOL) 500 MG tablet Take 500 mg by mouth every 6 (six) hours as needed for mild pain.   Yes [provider]  amLODipine (NORVASC) 10 MG tablet Take 1 tablet (10 mg total) by mouth daily. 07/22/21  Yes [name], [name], MD  atorvastatin (LIPITOR) 20 MG tablet TAKE 1 TABLET EVERY DAY Patient taking differently: Take 20 mg by mouth daily. 07/22/21  Yes [name], [name], MD  benazepril (LOTENSIN) 10 MG tablet TAKE 1 TABLET(10 MG) BY MOUTH DAILY Patient taking differently: Take 10 mg by mouth daily. 08/24/21  Yes [name], [name], MD  cholecalciferol (VITAMIN D3) 25 MCG (1000 UNIT) tablet Take 1,000 Units by mouth daily.   Yes [provider]  co-enzyme Q-10 30 MG capsule Take 30 mg by mouth daily.   Yes [provider]  hydrALAZINE (APRESOLINE) 25 MG tablet TAKE 1 TABLET(25 MG) BY MOUTH THREE TIMES DAILY Patient taking differently: Take 25 mg by mouth 3 (three) times daily. 08/24/21  Yes [name], [name], MD  cephALEXin (KEFLEX) 500 MG capsule Take 1 capsule (500 mg total) by mouth 3 (three) times  daily. Patient not taking: No sig reported 07/21/21   Petrucelli, Glynda Jaeger, PA-C    Allergies    Patient has no known allergies.  Review of Systems   Review of Systems  Constitutional:  Negative for appetite change.  Eyes:  Positive for visual disturbance.  Respiratory:  Negative for shortness of breath.   Cardiovascular:  Negative for chest pain.  Gastrointestinal:  Negative for abdominal pain.  Genitourinary:  Negative for flank pain.  Skin:  Negative for rash.  Neurological:  Negative for light-headedness.  Psychiatric/Behavioral:  Negative for confusion.    Physical Exam Updated Vital Signs BP (!) 172/86   Pulse 76   Temp 97.9 F (36.6 C) (Oral)   Resp 20   SpO2 99%    Physical Exam Vitals and nursing note reviewed.  HENT:     Head: Atraumatic.  Eyes:     Extraocular Movements: Extraocular movements intact.     Pupils: Pupils are equal, round, and reactive to light.  Cardiovascular:     Rate and Rhythm: Regular rhythm.  Pulmonary:     Effort: Pulmonary effort is normal.  Abdominal:     Tenderness: There is no abdominal tenderness.  Musculoskeletal:        General: Tenderness present.     Cervical back: Neck supple.  Skin:    Capillary Refill: Capillary refill takes less than 2 seconds.  Neurological:     Mental Status: She is alert and oriented to person, place, and time.     Comments: Awake and appropriate, but somewhat slow to answer.  Reportedly baseline for patient.  Apparent blurred vision with both eyes.  Eye movements grossly intact.  However states that she does see 2 fingers when 1 finger held in front of her.  Conjugate gaze appears intact however.  Regular strength bilaterally.  Similar strength with straight leg raise bilaterally.  Face symmetric    ED Results / Procedures / Treatments   Labs (all labs ordered are listed, but only abnormal results are displayed) Labs Reviewed  COMPREHENSIVE METABOLIC PANEL - Abnormal; Notable for the following components:      Result Value   Calcium 8.7 (*)    Albumin 3.2 (*)    All other components within normal limits  URINALYSIS, ROUTINE W REFLEX MICROSCOPIC - Abnormal; Notable for the following components:   Ketones, ur 5 (*)    Protein, ur 30 (*)    All other components within normal limits  CBC WITH DIFFERENTIAL/PLATELET  TROPONIN I (HIGH SENSITIVITY)  TROPONIN I (HIGH SENSITIVITY)    EKG EKG Interpretation  Date/Time:  Saturday August 27 2021 11:43:22 EDT Ventricular Rate:  74 PR Interval:  175 QRS Duration: 98 QT Interval:  397 QTC Calculation: 441 R Axis:   14 Text Interpretation: Sinus rhythm Left ventricular hypertrophy Confirmed by Davonna Belling [phone]) on  08/27/2021 12:18:32 PM  Radiology CT HEAD WO CONTRAST (5MM)  Result Date: 08/27/2021 CLINICAL DATA:  Diplopia, dizziness, hypertension EXAM: CT HEAD WITHOUT CONTRAST TECHNIQUE: Contiguous axial images were obtained from the base of the skull through the vertex without intravenous contrast. COMPARISON:  None. FINDINGS: Brain: No evidence of acute infarction, hemorrhage, hydrocephalus, extra-axial collection or mass lesion/mass effect. Periventricular and deep white matter hypodensity. Vascular: No hyperdense vessel or unexpected calcification. Skull: Normal. Negative for fracture or focal lesion. Sinuses/Orbits: No acute finding. Other: None. IMPRESSION: No acute intracranial pathology. Small-vessel white matter disease. Electronically Signed   By: Delanna Ahmadi M.D.   On: 08/27/2021 12:27  MR BRAIN WO CONTRAST  Result Date: 08/27/2021 CLINICAL DATA:  Diplopia EXAM: MRI HEAD WITHOUT CONTRAST TECHNIQUE: Multiplanar, multiecho pulse sequences of the brain and surrounding structures were obtained without intravenous contrast. COMPARISON:  Same day head CT.  MRI head January 12, 2021. FINDINGS: Brain: No evidence of acute infarct or acute hemorrhage. No mass lesion, midline shift, hydrocephalus, or mass lesion. Similar atrophy with probably ex vacuo ventricular dilation. Similar remote infarcts involving the left basal ganglia bilateral thalami, left pons and right cerebellum. Similar additional patchy and confluent moderate to advanced T2 hyperintensities in the white matter, nonspecific but compatible with chronic microvascular ischemic disease. No substantial change in innumerable foci of susceptibility artifact throughout the supratentorial and infratentorial brain, including cerebral hemispheres, basal ganglia, brainstem and cerebellum. Vascular: Major arterial flow voids are maintained at the skull base. Skull and upper cervical spine: Diffuse T1 hypointensity of the bone marrow. Degenerative changes of  the cervical spine. Sinuses/Orbits: Right maxillary sinus retention cyst. Mild paranasal sinus mucosal thickening. Unremarkable orbits. Other: No mastoid effusions. IMPRESSION: 1. No evidence of acute intracranial abnormality. 2. Similar innumerable foci of susceptibility artifact throughout the infratentorial and supratentorial brain compatible with the sequela of chronic microhemorrhages and potentially secondary to chronic hypertension and/or amyloid angiopathy. 3. 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 Diffuse T1 hypointensity of the bone marrow, which is nonspecific but can be seen with chronic anemia, chronic hypoxia (such as in smokers), or less commonly underlying lymphoproliferative disorder. Electronically Signed   By: Margaretha Sheffield M.D.   On: 08/27/2021 15:29    Procedures Procedures   Medications Ordered in ED Medications - No data to display  ED Course  I have reviewed the triage vital signs and the nursing notes.  Pertinent labs & imaging results that were available during my care of the patient were reviewed by me and considered in my medical decision making (see chart for details).    MDM Rules/Calculators/A&P                           Patient presents with blurred vision.  Woke with this morning.  Bilateral lateral.  Questionable double vision.  Not really elicitable on exam.  MRI done and reassuring.  CT reassuring.  Lab work appears to be at baseline.  Blood pressure improved somewhat and is scheduled to have an appointment in 2 days to have it adjusted by her PCP.  Symptoms have improved.  Potentially could be component of hypertension but with improving symptoms do not want adjust now when it has been closely monitored by her PCP.  Nonfocal exam.  Discharge home with outpatient follow-up. Final Clinical Impression(s) / ED Diagnoses Final diagnoses:  Hypertension, unspecified type  Blurred vision     Rx / DC Orders ED Discharge Orders     None        Davonna Belling, MD 08/27/21 1723

## 2021-08-27 NOTE — ED Triage Notes (Addendum)
Pt BIB [facility] d/t HTN. EMS reports they were initially called to pt's home d/t a fall, which pt endorses the fall causing the blurred vision she is having now. Pt denies HA, dizziness, or n/v. A/Ox4- slow to speak at baseline, 240/130, 80 bpm, Resp 18, 98% on RA.

## 2021-08-27 NOTE — Consult Note (Signed)
Neurology Consultation  Reason for Consult: double vision ? focal seizures.  Referring Physician: Robyn Haber., MD.   CC: blurred/double vision and ? Focal seizure right hand.   History is obtained from: patient and chart.   HPI: IONGEXBM Taheera Thomann is a 75 y.o. female with a PMHx of HTN, PAD, CV, HLD, and cerebral amyloid angiopathy. Patient presented today after waking up to blurry and double vision. She states she went to bed fine, but in the am, she rolled out of her bed onto the floor when trying to get up. She denies weakness of an extremity or any seizure like activity. No history of seizures.    Workup in ED with normal labs and negative UA. CTH with no new acute finding but with evidence of old stroke. MRI brain without new stroke with evidence of angiopathy.   She tells NP that she is still having double vision every now and then. 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   In review of chart, out patient neurology note on 08/12/21 by Dr. Tomi Likens--" Chronic microhemorrhages in brain - appears more abundant compared to last year - based on location, likely due to both chronic hypertension and cerebral amyloid angiopathy.  Involvement of deep gray matter, brainstem and cerebellum typically seen with hypertension.  There appears more abundant microhemorrhages in the cerebral white matter and cortex, which may be seen in cerebral amyloid angiopathy.  Also, blood pressure over the past year has been relatively controlled, so I cannot attribute this increase to blood pressure."  Also, saw some notes from family medicine addressing her BP and changing her medications in 10/22.   Neurology asked to consult for vision changes, ? Seizure.   ROS: A robust ROS was performed and is negative except as noted in the HPI.    Past Medical History:  Diagnosis Date   Hyperlipidemia    Hypertension     Family History  Problem Relation Age of Onset   Kidney disease Mother    Diabetes Mother    Alcohol abuse Father    Diabetes Sister    Diabetes Daughter    Diabetes Sister    Social History:   reports that she has never smoked. She has never used smokeless tobacco. She reports that she does not drink alcohol and does not use drugs.  Medications No current facility-administered medications for this encounter.  Current Outpatient Medications:    acetaminophen (TYLENOL) 500 MG tablet, Take 500 mg by mouth every 6 (six) hours as needed for mild pain., Disp: , Rfl:    amLODipine (NORVASC) 10 MG tablet, Take 1 tablet (10 mg total) by mouth daily., Disp: 90 tablet, Rfl: 1   atorvastatin (LIPITOR) 20 MG tablet, TAKE 1 TABLET EVERY DAY (Patient taking differently: Take 20 mg by mouth daily.), Disp: 90 tablet, Rfl: 3   benazepril (LOTENSIN) 10 MG tablet, TAKE 1 TABLET(10 MG) BY MOUTH DAILY (Patient taking differently: Take 10 mg by mouth daily.), Disp: 90 tablet, Rfl: 0   cholecalciferol (VITAMIN D3) 25 MCG (1000 UNIT) tablet, Take 1,000 Units by mouth daily., Disp: , Rfl:    co-enzyme Q-10 30 MG capsule, Take 30 mg by mouth daily., Disp: , Rfl:    hydrALAZINE (APRESOLINE) 25 MG tablet, TAKE 1 TABLET(25 MG) BY MOUTH THREE TIMES DAILY (Patient taking differently: Take 25 mg by mouth 3 (three) times daily.), Disp:  270 tablet, Rfl: 0   cephALEXin (KEFLEX) 500 MG capsule, Take 1 capsule (500 mg total) by mouth 3 (three) times daily. (Patient not taking: No sig reported), Disp: 21 capsule, Rfl: 0   Exam: Current vital signs: BP (!) 183/90   Pulse 74   Temp 97.9 F (36.6 C) (Oral)   Resp 19   SpO2 100%  Vital signs in last 24 hours: Temp:  [97.9 F (36.6 C)] 97.9 F (36.6 C) (10/15 1054) Pulse Rate:  [69-76] 74 (10/15 1400) Resp:  [6-20] 19 (10/15 1400) BP: (177-207)/(82-108) 183/90 (10/15 1400) SpO2:  [97 %-100 %] 100 % (10/15 1400)  PE: GENERAL: Fairly well, chronically ill  appearing female. Awake, alert in NAD.  HEENT: normocephalic and atraumatic. LUNGS - Normal respiratory effort.  CV - RRR on tele. ABDOMEN - Soft, nontender. Ext: warm, well perfused. Psych: affect light.   NEURO:  Mental Status: Alert and oriented x 4. Follows commands.  Speech/Language: speech is without dysarthria or aphasia.  Fluency and comprehension intact.  Cranial Nerves:  II: PERRL 3 mm/brisk. Visual fields full without c/o diplopia.  III, IV, VI: EOMI. No nystagmus. Slight ptosis of OS.   V: sensation is intact and symmetrical to face.  VII: Smile is symmetrical.   VIII:hearing intact to voice. IX, X: palate elevation is symmetric. Phonation normal.  XI: normal sternocleidomastoid and trapezius muscle strength. GYB:WLSLHT is symmetrical without fasciculations.   Motor:  Strength is 5/5 throughout.  Tone is normal. Bulk is normal.  Sensation- Intact to light touch bilaterally in all four extremities.  Coordination-No tremors, twitching, jerking, staring episodes. No drift.    Labs I have reviewed labs in epic and the results pertinent to this consultation are:  CBC    Component Value Date/Time   WBC 7.1 08/27/2021 1126   RBC 4.36 08/27/2021 1126   HGB 13.2 08/27/2021 1126   HCT 40.9 08/27/2021 1126   PLT 251 08/27/2021 1126   MCV 93.8 08/27/2021 1126   MCH 30.3 08/27/2021 1126   MCHC 32.3 08/27/2021 1126   RDW 13.8 08/27/2021 1126   LYMPHSABS 1.7 08/27/2021 1126   MONOABS 0.5 08/27/2021 1126   EOSABS 0.4 08/27/2021 1126   BASOSABS 0.1 08/27/2021 1126   CMP     Component Value Date/Time   NA 137 08/27/2021 1126   K 4.0 08/27/2021 1126   CL 105 08/27/2021 1126   CO2 24 08/27/2021 1126   GLUCOSE 94 08/27/2021 1126   BUN 16 08/27/2021 1126   CREATININE 0.93 08/27/2021 1126   CREATININE 1.10 (H) 01/07/2021 1543   CALCIUM 8.7 (L) 08/27/2021 1126   PROT 6.6 08/27/2021 1126   ALBUMIN 3.2 (L) 08/27/2021 1126   AST 15 08/27/2021 1126   ALT 12 08/27/2021  1126   ALKPHOS 54 08/27/2021 1126   BILITOT 0.7 08/27/2021 1126   GFRNONAA >60 08/27/2021 1126   GFRNONAA 57 (L) 09/08/2020 1642   GFRAA 66 09/08/2020 1642   MRI brain -no evidence of acute intracranial abnormality. -Similar innumerable foci of susceptibility artifact throughout the infratentorial and supratentorial brain compatible with the sequela of chronic microhemorrhages and potentially secondary to chronic hypertension and/or amyloid angiopathy. 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  -Diffuse T1 hypointensity of the bone marrow, which is nonspecific but can be seen with chronic anemia, chronic hypoxia (such as in smokers), or less commonly underlying lymphoproliferative disorder.  Assessment: 75 yo female who presented after waking up this am with double  and blurred vision and after rolling out of the bed. We obtained a CTH which was negative for acute finding, as well as, MRI brain which showed remote strokes, evidence of known angiopathy, but no new stroke. NP does not quite know the significance of right arm shaking. Patient can not remember doing this and has not repeated since being in ED. Therefore, NP is not that keen on getting in patient EEG as it will likely be of low yield. There are no signs on exam consistent with double vision.   Recommendations/Plan:  -She is cleared for discharge from neurology standpoint.  -HTN being cared for in ED.  -If vision changes recur, have her see an out patient ophthalmologist.   Pt seen by Clance Boll, NP/Neuro and later by MD. Note/plan to be edited by MD as needed.  Pager: [phone]

## 2021-08-27 NOTE — Discharge Instructions (Addendum)
Your MRI looks similar to before.  Follow-up with Dr. [name] in 2 days as planned to adjust her blood pressure medicine.  Continue take the medicines as prescribed.

## 2021-08-29 ENCOUNTER — Other Ambulatory Visit: Payer: Self-pay

## 2021-08-29 ENCOUNTER — Encounter: Payer: Self-pay | Admitting: Family Medicine

## 2021-08-29 ENCOUNTER — Ambulatory Visit (INDEPENDENT_AMBULATORY_CARE_PROVIDER_SITE_OTHER): Payer: Medicare HMO | Admitting: Family Medicine

## 2021-08-29 VITALS — BP 150/100 | HR 82 | Temp 98.4°F | Resp 16 | Ht 63.0 in | Wt 201.0 lb

## 2021-08-29 DIAGNOSIS — I680 Cerebral amyloid angiopathy: Secondary | ICD-10-CM

## 2021-08-29 DIAGNOSIS — K625 Hemorrhage of anus and rectum: Secondary | ICD-10-CM | POA: Diagnosis not present

## 2021-08-29 DIAGNOSIS — E559 Vitamin D deficiency, unspecified: Secondary | ICD-10-CM | POA: Diagnosis not present

## 2021-08-29 DIAGNOSIS — N3000 Acute cystitis without hematuria: Secondary | ICD-10-CM | POA: Diagnosis not present

## 2021-08-29 DIAGNOSIS — F0390 Unspecified dementia without behavioral disturbance: Secondary | ICD-10-CM | POA: Diagnosis not present

## 2021-08-29 DIAGNOSIS — R1110 Vomiting, unspecified: Secondary | ICD-10-CM

## 2021-08-29 DIAGNOSIS — E785 Hyperlipidemia, unspecified: Secondary | ICD-10-CM | POA: Diagnosis not present

## 2021-08-29 DIAGNOSIS — W06XXXD Fall from bed, subsequent encounter: Secondary | ICD-10-CM

## 2021-08-29 DIAGNOSIS — I10 Essential (primary) hypertension: Secondary | ICD-10-CM

## 2021-08-29 DIAGNOSIS — R7303 Prediabetes: Secondary | ICD-10-CM | POA: Diagnosis not present

## 2021-08-29 DIAGNOSIS — I739 Peripheral vascular disease, unspecified: Secondary | ICD-10-CM | POA: Diagnosis not present

## 2021-08-29 NOTE — Patient Instructions (Addendum)
A few things to remember from today's visit:  Hypertension, essential, benign  Resume Amlodipine 10 mg daily. Continue monitoring blood pressure at home.  If you need refills please call your pharmacy. Do not use My Chart to request refills or for acute issues that need immediate attention.  Monitor for new gastrointestinal symptoms.   Please be sure medication list is accurate. If a new problem present, please set up appointment sooner than planned today.

## 2021-08-29 NOTE — Progress Notes (Signed)
HPI: Sheila Perez is a 75 y.o. female, who is here today with her daughter for follow up.   She was last seen on 08/01/21. Since her last visit she was evaluated in the ED, 08/27/21, when she was having headache, elevated BP,and diplopia. Her daughter called the fire department because she fell from her bed and she cannot get up. BP was checked and elevated at 288/100's. Head CT and brain MRI done on 08/27/21. Head CT:No acute intracranial pathology. Small-vessel white matter disease.  Brain MRI: 1. No evidence of acute intracranial abnormality. 2. Similar innumerable foci of susceptibility artifact throughout the infratentorial and supratentorial brain compatible with the sequela of chronic microhemorrhages and potentially secondary to chronic hypertension and/or amyloid angiopathy. 3. 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   Diffuse T1 hypointensity of the bone marrow, which is nonspecific but can be seen with chronic anemia, chronic hypoxia (such as in smokers), or less commonly underlying lymphoproliferative disorder.    Lab Results  Component Value Date   WBC 7.1 08/27/2021   HGB 13.2 08/27/2021   HCT 40.9 08/27/2021   MCV 93.8 08/27/2021   PLT 251 08/27/2021   She has not had antihypertensive medication in 2 days.  ED visit on 08/10/21 because elevated BP.She was started on Hydralazine 25 mg tid. Her daughter thought she was supposed to stop Amlodipine because of new medication. Daughter thinks Hydralazine was causing her headaches,vision changes, and dizziness, so discontinued. Symptoms resolved. She has not been on Benazepril for a few weeks, daughter is not sure why it was discontinued.  BP readings at home: 172/86,197/88,189/105,171/89,152/87. She is not having headache,CP,SOB,palpitations,edema,or focal weakness.  Lab Results  Component Value Date   CREATININE 0.93 08/27/2021   BUN  16 08/27/2021   NA 137 08/27/2021   K 4.0 08/27/2021   CL 105 08/27/2021   CO2 24 08/27/2021   Today she had a "big" stool and vomited x 1,food content. She denies nausea,abdominal pain,or urinary symptoms. She looks tired today, had an episode of regurgitation (saliva with a few pieces of food) after burping. She denies heartburn. She got a new mattress and has not been sleeping well for the past few days. Her daughter has not noted sleep apnea.  She has lost some wt since her last visit.Her daughter is cooking for her, limiting carbs and sweets. Mobility is still limited. She is receiving PT through [facility].  Review of Systems  Constitutional:  Negative for activity change, appetite change and fever.  HENT:  Negative for mouth sores, nosebleeds and trouble swallowing.   Eyes:  Negative for pain and redness.  Respiratory:  Negative for cough and wheezing.   Genitourinary:  Negative for decreased urine volume, dysuria and hematuria.  Musculoskeletal:  Positive for gait problem. Negative for myalgias.  Skin:  Negative for pallor and rash.  Neurological:  Negative for syncope, facial asymmetry and weakness.  Psychiatric/Behavioral:  Positive for sleep disturbance. Negative for confusion.   Rest of ROS, see pertinent positives sand negatives in HPI  Current Outpatient Medications on File Prior to Visit  Medication Sig Dispense Refill   acetaminophen (TYLENOL) 500 MG tablet Take 500 mg by mouth every 6 (six) hours as needed for mild pain.     amLODipine (NORVASC) 10 MG tablet Take 1 tablet (10 mg total) by mouth daily. 90 tablet 1   atorvastatin (LIPITOR) 20 MG tablet TAKE 1 TABLET EVERY DAY (Patient taking differently: Take 20 mg  by mouth daily.) 90 tablet 3   cholecalciferol (VITAMIN D3) 25 MCG (1000 UNIT) tablet Take 1,000 Units by mouth daily.     co-enzyme Q-10 30 MG capsule Take 30 mg by mouth daily.     No current facility-administered medications on file prior to visit.   Past  Medical History:  Diagnosis Date   Hyperlipidemia    Hypertension    No Known Allergies  Social History   Socioeconomic History   Marital status: Widowed    Spouse name: Not on file   Number of children: 2   Years of education: 10   Highest education level: 10th grade  Occupational History   Occupation: Retired  Tobacco Use   Smoking status: Never   Smokeless tobacco: Never  Vaping Use   Vaping Use: Never used  Substance and Sexual Activity   Alcohol use: No   Drug use: No   Sexual activity: Not Currently  Other Topics Concern   Not on file  Social History Narrative   Right handed   Lives daughter in a two story home but lives on first floor   Drinks no caffeine    Social Determinants of Health   Financial Resource Strain: Low Risk    Difficulty of Paying Living Expenses: Not hard at all  Food Insecurity: No Food Insecurity   Worried About Charity fundraiser in the Last Year: Never true   Arboriculturist in the Last Year: Never true  Transportation Needs: No Transportation Needs   Lack of Transportation (Medical): No   Lack of Transportation (Non-Medical): No  Physical Activity: Inactive   Days of Exercise per Week: 0 days   Minutes of Exercise per Session: 0 min  Stress: No Stress Concern Present   Feeling of Stress : Not at all  Social Connections: Socially Isolated   Frequency of Communication with Friends and Family: More than three times a week   Frequency of Social Gatherings with Friends and Family: More than three times a week   Attends Religious Services: Never   Marine scientist or Organizations: No   Attends Archivist Meetings: Never   Marital Status: Widowed   Vitals:   08/29/21 1424  BP: (!) 150/100  Pulse: 82  Resp: 16  Temp: 98.4 F (36.9 C)  SpO2: 97%   Wt Readings from Last 3 Encounters:  08/29/21 201 lb (91.2 kg)  08/01/21 206 lb 4 oz (93.6 kg)  07/20/21 208 lb (94.3 kg)   Body mass index is 35.61  kg/m.  Physical Exam Vitals and nursing note reviewed.  Constitutional:      General: She is not in acute distress.    Appearance: She is well-developed.  HENT:     Head: Normocephalic and atraumatic.     Mouth/Throat:     Mouth: Mucous membranes are moist.     Pharynx: Oropharynx is clear.  Eyes:     Conjunctiva/sclera: Conjunctivae normal.  Cardiovascular:     Rate and Rhythm: Normal rate and regular rhythm.     Heart sounds: No murmur heard.    Comments: DP pulses present. Pulmonary:     Effort: Pulmonary effort is normal. No respiratory distress.     Breath sounds: Normal breath sounds.  Abdominal:     Palpations: Abdomen is soft.     Tenderness: There is no abdominal tenderness.  Lymphadenopathy:     Cervical: No cervical adenopathy.  Skin:    General: Skin is warm.  Findings: No erythema or rash.  Neurological:     General: No focal deficit present.     Mental Status: She is alert.     Comments: She is in a wheel chair during visit.  Psychiatric:     Comments: Well groomed, good eye contact.   ASSESSMENT AND PLAN:  Sheila Perez was seen today for follow-up.  Diagnoses and all orders for this visit:  Vomiting, unspecified vomiting type, unspecified whether nausea present One at home. Episode during visit today was more like regurgitation, did not seem to be preceded by nausea and happened after burping. She denies GI symptoms. We discussed possible causes, ? GERD. Her daughter prefers to try home remedies instead PPI. Monitor for new symptoms. Instructed about warning signs.  Hypertension, essential, benign We discussed pharmacologic treatment options and side effects. She tolerated Amlodipine well, so her daughter agrees with resuming it at same dose, 10 mg daily. We discussed possible complications of elevated BP. Continue low sat diet. Continue monitoring BP regularly. Instructed about warning signs.  Cerebral amyloid angiopathy  (CODE) We reviewed head CT and brain MRI report. Some abnormalities have been seen on prior head imaging, amyloid angiopathy. T1 hypodensity of bone marrow seen on MRI, we discussed possible causes.  She is following with neurologist.  Morbid obesity ([place]) She has lost about 7 Lb since ehr last visit , 08/01/21. Encouraged to continue a healthful diet and increase mobility as tolerated.  Fall from bed, subsequent encounter Fall precautions discussed. Continue PT.  I spent a total of 43 minutes in both face to face and non face to face activities for this visit on the date of this encounter. During this time history was obtained and documented, examination was performed, prior labs/imaging reviewed, and assessment/plan discussed.  Return in about 23 days (around 09/21/2021) for Fasting labs and BP check..   Shafter Jupin G. [name], MD  [facility]. [place] office.

## 2021-08-31 DIAGNOSIS — R7303 Prediabetes: Secondary | ICD-10-CM | POA: Diagnosis not present

## 2021-08-31 DIAGNOSIS — N3000 Acute cystitis without hematuria: Secondary | ICD-10-CM | POA: Diagnosis not present

## 2021-08-31 DIAGNOSIS — F0390 Unspecified dementia without behavioral disturbance: Secondary | ICD-10-CM | POA: Diagnosis not present

## 2021-08-31 DIAGNOSIS — I739 Peripheral vascular disease, unspecified: Secondary | ICD-10-CM | POA: Diagnosis not present

## 2021-08-31 DIAGNOSIS — K625 Hemorrhage of anus and rectum: Secondary | ICD-10-CM | POA: Diagnosis not present

## 2021-08-31 DIAGNOSIS — E559 Vitamin D deficiency, unspecified: Secondary | ICD-10-CM | POA: Diagnosis not present

## 2021-08-31 DIAGNOSIS — E785 Hyperlipidemia, unspecified: Secondary | ICD-10-CM | POA: Diagnosis not present

## 2021-08-31 DIAGNOSIS — I10 Essential (primary) hypertension: Secondary | ICD-10-CM | POA: Diagnosis not present

## 2021-09-06 DIAGNOSIS — F0390 Unspecified dementia without behavioral disturbance: Secondary | ICD-10-CM | POA: Diagnosis not present

## 2021-09-06 DIAGNOSIS — E559 Vitamin D deficiency, unspecified: Secondary | ICD-10-CM | POA: Diagnosis not present

## 2021-09-06 DIAGNOSIS — E785 Hyperlipidemia, unspecified: Secondary | ICD-10-CM | POA: Diagnosis not present

## 2021-09-06 DIAGNOSIS — I739 Peripheral vascular disease, unspecified: Secondary | ICD-10-CM | POA: Diagnosis not present

## 2021-09-06 DIAGNOSIS — N3000 Acute cystitis without hematuria: Secondary | ICD-10-CM | POA: Diagnosis not present

## 2021-09-06 DIAGNOSIS — K625 Hemorrhage of anus and rectum: Secondary | ICD-10-CM | POA: Diagnosis not present

## 2021-09-06 DIAGNOSIS — I10 Essential (primary) hypertension: Secondary | ICD-10-CM | POA: Diagnosis not present

## 2021-09-06 DIAGNOSIS — R7303 Prediabetes: Secondary | ICD-10-CM | POA: Diagnosis not present

## 2021-09-07 ENCOUNTER — Telehealth: Payer: Self-pay

## 2021-09-07 DIAGNOSIS — F0390 Unspecified dementia without behavioral disturbance: Secondary | ICD-10-CM | POA: Diagnosis not present

## 2021-09-07 DIAGNOSIS — K625 Hemorrhage of anus and rectum: Secondary | ICD-10-CM | POA: Diagnosis not present

## 2021-09-07 DIAGNOSIS — N3000 Acute cystitis without hematuria: Secondary | ICD-10-CM | POA: Diagnosis not present

## 2021-09-07 DIAGNOSIS — I10 Essential (primary) hypertension: Secondary | ICD-10-CM | POA: Diagnosis not present

## 2021-09-07 DIAGNOSIS — E559 Vitamin D deficiency, unspecified: Secondary | ICD-10-CM | POA: Diagnosis not present

## 2021-09-07 DIAGNOSIS — E785 Hyperlipidemia, unspecified: Secondary | ICD-10-CM | POA: Diagnosis not present

## 2021-09-07 DIAGNOSIS — R7303 Prediabetes: Secondary | ICD-10-CM | POA: Diagnosis not present

## 2021-09-07 DIAGNOSIS — I739 Peripheral vascular disease, unspecified: Secondary | ICD-10-CM | POA: Diagnosis not present

## 2021-09-07 NOTE — Telephone Encounter (Signed)
Pt daughter is calling to report patient bp was taken between 115 pm and 130 pm today  it was 150/100

## 2021-09-07 NOTE — Telephone Encounter (Signed)
[facility] called stating that patient is having a high B/P reading and is asymptomatic and taking B/P Rx as prescribed and to contact daughter if there is any changes to her medication is needed.

## 2021-09-07 NOTE — Telephone Encounter (Signed)
I left pt's daughter a voicemail to return my call. We need to know what the blood pressure readings are.

## 2021-09-09 ENCOUNTER — Telehealth: Payer: Medicare HMO

## 2021-09-09 ENCOUNTER — Other Ambulatory Visit: Payer: Self-pay

## 2021-09-09 ENCOUNTER — Ambulatory Visit (INDEPENDENT_AMBULATORY_CARE_PROVIDER_SITE_OTHER): Payer: Medicare HMO

## 2021-09-09 DIAGNOSIS — I10 Essential (primary) hypertension: Secondary | ICD-10-CM

## 2021-09-09 DIAGNOSIS — Z7409 Other reduced mobility: Secondary | ICD-10-CM

## 2021-09-09 DIAGNOSIS — I639 Cerebral infarction, unspecified: Secondary | ICD-10-CM

## 2021-09-09 DIAGNOSIS — E785 Hyperlipidemia, unspecified: Secondary | ICD-10-CM

## 2021-09-09 MED ORDER — AMLODIPINE BESYLATE 10 MG PO TABS: 10.0000 mg | ORAL_TABLET | Freq: Every day | ORAL | 0 refills | Status: DC

## 2021-09-09 NOTE — Patient Instructions (Signed)
Visit Information  PATIENT GOALS:  Goals Addressed             This Visit's Progress    RNCM:Plan for Long Term Care-Stroke/mild cognitive impairment   On track    Timeframe:  Long-Range Goal Priority:  Medium Start Date:  04/07/21                           Expected End Date:      02/11/22                 Follow Up Date 10/21/21    - complete a Power of Attorney; include caregiver - check out other places when staying at home is no longer possible (assisted living center, nursing home) - check out services like in-home help or adult day care - make a list of people who can help and what they can do    Why is this important?   Having and recovering from a stroke can be scary and stressful.  You can reduce stress by planning.  Preparing for the future is one of the most important things to do.  Thinking about how much care you/your loved one will need and how much it will cost is not easy.  You/your loved one can be part of making decisions for the future.  Making sure that your/your loved one's wishes for care are known is important.     Notes:      RNCM:Prevent Falls   On track    Timeframe:  Long-Range Goal Priority:  Medium Start Date:      07/29/21                       Expected End Date:     01/11/22                  Follow Up Date 10/21/21    - always wear shoes or slippers with non-slip sole - install bathroom grab bars - keep a flashlight by the bed - make an emergency alert plan in case I fall - remove throw rugs or use nonslip pads - use a cane or walker - use nightlight in the bathroom, halls - always wear low-heeled or flat shoes or slippers with nonskid soles    Why is this important?   There may be trouble with balance and getting around. Falls can happen.    Notes:      RNCM:Track and Manage My Blood Pressure-Hypertension   Not on track    Timeframe:  Long-Range Goal Priority:  High Start Date:     04/07/21                         Expected End Date:    02/11/22                    Follow Up Date 10/21/21    - check blood pressure 3 times per week - choose a place to take my blood pressure (home, clinic or office, retail store) - write blood pressure results in a log or diary    Why is this important?   You won't feel high blood pressure, but it can still hurt your blood vessels.  High blood pressure can cause heart or kidney problems. It can also cause a stroke.  Making lifestyle changes like losing a little weight or eating less salt  will help.  Checking your blood pressure at home and at different times of the day can help to control blood pressure.  If the doctor prescribes medicine remember to take it the way the doctor ordered.  Call the office if you cannot afford the medicine or if there are questions about it.     Notes:         The patient verbalized understanding of instructions, educational materials, and care plan provided today and declined offer to receive copy of patient instructions, educational materials, and care plan.   Telephone follow up appointment with care management team member scheduled for: 10/21/21 at 3:30 PM [place], [facility], CDE Care Management Coordinator [facility] [phone], Mobile [phone]

## 2021-09-09 NOTE — Chronic Care Management (AMB) (Signed)
Chronic Care Management   CCM RN Visit Note  09/09/2021 Name: Sheila Perez MRN: [id number] DOB: 1946-06-26  Subjective: [id number] Sheila Perez is a 75 y.o. year old female who is a primary care patient of [place], Timoteo Expose, MD. The care management team was consulted for assistance with disease management and care coordination needs.    Engaged with patient by telephone for follow up visit in response to provider referral for case management and/or care coordination services.   Consent to Services:  The patient was given information about Chronic Care Management services, agreed to services, and gave verbal consent prior to initiation of services.  Please see initial visit note for detailed documentation.   Patient agreed to services and verbal consent obtained.   Assessment: Review of patient past medical history, allergies, medications, health status, including review of consultants reports, laboratory and other test data, was performed as part of comprehensive evaluation and provision of chronic care management services.   SDOH (Social Determinants of Health) assessments and interventions performed:    CCM Care Plan  No Known Allergies  Outpatient Encounter Medications as of 09/09/2021  Medication Sig   acetaminophen (TYLENOL) 500 MG tablet Take 500 mg by mouth every 6 (six) hours as needed for mild pain.   atorvastatin (LIPITOR) 20 MG tablet TAKE 1 TABLET EVERY DAY (Patient taking differently: Take 20 mg by mouth daily.)   cholecalciferol (VITAMIN D3) 25 MCG (1000 UNIT) tablet Take 1,000 Units by mouth daily.   co-enzyme Q-10 30 MG capsule Take 30 mg by mouth daily.   [DISCONTINUED] amLODipine (NORVASC) 10 MG tablet Take 1 tablet (10 mg total) by mouth daily.   No facility-administered encounter medications on file as of 09/09/2021.    Patient Active Problem List   Diagnosis Date Noted   Vitamin D deficiency, unspecified 04/18/2021   Cerebral amyloid angiopathy  (CODE) 04/18/2021   Morbid obesity (HCC) 05/19/2020   PAD (peripheral artery disease) (HCC) 05/07/2020   Urinary incontinence in female 02/27/2020   Hyperlipidemia 02/18/2020   Prediabetes 02/18/2020   CVA (cerebral vascular accident) (HCC) 02/18/2020   Hypertension, essential, benign 01/30/2020    Conditions to be addressed/monitored:HTN, HLD, and hx CVA  Care Plan : RNCM:Hypertension (Adult)  Updates made by Yetta Glassman, RN since 09/09/2021 12:00 AM     Problem: Lack of self management of Hypertension   Priority: High     Long-Range Goal: Effective self management of Hypertension   Start Date: 04/07/2021  Expected End Date: 02/11/2022  This Visit's Progress: Not on track  Recent Progress: On track  Priority: High  Note:   Objective:  Last practice recorded BP readings:  BP Readings from Last 3 Encounters:  02/09/21 (!) 196/113  01/07/21 130/80  09/08/20 130/80   Most recent eGFR/CrCl: No results found for: EGFR  No components found for: CRCL Current Barriers:  Knowledge Deficits related to basic understanding of hypertension pathophysiology and self care management  with hx of HLD, CVA, prediabetes Knowledge Deficits related to understanding of medications prescribed for management of hypertension Non-adherence to prescribed medication regimen Cognitive Deficits Does not adhere to provider recommendations re: activity, B/P monitoring Unable to perform IADLs independently Spoke with daughter Roel Cluck Release States that pt's B/P was 154/100 when the PT took it yesterday around 1 PM and it was 138/82 the day before when the OT took her reading.  States that she gave pt her last amlodipine yesterday and pt needs a refill of medication.  States she has not received it from the [facility] mail order.  States she has not gotten an arm B/P monitor yet but plans to order from the over the counter benefit from her Olivia. States pt has not had any GI upset  since she stopped the hydralazine. States she is giving the pt her medications and making sure she is eating healthy low sodium low CHO foods Case Manager Clinical Goal(s):  patient will verbalize understanding of plan for hypertension management patient will attend all scheduled medical appointments: no primary care scheduled neurology 11/17/21 patient will demonstrate improved health management independence as evidenced by checking blood pressure as directed and notifying PCP if SBP>160 or DBP > 90, taking all medications as prescribe, and adhering to a low sodium diet as discussed. Interventions:  Collaboration with [name], [name], MD regarding development and update of comprehensive plan of care as evidenced by provider attestation and co-signature-Messaged Dr.Jordan-States that she gave pt her last amlodipine yesterday and pt needs a refill of medication.  States she has not received it from the [facility] mail order. Requests refill sent to [facility] on [address]. Inter-disciplinary care team collaboration (see longitudinal plan of care) Evaluation of current treatment plan related to hypertension self management and patient's adherence to plan as established by provider. Reinforced  education to patient re: stroke prevention, s/s of heart attack and stroke, DASH diet, complications of uncontrolled blood pressure Reviewed medications with patient and discussed importance of compliance Referral to LCSW for caregiver stress and need for respite care-LCSW has provided resources Discussed plans with patient for ongoing care management follow up and provided patient with direct contact information for care management team Reinforced  to monitor blood pressure daily and record, calling PCP for findings outside established parameters.  Reviewed scheduled/upcoming provider appointments including: no primary care scheduled neurology 11/17/21 Reviewed to order arm B/P monitor from pt over the counter benefit  from her insurance Self-Care Activities: - Self administers medications as prescribed Attends all scheduled provider appointments Calls provider office for new concerns, questions, or BP outside discussed parameters Checks BP and records as discussed Follows a low sodium diet/DASH diet Patient Goals: - check blood pressure 3 times per week - choose a place to take my blood pressure (home, clinic or office, retail store) - write blood pressure results in a log or diary Follow Up Plan: Telephone follow up appointment with care management team member scheduled for: 10/21/21 at  3:30 PM The patient has been provided with contact information for the care management team and has been advised to call with any health related questions or concerns.      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  States pt went to the ED on 08/27/21 for vomiting  and increased B/P.  States [facility] is providing therapy and is has applied for help with her insurance to help care for pt.  States she has talked to the LCSW about resources available.  States she is trying to get pt to drink more fluids.  States that  the OT has recommended pt get a transfer bench to shower. States pt has not fallen other than she rolled out of bed when she was sick.  States she uses her Rolator Clinical Goal(s):  Collaboration with [name], [name], MD regarding development and update of comprehensive plan of care as evidenced by provider attestation and co-signature-Daughter is requesting order for shower transfer bench be written Inter-disciplinary care team collaboration (see longitudinal plan of care) patient will work with care management team to address care coordination and chronic disease management needs related to Disease Management Educational Needs Medication Management and Education Psychosocial Support Caregiver Stress support Dementia and Caregiver Support   Interventions:  Evaluation of current treatment plan related to HTN, HLD, and CVA  , ADL IADL limitations, Social Isolation, Cognitive Deficits, and Inability to perform IADL's independently self-management and patient's adherence to plan as established by provider. Collaboration with [name], [name], MD regarding development and update of comprehensive plan of care as evidenced by provider attestation       and co-signature Inter-disciplinary care team collaboration (see longitudinal plan of care) Discussed plans with patient for ongoing care management follow up and provided patient with direct contact information for care management team Discussed need for Advanced Directives  and has Advanced Directives  packet Referred to LCSW for caregiver stress and need for respite care-LCSW provided with caregiver on resources Reinforced fall precautions and home safety Reviewed to keep appointment with Dr. [name] no primary care scheduled neurology 11/17/21 Reinforced s/sx of UTI to call MD Self Care Activities:  Self administers medications as prescribed Attends all scheduled provider appointments Performs ADL's independently Performs IADL's  independently Calls provider office for new concerns or questions Patient Goals: - complete a Power of Attorney; include caregiver - check out other places when staying at home is no longer possible (assisted living center, nursing home) - check out services like in-home help or adult day care - make a list of people who can help and what they can do - always wear shoes or slippers with non-slip sole - install bathroom grab bars - keep a flashlight by the bed - make an emergency alert plan in case I fall - remove throw rugs or use nonslip pads - use a cane or walker - use nightlight in the bathroom, halls - always wear low-heeled or flat shoes or slippers with nonskid soles Follow Up Plan: Telephone follow up appointment with care management team member scheduled for: 10/21/21 at 3:30 PM The patient has been provided with contact information for the care management team and has been advised to call with any health related questions or concerns.       Plan:Telephone follow up appointment with care management team member scheduled for:  10/21/21 The patient has been provided with contact information for the care management team and has been advised to call with any health related questions or concerns.  Peter Garter RN, Jackquline Denmark, CDE Care Management Coordinator [facility] [phone], Mobile [phone]

## 2021-09-09 NOTE — Progress Notes (Signed)
30 day supply of Amlodipine 10 mg sent to local pharmacy.

## 2021-09-12 DIAGNOSIS — I10 Essential (primary) hypertension: Secondary | ICD-10-CM | POA: Diagnosis not present

## 2021-09-12 DIAGNOSIS — E785 Hyperlipidemia, unspecified: Secondary | ICD-10-CM

## 2021-09-12 DIAGNOSIS — I639 Cerebral infarction, unspecified: Secondary | ICD-10-CM

## 2021-09-12 NOTE — Telephone Encounter (Signed)
We just resumed Amlodipine 10 mg daily. I do not see f/u appt being arranged. Continue monitoring BP's and bring BP readings. Thanks, BJ

## 2021-09-12 NOTE — Telephone Encounter (Signed)
I tried contacting pt's daughter to have her schedule a follow up visit for next week. Mailbox is full.

## 2021-09-13 ENCOUNTER — Other Ambulatory Visit: Payer: Self-pay

## 2021-09-13 ENCOUNTER — Ambulatory Visit: Payer: Medicare HMO | Admitting: Neurology

## 2021-09-13 DIAGNOSIS — R29898 Other symptoms and signs involving the musculoskeletal system: Secondary | ICD-10-CM

## 2021-09-13 NOTE — Telephone Encounter (Signed)
I spoke with patient's daughter. Appointment made for Monday at 4pm, they will bring bp readings with them.

## 2021-09-13 NOTE — Procedures (Signed)
[facility]  [place], [place]  [place], [place] [address] Tel: [phone] Fax:  [phone] Test Date:  09/13/2021  Patient: Sheila Perez DOB: 01-23-1946 Physician: Narda Amber, DO  Sex: Female Height: 5\' 3"  Ref Phys: Metta Clines, D.O.  ID#: [id number]   Technician:    Patient Complaints: This is a 75 year old female referred for evaluation of bilateral leg weakness.  NCV & EMG Findings: This is a limited study as the patient requested to terminate testing due to pain.  Findings show normal right superficial peroneal sensory response.    Impression: This is an incomplete study, as patient requested to terminate testing prematurely due to pain.   ___________________________ Narda Amber, DO    Nerve Conduction Studies Anti Sensory Summary Table   Stim Site NR Peak (ms) Norm Peak (ms) P-T Amp (V) Norm P-T Amp  Right Sup Peroneal Anti Sensory (Ant Lat Mall)  33C  12 cm    2.8 <4.6 5.8 >3      Waveforms:

## 2021-09-14 DIAGNOSIS — F0390 Unspecified dementia without behavioral disturbance: Secondary | ICD-10-CM | POA: Diagnosis not present

## 2021-09-14 DIAGNOSIS — I739 Peripheral vascular disease, unspecified: Secondary | ICD-10-CM | POA: Diagnosis not present

## 2021-09-14 DIAGNOSIS — E559 Vitamin D deficiency, unspecified: Secondary | ICD-10-CM | POA: Diagnosis not present

## 2021-09-14 DIAGNOSIS — I10 Essential (primary) hypertension: Secondary | ICD-10-CM | POA: Diagnosis not present

## 2021-09-14 DIAGNOSIS — E785 Hyperlipidemia, unspecified: Secondary | ICD-10-CM | POA: Diagnosis not present

## 2021-09-14 DIAGNOSIS — N3000 Acute cystitis without hematuria: Secondary | ICD-10-CM | POA: Diagnosis not present

## 2021-09-14 DIAGNOSIS — R7303 Prediabetes: Secondary | ICD-10-CM | POA: Diagnosis not present

## 2021-09-14 DIAGNOSIS — K625 Hemorrhage of anus and rectum: Secondary | ICD-10-CM | POA: Diagnosis not present

## 2021-09-15 ENCOUNTER — Ambulatory Visit: Payer: Medicare HMO

## 2021-09-15 DIAGNOSIS — I10 Essential (primary) hypertension: Secondary | ICD-10-CM | POA: Diagnosis not present

## 2021-09-15 DIAGNOSIS — E559 Vitamin D deficiency, unspecified: Secondary | ICD-10-CM | POA: Diagnosis not present

## 2021-09-15 DIAGNOSIS — I739 Peripheral vascular disease, unspecified: Secondary | ICD-10-CM | POA: Diagnosis not present

## 2021-09-15 DIAGNOSIS — E785 Hyperlipidemia, unspecified: Secondary | ICD-10-CM | POA: Diagnosis not present

## 2021-09-15 DIAGNOSIS — N3000 Acute cystitis without hematuria: Secondary | ICD-10-CM | POA: Diagnosis not present

## 2021-09-15 DIAGNOSIS — F0390 Unspecified dementia without behavioral disturbance: Secondary | ICD-10-CM | POA: Diagnosis not present

## 2021-09-15 DIAGNOSIS — K625 Hemorrhage of anus and rectum: Secondary | ICD-10-CM | POA: Diagnosis not present

## 2021-09-15 DIAGNOSIS — R7303 Prediabetes: Secondary | ICD-10-CM | POA: Diagnosis not present

## 2021-09-19 ENCOUNTER — Ambulatory Visit: Payer: Medicare HMO | Admitting: Family Medicine

## 2021-09-20 DIAGNOSIS — R7303 Prediabetes: Secondary | ICD-10-CM | POA: Diagnosis not present

## 2021-09-20 DIAGNOSIS — E559 Vitamin D deficiency, unspecified: Secondary | ICD-10-CM | POA: Diagnosis not present

## 2021-09-20 DIAGNOSIS — I10 Essential (primary) hypertension: Secondary | ICD-10-CM | POA: Diagnosis not present

## 2021-09-20 DIAGNOSIS — E785 Hyperlipidemia, unspecified: Secondary | ICD-10-CM | POA: Diagnosis not present

## 2021-09-20 DIAGNOSIS — I739 Peripheral vascular disease, unspecified: Secondary | ICD-10-CM | POA: Diagnosis not present

## 2021-09-20 DIAGNOSIS — K625 Hemorrhage of anus and rectum: Secondary | ICD-10-CM | POA: Diagnosis not present

## 2021-09-20 DIAGNOSIS — N3000 Acute cystitis without hematuria: Secondary | ICD-10-CM | POA: Diagnosis not present

## 2021-09-20 DIAGNOSIS — F0390 Unspecified dementia without behavioral disturbance: Secondary | ICD-10-CM | POA: Diagnosis not present

## 2021-09-22 ENCOUNTER — Telehealth: Payer: Self-pay

## 2021-09-22 NOTE — Progress Notes (Signed)
Patient advised.

## 2021-09-22 NOTE — Telephone Encounter (Signed)
Sheila Perez called from [facility] stating request for additional home health was denied to appeal must call 09/23/21 by 12 pm  Call back # [phone] option 1

## 2021-09-23 ENCOUNTER — Ambulatory Visit: Payer: Medicare HMO | Admitting: Family Medicine

## 2021-09-23 ENCOUNTER — Telehealth: Payer: Self-pay

## 2021-09-23 DIAGNOSIS — I739 Peripheral vascular disease, unspecified: Secondary | ICD-10-CM | POA: Diagnosis not present

## 2021-09-23 DIAGNOSIS — N3000 Acute cystitis without hematuria: Secondary | ICD-10-CM | POA: Diagnosis not present

## 2021-09-23 DIAGNOSIS — I10 Essential (primary) hypertension: Secondary | ICD-10-CM | POA: Diagnosis not present

## 2021-09-23 DIAGNOSIS — R7303 Prediabetes: Secondary | ICD-10-CM | POA: Diagnosis not present

## 2021-09-23 DIAGNOSIS — E785 Hyperlipidemia, unspecified: Secondary | ICD-10-CM | POA: Diagnosis not present

## 2021-09-23 DIAGNOSIS — F0390 Unspecified dementia without behavioral disturbance: Secondary | ICD-10-CM | POA: Diagnosis not present

## 2021-09-23 DIAGNOSIS — K625 Hemorrhage of anus and rectum: Secondary | ICD-10-CM | POA: Diagnosis not present

## 2021-09-23 DIAGNOSIS — E559 Vitamin D deficiency, unspecified: Secondary | ICD-10-CM | POA: Diagnosis not present

## 2021-09-23 NOTE — Telephone Encounter (Signed)
Home health nurse called to report patient B/p reading 220-225/ 110-135 and asked what she should do and was informed patient needs to be seen at ED or UC the nurse stated she would inform the family.

## 2021-09-23 NOTE — Telephone Encounter (Signed)
I spoke with [employer]. Pt is coming on 11/21, will discuss if home health is still needed at that time.

## 2021-09-27 DIAGNOSIS — E785 Hyperlipidemia, unspecified: Secondary | ICD-10-CM | POA: Diagnosis not present

## 2021-09-27 DIAGNOSIS — E559 Vitamin D deficiency, unspecified: Secondary | ICD-10-CM | POA: Diagnosis not present

## 2021-09-27 DIAGNOSIS — R7303 Prediabetes: Secondary | ICD-10-CM | POA: Diagnosis not present

## 2021-09-27 DIAGNOSIS — K625 Hemorrhage of anus and rectum: Secondary | ICD-10-CM | POA: Diagnosis not present

## 2021-09-27 DIAGNOSIS — I10 Essential (primary) hypertension: Secondary | ICD-10-CM | POA: Diagnosis not present

## 2021-09-27 DIAGNOSIS — F0390 Unspecified dementia without behavioral disturbance: Secondary | ICD-10-CM | POA: Diagnosis not present

## 2021-09-27 DIAGNOSIS — I739 Peripheral vascular disease, unspecified: Secondary | ICD-10-CM | POA: Diagnosis not present

## 2021-09-27 DIAGNOSIS — N3000 Acute cystitis without hematuria: Secondary | ICD-10-CM | POA: Diagnosis not present

## 2021-10-03 ENCOUNTER — Ambulatory Visit: Payer: Medicare HMO | Admitting: Family Medicine

## 2021-10-03 NOTE — Progress Notes (Deleted)
    ACUTE VISIT No chief complaint on file.  HPI: [name] is a 75 y.o. female, who is here today complaining of *** HPI  Review of Systems Rest see pertinent positives and negatives per HPI.  Current Outpatient Medications on File Prior to Visit  Medication Sig Dispense Refill   acetaminophen (TYLENOL) 500 MG tablet Take 500 mg by mouth every 6 (six) hours as needed for mild pain.     amLODipine (NORVASC) 10 MG tablet Take 1 tablet (10 mg total) by mouth daily. 30 tablet 0   atorvastatin (LIPITOR) 20 MG tablet TAKE 1 TABLET EVERY DAY (Patient taking differently: Take 20 mg by mouth daily.) 90 tablet 3   cholecalciferol (VITAMIN D3) 25 MCG (1000 UNIT) tablet Take 1,000 Units by mouth daily.     co-enzyme Q-10 30 MG capsule Take 30 mg by mouth daily.     No current facility-administered medications on file prior to visit.     Past Medical History:  Diagnosis Date   Hyperlipidemia    Hypertension    No Known Allergies  Social History   Socioeconomic History   Marital status: Widowed    Spouse name: Not on file   Number of children: 2   Years of education: 10   Highest education level: 10th grade  Occupational History   Occupation: Retired  Tobacco Use   Smoking status: Never   Smokeless tobacco: Never  Vaping Use   Vaping Use: Never used  Substance and Sexual Activity   Alcohol use: No   Drug use: No   Sexual activity: Not Currently  Other Topics Concern   Not on file  Social History Narrative   Right handed   Lives daughter in a two story home but lives on first floor   Drinks no caffeine    Social Determinants of Health   Financial Resource Strain: Low Risk    Difficulty of Paying Living Expenses: Not hard at all  Food Insecurity: No Food Insecurity   Worried About Charity fundraiser in the Last Year: Never true   Arboriculturist in the Last Year: Never true  Transportation Needs: No Transportation Needs   Lack of Transportation  (Medical): No   Lack of Transportation (Non-Medical): No  Physical Activity: Inactive   Days of Exercise per Week: 0 days   Minutes of Exercise per Session: 0 min  Stress: No Stress Concern Present   Feeling of Stress : Not at all  Social Connections: Socially Isolated   Frequency of Communication with Friends and Family: More than three times a week   Frequency of Social Gatherings with Friends and Family: More than three times a week   Attends Religious Services: Never   Marine scientist or Organizations: No   Attends Archivist Meetings: Never   Marital Status: Widowed    There were no vitals filed for this visit. There is no height or weight on file to calculate BMI.  Physical Exam  ASSESSMENT AND PLAN:  There are no diagnoses linked to this encounter.   No follow-ups on file.   [name]. [name], MD  [facility] [place] office.  Discharge Instructions   None

## 2021-10-12 ENCOUNTER — Other Ambulatory Visit: Payer: Self-pay

## 2021-10-12 ENCOUNTER — Encounter: Payer: Self-pay | Admitting: Gastroenterology

## 2021-10-12 ENCOUNTER — Ambulatory Visit (AMBULATORY_SURGERY_CENTER): Payer: Medicare HMO | Admitting: *Deleted

## 2021-10-12 VITALS — Ht 63.0 in | Wt 209.0 lb

## 2021-10-12 DIAGNOSIS — Z1211 Encounter for screening for malignant neoplasm of colon: Secondary | ICD-10-CM

## 2021-10-12 MED ORDER — NA SULFATE-K SULFATE-MG SULF 17.5-3.13-1.6 GM/177ML PO SOLN: 1.0000 | Freq: Once | ORAL | 0 refills | Status: AC

## 2021-10-12 NOTE — Progress Notes (Signed)
Virtual pre visit completed over telephone with Patient's daughter.  Instructions forwarded though e-mail [email or web address]    No egg or soy allergy known to patient  No issues known to pt with past sedation with any surgeries or procedures Patient denies ever being told they had issues or difficulty with intubation  No FH of Malignant Hyperthermia Pt is not on diet pills Pt is not on  home 02  Pt is not on blood thinners  Pt denies issues with constipation  No A fib or A flutter  Pt is fully vaccinated  for Covid  Aware there is co-pay for generic suprep  Due to the COVID-19 pandemic we are asking patients to follow certain guidelines in [facility] and the [facility]   Pt aware of COVID protocols and [facility] guidelines

## 2021-10-21 ENCOUNTER — Telehealth: Payer: Medicare HMO

## 2021-10-26 ENCOUNTER — Telehealth: Payer: Self-pay | Admitting: Gastroenterology

## 2021-10-26 ENCOUNTER — Encounter: Payer: Medicare HMO | Admitting: Gastroenterology

## 2021-10-26 NOTE — Telephone Encounter (Signed)
Please remind this patient of our standard instructions to get the bowel prep at her pharmacy at least 3 days prior to the rescheduled procedure.

## 2021-10-26 NOTE — Telephone Encounter (Signed)
Good Morning Dr. Loletha Carrow,   Patients daughter called and stated that she was having to cancel the patients procedure for today at 2:00 due to pharmacy not having prep medication.  Rescheduled for 1/6 at 3:30

## 2021-11-06 ENCOUNTER — Other Ambulatory Visit: Payer: Self-pay

## 2021-11-06 ENCOUNTER — Emergency Department (HOSPITAL_COMMUNITY): Payer: Medicare HMO

## 2021-11-06 ENCOUNTER — Encounter (HOSPITAL_COMMUNITY): Payer: Self-pay

## 2021-11-06 ENCOUNTER — Emergency Department (HOSPITAL_COMMUNITY)
Admission: EM | Admit: 2021-11-06 | Discharge: 2021-11-07 | Disposition: A | Discharge status: Home / Self Care | Payer: Medicare HMO | Attending: Emergency Medicine | Admitting: Emergency Medicine

## 2021-11-06 DIAGNOSIS — R051 Acute cough: Secondary | ICD-10-CM | POA: Diagnosis not present

## 2021-11-06 DIAGNOSIS — R110 Nausea: Secondary | ICD-10-CM | POA: Insufficient documentation

## 2021-11-06 DIAGNOSIS — Z20822 Contact with and (suspected) exposure to covid-19: Secondary | ICD-10-CM | POA: Diagnosis not present

## 2021-11-06 DIAGNOSIS — I10 Essential (primary) hypertension: Secondary | ICD-10-CM | POA: Diagnosis not present

## 2021-11-06 DIAGNOSIS — T17920A Food in respiratory tract, part unspecified causing asphyxiation, initial encounter: Secondary | ICD-10-CM | POA: Diagnosis not present

## 2021-11-06 DIAGNOSIS — R059 Cough, unspecified: Secondary | ICD-10-CM | POA: Diagnosis not present

## 2021-11-06 DIAGNOSIS — R0902 Hypoxemia: Secondary | ICD-10-CM | POA: Diagnosis not present

## 2021-11-06 NOTE — ED Provider Notes (Signed)
[facility] DEPT Provider Note   CSN: [id number] Arrival date & time: 11/06/21  2232     History Chief Complaint  Patient presents with   Cough    Sheila Perez is a 75 y.o. female who presents the emergency department with coughing and just prior to arrival.  Patient states that she was trying take some cough medicine when she began laughing and shortly after started coughing and was unable to stop.  She has a foreign body sensation in her throat.  She denies any shortness of breath or chest pain.  Coughing has improved on arrival.  No fever, chills, abdominal pain, vomiting, diarrhea.  She does endorse associated nausea.   Cough     History reviewed. No pertinent past medical history.  There are no problems to display for this patient.   History reviewed. No pertinent surgical history.   OB History   No obstetric history on file.     No family history on file.  Social History   Tobacco Use   Smoking status: Unknown    Home Medications Prior to Admission medications   Not on File    Allergies    Patient has no known allergies.  Review of Systems   Review of Systems  Respiratory:  Positive for cough.   All other systems reviewed and are negative.  Physical Exam Updated Vital Signs BP (!) 164/106    Pulse 97    Temp 98.1 F (36.7 C) (Oral)    Resp 20    Ht 5\' 3"  (1.6 m)    Wt 90.7 kg    SpO2 100%    BMI 35.43 kg/m   Physical Exam Vitals and nursing note reviewed.  Constitutional:      General: She is not in acute distress.    Appearance: Normal appearance.  HENT:     Head: Normocephalic and atraumatic.  Eyes:     General:        Right eye: No discharge.        Left eye: No discharge.  Cardiovascular:     Comments: Regular rate and rhythm.  S1/S2 are distinct without any evidence of murmur, rubs, or gallops.  Radial pulses are 2+ bilaterally.  Dorsalis pedis pulses are 2+ bilaterally.  No evidence of pedal  edema. Pulmonary:     Comments: Clear to auscultation bilaterally.  Normal effort.  No respiratory distress.  No evidence of wheezes, rales, or rhonchi heard throughout. Abdominal:     General: Abdomen is flat. Bowel sounds are normal. There is no distension.     Tenderness: There is no abdominal tenderness. There is no guarding or rebound.  Musculoskeletal:        General: Normal range of motion.     Cervical back: Neck supple.  Skin:    General: Skin is warm and dry.     Findings: No rash.  Neurological:     General: No focal deficit present.     Mental Status: She is alert.  Psychiatric:        Mood and Affect: Mood normal.        Behavior: Behavior normal.    ED Results / Procedures / Treatments   Labs (all labs ordered are listed, but only abnormal results are displayed) Labs Reviewed  RESP PANEL BY RT-PCR (FLU A&B, COVID) [id number]    EKG None  Radiology DG Neck Soft Tissue  Result Date: 11/06/2021 CLINICAL DATA:  Cough.  Rule out aspiration. EXAM:  NECK SOFT TISSUES - 1+ VIEW COMPARISON:  None. FINDINGS: There is no evidence of retropharyngeal soft tissue swelling or epiglottic enlargement. The cervical airway is unremarkable and no radio-opaque foreign body identified. Degenerative changes of the spine. No acute osseous pathology. IMPRESSION: Negative. Electronically Signed   By: Anner Crete M.D.   On: 11/06/2021 23:37   DG Chest 2 View  Result Date: 11/06/2021 CLINICAL DATA:  Cough.  Rule out aspiration. EXAM: CHEST - 2 VIEW COMPARISON:  None. FINDINGS: The heart size and mediastinal contours are within normal limits. Both lungs are clear. The visualized skeletal structures are unremarkable. IMPRESSION: No active cardiopulmonary disease. Electronically Signed   By: Anner Crete M.D.   On: 11/06/2021 23:33    Procedures Procedures   Medications Ordered in ED Medications - No data to display  ED Course  I have reviewed the triage vital signs and the nursing  notes.  Pertinent labs & imaging results that were available during my care of the patient were reviewed by me and considered in my medical decision making (see chart for details).    MDM Rules/Calculators/A&P                          Sheila Perez is a 75 y.o. female who presents to the emergency department with coughing.  I have a low suspicion for any emergent causes of her coughing at this time given the mechanism.  This is likely a laryngeal spasm from her taking the medication and choking.  Respiratory panel was ordered prior to me seeing the patient.  This is currently in process.  I do not think this is an infectious etiology at this time.  Imaging of the neck in chest were negative.  No obvious foreign bodies were seen.  No pneumonia, no pneumothorax.  Family was at bedside.  We discussed the findings together.  All questions and concerns addressed.  I will have a look at the results of her respiratory panel on MyChart.  Strict turn precautions given.  She is safe for discharge.     Final Clinical Impression(s) / ED Diagnoses Final diagnoses:  Acute cough    Rx / DC Orders ED Discharge Orders     None        Hendricks Limes, Hershal Coria 11/07/21 0002    Drenda Freeze, MD 11/09/21 [phone]

## 2021-11-06 NOTE — ED Triage Notes (Signed)
Pt arrives EMS from home after laughing until she coughed and then continued coughing for several minutes.

## 2021-11-07 LAB — RESP PANEL BY RT-PCR (FLU A&B, COVID) ARPGX2
Influenza A by PCR: NEGATIVE
Influenza B by PCR: NEGATIVE
SARS Coronavirus 2 by RT PCR: NEGATIVE

## 2021-11-07 NOTE — Discharge Instructions (Signed)
I believe this is likely a laryngeal spasm.  This will resolve on its own.  Please drink plenty of fluids.  You can see the results of your COVID and influenza swab on MyChart.  Please follow-up with your primary care provider for further evaluation.  Please return to the emergency department sooner if you experience worsening cough, trouble breathing, severe abdominal pain, vomiting, fevers, or other concerns you might have.

## 2021-11-08 ENCOUNTER — Ambulatory Visit (INDEPENDENT_AMBULATORY_CARE_PROVIDER_SITE_OTHER): Payer: Medicare HMO

## 2021-11-08 DIAGNOSIS — I680 Cerebral amyloid angiopathy: Secondary | ICD-10-CM

## 2021-11-08 DIAGNOSIS — I639 Cerebral infarction, unspecified: Secondary | ICD-10-CM

## 2021-11-08 DIAGNOSIS — I10 Essential (primary) hypertension: Secondary | ICD-10-CM

## 2021-11-08 DIAGNOSIS — E785 Hyperlipidemia, unspecified: Secondary | ICD-10-CM

## 2021-11-08 NOTE — Patient Instructions (Signed)
Visit Information  Thank you for taking time to visit with me today. Please don't hesitate to contact me if I can be of assistance to you before our next scheduled telephone appointment.  Following are the goals we discussed today:  Take all medications as prescribed Attend all scheduled provider appointments Call pharmacy for medication refills 3-7 days in advance of running out of medications Call provider office for new concerns or questions  check blood pressure daily choose a place to take my blood pressure (home, clinic or office, retail store) take blood pressure log to all doctor appointments call doctor for signs and symptoms of high blood pressure keep all doctor appointments eat more whole grains, fruits and vegetables, lean meats and healthy fats limit salt intake to 200mg /day call for medicine refill 2 or 3 days before it runs out take all medications exactly as prescribed call doctor with any symptoms you believe are related to your medicine  Our next appointment is by telephone on 01/05/22 at 3PM  Please call the care guide team at [phone] if you need to cancel or reschedule your appointment.   If you are experiencing a Mental Health or [place] or need someone to talk to, please call the Suicide and Crisis Lifeline: 988 call the [place] National Suicide Prevention Lifeline: (340)423-6923 or TTY: (956) 451-1164 TTY 831-041-9665) to talk to a trained counselor call 1-800-273-TALK (toll free, 24 hour hotline) go to [facility] [place] [phone]) call 911   The patient verbalized understanding of instructions, educational materials, and care plan provided today and declined offer to receive copy of patient instructions, educational materials, and care plan.   Peter Garter RN, Jackquline Denmark, CDE Care Management Coordinator [facility] [phone], Mobile [phone]

## 2021-11-08 NOTE — Chronic Care Management (AMB) (Signed)
Chronic Care Management   CCM RN Visit Note  11/08/2021 Name: Sheila Perez MRN: [id number] DOB: October 16, 1946  Subjective: [name] is a 75 y.o. year old female who is a primary care patient of [name], Sheila So, MD. The care management team was consulted for assistance with disease management and care coordination needs.    Engaged with patient by telephone for follow up visit in response to provider referral for case management and/or care coordination services.   Consent to Services:  The patient was given information about Chronic Care Management services, agreed to services, and gave verbal consent prior to initiation of services.  Please see initial visit note for detailed documentation.   Patient agreed to services and verbal consent obtained.   Assessment: Review of patient past medical history, allergies, medications, health status, including review of consultants reports, laboratory and other test data, was performed as part of comprehensive evaluation and provision of chronic care management services.   SDOH (Social Determinants of Health) assessments and interventions performed:    CCM Care Plan  No Known Allergies  Outpatient Encounter Medications as of 11/08/2021  Medication Sig   acetaminophen (TYLENOL) 500 MG tablet Take 500 mg by mouth every 6 (six) hours as needed for mild pain.   amLODipine (NORVASC) 10 MG tablet Take 1 tablet (10 mg total) by mouth daily.   atorvastatin (LIPITOR) 20 MG tablet TAKE 1 TABLET EVERY DAY (Patient taking differently: Take 20 mg by mouth daily.)   cholecalciferol (VITAMIN D3) 25 MCG (1000 UNIT) tablet Take 1,000 Units by mouth daily.   co-enzyme Q-10 30 MG capsule Take 30 mg by mouth daily.   No facility-administered encounter medications on file as of 11/08/2021.    Patient Active Problem List   Diagnosis Date Noted   Vitamin D deficiency, unspecified 04/18/2021   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CODE)  04/18/2021   Morbid obesity ([place]) 05/19/2020   PAD (peripheral artery disease) ([place]) 05/07/2020   Urinary incontinence in female 02/27/2020   Hyperlipidemia 02/18/2020   Prediabetes 02/18/2020   CVA (cerebral vascular accident) ([place]) 02/18/2020   Hypertension, essential, benign 01/30/2020    Conditions to be addressed/monitored:HTN, HLD, Dementia, and hx CVA  Care Plan : RNCM:Hypertension (Adult)  Updates made by Sheila Ped, RN since 11/08/2021 12:00 AM  Completed 11/08/2021   Problem: Lack of self management of Hypertension Resolved 11/08/2021  Priority: High     Long-Range Goal: Effective self management of Hypertension Completed 11/08/2021  Start Date: 04/07/2021  Expected End Date: 02/11/2022  Recent Progress: Not on track  Priority: High  Note:   Resolving due to duplicate goal  Objective:  Last practice recorded BP readings:  BP Readings from Last 3 Encounters:  02/09/21 (!) 196/113  01/07/21 130/80  09/08/20 130/80   Most recent eGFR/CrCl: No results found for: EGFR  No components found for: CRCL Current Barriers:  Knowledge Deficits related to basic understanding of hypertension pathophysiology and self care management  with hx of HLD, CVA, prediabetes Knowledge Deficits related to understanding of medications prescribed for management of hypertension Non-adherence to prescribed medication regimen Cognitive Deficits Does not adhere to provider recommendations re: activity, B/P monitoring Unable to perform IADLs independently Spoke with daughter Sheila Perez Release States that pt's B/P was 154/100 when the PT took it yesterday around 1 PM and it was 138/82 the day before when the OT took her reading.  States that she gave pt her last amlodipine yesterday and pt needs a refill  of medication.  States she has not received it from the [facility] mail order.  States she has not gotten an arm B/P monitor yet but plans to order from the over the  counter benefit from her [place]. States pt has not had any GI upset since she stopped the hydralazine. States she is giving the pt her medications and making sure she is eating healthy low sodium low CHO foods Case Manager Clinical Goal(s):  patient will verbalize understanding of plan for hypertension management patient will attend all scheduled medical appointments: no primary care scheduled neurology 11/17/21 patient will demonstrate improved health management independence as evidenced by checking blood pressure as directed and notifying PCP if SBP>160 or DBP > 90, taking all medications as prescribe, and adhering to a low sodium diet as discussed. Interventions:  Collaboration with [name], [name], MD regarding development and update of comprehensive plan of care as evidenced by provider attestation and co-signature-Messaged Dr.Jordan-States that she gave pt her last amlodipine yesterday and pt needs a refill of medication.  States she has not received it from the [facility] mail order. Requests refill sent to [facility] on [address]. Inter-disciplinary care team collaboration (see longitudinal plan of care) Evaluation of current treatment plan related to hypertension self management and patient's adherence to plan as established by provider. Reinforced  education to patient re: stroke prevention, s/s of heart attack and stroke, DASH diet, complications of uncontrolled blood pressure Reviewed medications with patient and discussed importance of compliance Referral to LCSW for caregiver stress and need for respite care-LCSW has provided resources Discussed plans with patient for ongoing care management follow up and provided patient with direct contact information for care management team Reinforced  to monitor blood pressure daily and record, calling PCP for findings outside established parameters.  Reviewed scheduled/upcoming provider appointments including: no primary care scheduled neurology  11/17/21 Reviewed to order arm B/P monitor from pt over the counter benefit from her insurance Self-Care Activities: - Self administers medications as prescribed Attends all scheduled provider appointments Calls provider office for new concerns, questions, or BP outside discussed parameters Checks BP and records as discussed Follows a low sodium diet/DASH diet Patient Goals: - check blood pressure 3 times per week - choose a place to take my blood pressure (home, clinic or office, retail store) - write blood pressure results in a log or diary Follow Up Plan: Telephone follow up appointment with care management team member scheduled for: 10/21/21 at  3:30 PM The patient has been provided with contact information for the care management team and has been advised to call with any health related questions or concerns.      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  States pt went to  the ED on 08/27/21 for vomiting and increased B/P.  States [facility] is providing therapy and is has applied for help with her insurance to help care for  pt.  States she has talked to the LCSW about resources available.  States she is trying to get pt to drink more fluids.  States that the OT has recommended pt get a transfer bench to shower. States pt has not fallen other than she rolled out of bed when she was sick.  States she uses her Rolator Clinical Goal(s):  Collaboration with [name], [name], MD regarding development and update of comprehensive plan of care as evidenced by provider attestation and co-signature-Daughter is requesting order for shower transfer bench be written Inter-disciplinary care team collaboration (see longitudinal plan of care) patient will work with care management team to address care coordination and chronic disease management needs related to Disease Management Educational Needs Medication Management and Education Psychosocial Support Caregiver Stress support Dementia and Caregiver Support   Interventions:  Evaluation of current treatment plan related to HTN, HLD, and CVA  , ADL IADL limitations, Social Isolation, Cognitive Deficits, and Inability to perform IADL's independently self-management and patient's adherence to plan as established by provider. Collaboration with [name], [name], MD regarding development and update of comprehensive plan of care as evidenced by provider attestation       and co-signature Inter-disciplinary care team collaboration (see longitudinal plan of care) Discussed plans with patient for ongoing care management follow up and provided patient with direct contact information for care management team Discussed need for Advanced Directives  and has Advanced Directives  packet Referred to LCSW for caregiver stress and need for respite care-LCSW provided with caregiver on resources Reinforced fall precautions and home safety Reviewed to keep appointment with Dr. [name] no primary care scheduled neurology 11/17/21 Reinforced s/sx of UTI to call MD Self Care Activities:  Self administers  medications as prescribed Attends all scheduled provider appointments Performs ADL's independently Performs IADL's independently Calls provider office for new concerns or questions Patient Goals: - complete a Power of Attorney; include caregiver - check out other places when staying at home is no longer possible (assisted living center, nursing home) - check out services like in-home help or adult day care - make a list of people who can help and what they can do - always wear shoes or slippers with non-slip sole - install bathroom grab bars - keep a flashlight by the bed - make an emergency alert plan in case I fall - remove throw rugs or use nonslip pads - use a cane or walker - use nightlight in the bathroom, halls - always wear low-heeled or flat shoes or slippers with nonskid soles Follow Up Plan: Telephone follow up appointment with care management team member scheduled for: 10/21/21 at 3:30 PM The patient has been provided with contact information for the care management team and has been advised to call with any health related questions or concerns.      Care Plan : RN Care Manager Plan of Care  Updates made by Sheila Ped, RN since 11/08/2021 12:00 AM     Problem: Chronic Disease Management and Care Coordination Needs (HTN, hx CVA, memory issues and HLD)   Priority: High     Long-Range Goal: Establish Plan of Care for Chronic Disease Management Needs (HTN, hx CVA, memory issues and HLD)   Start Date: 11/08/2021  Expected End Date: 05/07/2022  Priority: High  Note:   Current  Barriers:  Care Coordination needs related to Level of care concerns, ADL IADL limitations, Memory Deficits, Inability to perform ADL's independently, and Inability to perform IADL's independently Chronic Disease Management support and education needs related to HTN, HLD, Dementia, and hx CVA  Spoke with daughter [name] [employer] Release States that pt's B/P has been up and down  and last reading was 160/90.  States she is giving the pt her medications and making sure she is eating healthy low sodium low CHO foods.  States she is trying to give her more plant based foods and raw foods.  States pt does not move around much and she tries to encourage her to move more. Denies any falls and states she is using her Rolator. States she has been giving pt more fluids.  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    RNCM Clinical Goal(s):  Patient will verbalize understanding of plan for management of HTN, HLD, Dementia, and hx CVA  as evidenced by voiced adherence to plan of care verbalize basic understanding of  HTN, HLD, Dementia, and hx CVA  disease process and self health management plan as evidenced by voiced understanding and teach back take all medications exactly as prescribed and will call provider for medication related questions as evidenced by dispense report and pt verbalization attend all scheduled medical appointments: Neurology 11/17/21, colonoscopy 11/18/21 as evidenced by medical records demonstrate Improved adherence to prescribed treatment plan for HTN, HLD, Dementia, and hx CVA  as evidenced by readings within limits, voiced adherence to plan of care continue to work with RN Care Manager to address care management and care coordination needs related to  HTN, HLD, Dementia, and hx CVA  as evidenced by adherence to CM Team Scheduled appointments through collaboration with RN Care manager, provider, and care team.   Interventions: 1:1 collaboration with primary care provider regarding development and update of comprehensive plan of care as evidenced by provider attestation and co-signature Inter-disciplinary care team collaboration (see longitudinal plan of care) Evaluation of current treatment plan related to  self management and patient's adherence to plan as established by provider   Hyperlipidemia Interventions:  (Status:   Goal on track:  Yes.) Long Term Goal Medication review performed; medication list updated in electronic medical record.  Counseled on importance of regular laboratory monitoring as prescribed Reviewed role and benefits of statin for ASCVD risk reduction Reviewed importance of limiting foods high in cholesterol  Hypertension Interventions:  (Status:  Goal on track:  NO.) Long Term Goal Last practice recorded BP readings:  BP Readings from Last 3 Encounters:  11/07/21 (!) 160/103  08/29/21 (!) 150/100  08/27/21 (!) 172/86  Most recent eGFR/CrCl: No results found for: EGFR  No components found for: CRCL  Evaluation of current treatment plan related to hypertension self management and patient's adherence to plan as established by provider Provided education to patient re: stroke prevention, s/s of heart attack and stroke Reviewed medications with patient and discussed importance of compliance Discussed plans with patient for ongoing care management follow up and provided patient with direct contact information for care management team Advised patient, providing education and rationale, to monitor blood pressure daily and record, calling PCP for findings outside established parameters Provided education on prescribed diet low sodium Reviewed to call provider for high B/P readings  Dementia:  (Status:  Goal on track:  Yes.)  Long Term Goal Evaluation of current treatment plan related to  misuse of: Vascular/Multi-infarct dementia, mild Consideration of in-home help encouraged  and Reviewed to call pt's new insurance to get list of approved care providers    Stroke:  (Status:Goal on track:  Yes.) Long Term Goal Reviewed Importance of taking all medications as prescribed Reviewed Importance of attending all scheduled provider appointments Advised to report any changes in symptoms or exercise tolerance Assessed for signs and symptoms of stroke Assessed for management of bladder and/or bowel  incontinence Reviewed the importance of exercise Assessed for fall status and safety in the home Reinforced fall prevention and home safety  Patient Goals/Self-Care Activities: Take all medications as prescribed Attend all scheduled provider appointments Call pharmacy for medication refills 3-7 days in advance of running out of medications Call provider office for new concerns or questions  check blood pressure daily choose a place to take my blood pressure (home, clinic or office, retail store) take blood pressure log to all doctor appointments call doctor for signs and symptoms of high blood pressure keep all doctor appointments eat more whole grains, fruits and vegetables, lean meats and healthy fats limit salt intake to 235m/day call for medicine refill 2 or 3 days before it runs out take all medications exactly as prescribed call doctor with any symptoms you believe are related to your medicine  Follow Up Plan:  Telephone follow up appointment with care management team member scheduled for:  01/05/22 The patient has been provided with contact information for the care management team and has been advised to call with any health related questions or concerns.       Plan:Telephone follow up appointment with care management team member scheduled for:  01/05/22 The patient has been provided with contact information for the care management team and has been advised to call with any health related questions or concerns.  MPeter GarterRN, BJackquline Denmark CDE Care Management Coordinator [facility] [phone] Mobile [phone]

## 2021-11-12 DIAGNOSIS — I10 Essential (primary) hypertension: Secondary | ICD-10-CM

## 2021-11-12 DIAGNOSIS — E785 Hyperlipidemia, unspecified: Secondary | ICD-10-CM

## 2021-11-12 DIAGNOSIS — I639 Cerebral infarction, unspecified: Secondary | ICD-10-CM

## 2021-11-16 ENCOUNTER — Encounter: Payer: Self-pay | Admitting: Gastroenterology

## 2021-11-16 NOTE — Progress Notes (Deleted)
NEUROLOGY FOLLOW UP OFFICE NOTE  Sheila Perez [id number]  Assessment/Plan:   1.  Acute on chronic lower extremity weakness - She has longstanding history of weakness.  She did not have requested testing performed, so a diagnosis cannot be made.  But I believe that her worsening weakness was secondary to UTI and deconditioning. 2.  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  Involvement of deep gray matter, brainstem and cerebellum typically seen with hypertension.  There appears more abundant microhemorrhages in the cerebral white matter and cortex, which may be seen in cerebral amyloid angiopathy.  Also, blood pressure over the past year has been relatively controlled, so I cannot attribute this increase to blood pressure. 3.  Cerebrovascular disease 4.  Vascular-related mild neurocognitive disorder 5.  New onset left-sided headache over 20 years old - sed rate was normal but CRP was elevated -Patient and daughter declined temporal artery biopsy.  However, repeat CRP normal and headache resolved.   Will check CK followed by NCV-EMG of lower extremities Follow up after testing for formal exam and next steps Continue physical therapy  Subjective:  Sheila Perez is a 76 year old right-handed black female with HTN and prediabetes who follows up for cerebral amyloid angiopathy and left-sided headache  She is accompanied by her daughter who also supplements history.   UPDATE: In June, she went out of the country to [place] in June for 2 months.  She was sedentary during her time there and only went outside once.  When she came back she had a UTI.  Blood pressure has been elevated.  Since coming back, she has had difficulty moving her legs.  Legs start to shake when she is walking and trying to initiate steps.  She is now unable to stand unassisted and  now relies on walker.  No numbness or pain in the legs or back, however she has transient back pain upon rising after prolonged sitting.  She chokes on food when eating.  No double vision.  Denies tremors in hands.  No facial droop or slurred speech.  CK was ordered, which was not performed.  She had a NCV-EMG scheduled on 09/13/2021 which was not completed at patient's request due to discomfort (only nerve conduction of right superficial peroneal sensory response was performed, which was normal) and therefore of no clinical value.  ***   HISTORY: She had an episode of difficulty forming words on 01/25/2020 for which she went to the ED.  CT and MRI of brain were negative for acute abnormality but did demonstrated significant chronic small vessel ischemic changes.  She was diagnosed with right-sided Bell's palsy and discharged on anti-viral and a prednisone taper.  Slurred speech has resolved gradually over the next week.  Advised to start ASA 81mg  daily.     Of note, she reports 5 year history of weakness in both legs.  No back pain or pain in legs.  No lower extremity numbness.  She has fallen once.  She reportedly is ordered for vascular studies.  She takes atorvastatin which has only recently been started.  She has never before been on a statin.  Again, no trouble swallowing.  Denies double vision.  No bowel or bladder retention.  Her daughter states that she has a sedentary lifestyle.  Patellar reflexes were brisk.  MRI cervical spine was ordered but never performed.  CK  was 86.   She was seen by her PCP in February 2022 for new onset left-sided headache with vomiting.  No prior history of headache.  Sed rate on 01/07/2021 was 38 with elevated CRP of 16.1. Repeat CRP on 01/28/2021 was 19.5.  CT head on 01/07/2021 personally reviewed showed 4 mm hyperdensity within the left temporal lobe.  Follow up MRI of brain with and without contrast on 01/12/2021 personally reviewed was limited due to motion.  Moderate to  advanced chronic small vessel ischemic changes in cerebral white matter and pons noted as well as innumerable chronic microhemorrhages involving the the subcortical white matter, deep gray nuclei, brainstem and cerebellum suggestive of chronic hypertension, cerebral amyloid angiopathy or both.  She was advised to discontinue ASA, NSAIDs and fish oil.  Due to motion artifact, uncertain to identify finding on previous CT, so repeat CT head performed on 01/21/2021 personally reviewed showed unchanged punctate hyperdensity in left temporal lobe, thought to be calcification.  Headache resolved after 3 days .  She may have a brief headache once in a while since then.  Denies memory deficits.  Blood pressure elevated today.  She reports that she has not been taking her blood pressure medication.  She was referred for left temporal artery biopsy but patient refused the biopsy.  However, headaches reportedly improved.  Repeat CRP in June 2022 was 1.5.    01/26/2020 CT HEAD WO:  1. No acute intracranial findings. 2. Periventricular white matter and corona radiata hypodensities favor chronic ischemic microvascular white matter disease. 3. 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 4. Mildly irregular 1.7 by 1.9 cm lucent lesion along the hard palate, with possibilities including nasopalatine duct cyst, periapical granuloma, schwannoma, or malignancy such as adenoid cystic carcinoma. There is a note in the patient's surgical history of prior maxillofacial surgery, if this was in the vicinity of the maxilla/hard palate then correlation with that operation would be recommended. If the patient's prior surgery is not connected to this lesion, then maxillofacial MRI with and without contrast with attention to this hard palate mass would be recommended.  5. Chronic bilateral maxillary sinusitis.   01/26/2020 MRI BRAIN WO:  Brain: No acute infarct. Increased diffusion signal at the right basal ganglia is at the  level of prior infarction and hemosiderin staining. Remote lacunar infarcts at the deep gray nuclei, brainstem, and right middle cerebellar peduncle. Confluent ischemic gliosis in the deep cerebral white matter. No acute hemorrhage.  Chronic blood products are seen along the bilateral posterior cerebral cortex, which may be posttraumatic. Remote micro hemorrhages at the basal ganglia and left cerebellum, likely from small vessel disease. Cerebral volume is overall normal; there is some atrophy of the brainstem which is likely post ischemic. No masslike finding or extra-axial collection.  Vascular: Preserved flow voids.  Skull and upper cervical spine: No evidence of bone lesion.  Sinuses/Orbits: No acute finding  PAST MEDICAL HISTORY: Past Medical History:  Diagnosis Date   Gait abnormality    Hyperlipidemia    Hypertension    Stroke [facility])     MEDICATIONS: Current Outpatient Medications on File Prior to Visit  Medication Sig Dispense Refill   acetaminophen (TYLENOL) 500 MG tablet Take 500 mg by mouth every 6 (six) hours as needed for mild pain.     amLODipine (NORVASC) 10 MG tablet Take 1 tablet (10 mg total) by mouth daily. 30 tablet 0   atorvastatin (LIPITOR) 20 MG tablet TAKE 1 TABLET EVERY DAY (Patient taking differently:  Take 20 mg by mouth daily.) 90 tablet 3   cholecalciferol (VITAMIN D3) 25 MCG (1000 UNIT) tablet Take 1,000 Units by mouth daily.     co-enzyme Q-10 30 MG capsule Take 30 mg by mouth daily.     No current facility-administered medications on file prior to visit.    ALLERGIES: No Known Allergies  FAMILY HISTORY: Family History  Problem Relation Age of Onset   Kidney disease Mother    Diabetes Mother    Alcohol abuse Father    Diabetes Sister    Diabetes Sister    Diabetes Daughter    Colon cancer Neg Hx    Colon polyps Neg Hx       Objective:  *** General: No acute distress.  Patient appears ***-groomed.   Head:  Normocephalic/atraumatic Eyes:  Fundi  examined but not visualized Neck: supple, no paraspinal tenderness, full range of motion Heart:  Regular rate and rhythm Lungs:  Clear to auscultation bilaterally Back: No paraspinal tenderness Neurological Exam: alert and oriented to person, place, and time.  Speech fluent and not dysarthric, language intact.  CN II-XII intact. Bulk and tone normal, muscle strength 5/5 throughout.  Sensation to light touch intact.  Deep tendon reflexes 2+ throughout, toes downgoing.  Finger to nose testing intact.  Gait normal, Romberg negative.   Metta Clines, DO  CC: ***

## 2021-11-17 ENCOUNTER — Telehealth: Payer: Medicare Other | Admitting: Neurology

## 2021-11-17 ENCOUNTER — Telehealth: Payer: Self-pay

## 2021-11-17 NOTE — Telephone Encounter (Signed)
Sheila Perez,  thank you for the note. Of note, this patient was a same-day cancel on 10/26/2021 because she had not picked up her prep. That makes 2 short notice cancellations The upcoming colonoscopy slot will be her third and final chance.  H. Danis _________________________  Dr. [name],  FYI on your patient

## 2021-11-17 NOTE — Telephone Encounter (Signed)
Hi Dr. Loletha Carrow,  This patient had to cancel her procedure tomorrow due to not having transportation. She rescheduled to 1/25.

## 2021-11-18 ENCOUNTER — Encounter: Payer: Medicare HMO | Admitting: Gastroenterology

## 2021-11-24 NOTE — Progress Notes (Signed)
Virtual Visit via Video Note The purpose of this virtual visit is to provide medical care while limiting exposure to the novel coronavirus.    Consent was obtained for video visit:  Yes.   Answered questions that patient had about telehealth interaction:  Yes.   I discussed the limitations, risks, security and privacy concerns of performing an evaluation and management service by telemedicine. I also discussed with the patient that there may be a patient responsible charge related to this service. The patient expressed understanding and agreed to proceed.  Pt location: Home Physician Location: office Name of referring provider:  [name], [name], MD I connected with Roel Cluck and family at daughter's and granddaughter's initiation/request on 11/28/2021 at  1:30 PM EST by video enabled telemedicine application and verified that I am speaking with the correct person using two identifiers. Pt MRN:  [id number] Pt DOB:  1946-01-14 Video Participants:  [place];  daughter, granddaughter  Assessment and Plan:   1.  Acute on chronic lower extremity weakness - She has longstanding history of weakness.  She did not have requested testing performed, so a diagnosis cannot be made.  But I believe that her worsening weakness was secondary to UTI and deconditioning. 2.  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  Involvement of deep gray matter, brainstem and cerebellum typically seen with hypertension.  There appears more abundant microhemorrhages in the cerebral white matter and cortex, which may be seen in cerebral amyloid angiopathy.  Also, blood pressure over the past year has been relatively controlled, so I cannot attribute this increase to blood pressure. 3.  Cerebrovascular disease 4.  Vascular-related mild neurocognitive disorder     Management of stroke risk factors (blood pressure, hyperlipidemia, glycemic control) as per PCP, but would avoid antiplatelet therapy due to cerebral amyloid angiopathy Increase physical activity/exercise Mediterranean diet Follow up in 6 months with Sharene Butters, PA-C  History of Present Illness:  Sheila Perez is a 76 year old right-handed black female with HTN and prediabetes who follows up for cerebral amyloid angiopathy and left-sided headache  She is accompanied by her daughter who also supplements history.   UPDATE: In June, she went out of the country to [place] in June for 2 months.  She was sedentary during her time there and only went outside once.  When she came back she had a UTI.  Blood pressure has been elevated.  Since coming back, she has had difficulty moving her legs.  Legs start to shake when she is walking and trying to initiate steps.  She is now unable to stand unassisted and now relies on walker.  No numbness or pain in the legs or back, however she has transient back pain upon rising after prolonged sitting.  She chokes on food when eating.  No double vision.  Denies tremors in hands.  No facial droop or slurred speech.  CK was ordered, which was not performed.  She had a NCV-EMG scheduled on 09/13/2021 which was not completed at patient's request due to discomfort (only nerve conduction of right superficial peroneal sensory response was performed, which was normal) and therefore of no clinical value.    HISTORY: She had an episode of difficulty forming words on 01/25/2020 for which she went to the ED.  CT and MRI of brain were negative for acute abnormality but did demonstrated significant chronic  small vessel ischemic changes.  She was diagnosed with right-sided Bell's palsy and discharged on anti-viral and a prednisone taper.  Slurred speech has resolved gradually over the next week.  Advised to start ASA 81mg  daily.     Of note, she reports 5 year history of weakness  in both legs.  No back pain or pain in legs.  No lower extremity numbness.  She has fallen once.  She reportedly is ordered for vascular studies.  She takes atorvastatin which has only recently been started.  She has never before been on a statin.  Again, no trouble swallowing.  Denies double vision.  No bowel or bladder retention.  Her daughter states that she has a sedentary lifestyle.  Patellar reflexes were brisk.  MRI cervical spine was ordered but never performed.  CK was 86.   She was seen by her PCP in February 2022 for new onset left-sided headache with vomiting.  No prior history of headache.  Sed rate on 01/07/2021 was 38 with elevated CRP of 16.1. Repeat CRP on 01/28/2021 was 19.5.  CT head on 01/07/2021 personally reviewed showed 4 mm hyperdensity within the left temporal lobe.  Follow up MRI of brain with and without contrast on 01/12/2021 personally reviewed was limited due to motion.  Moderate to advanced chronic small vessel ischemic changes in cerebral white matter and pons noted as well as innumerable chronic microhemorrhages involving the the subcortical white matter, deep gray nuclei, brainstem and cerebellum suggestive of chronic hypertension, cerebral amyloid angiopathy or both.  She was advised to discontinue ASA, NSAIDs and fish oil.  Due to motion artifact, uncertain to identify finding on previous CT, so repeat CT head performed on 01/21/2021 personally reviewed showed unchanged punctate hyperdensity in left temporal lobe, thought to be calcification.  Headache resolved after 3 days .  She may have a brief headache once in a while since then.  Denies memory deficits.  Blood pressure elevated today.  She reports that she has not been taking her blood pressure medication.  She was referred for left temporal artery biopsy but patient refused the biopsy.  However, headaches reportedly improved.  Repeat CRP in June 2022 was 1.5.    01/26/2020 CT HEAD WO:  1. No acute intracranial findings. 2.  Periventricular white matter and corona radiata hypodensities favor chronic ischemic microvascular white matter disease. 3. 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 4. Mildly irregular 1.7 by 1.9 cm lucent lesion along the hard palate, with possibilities including nasopalatine duct cyst, periapical granuloma, schwannoma, or malignancy such as adenoid cystic carcinoma. There is a note in the patient's surgical history of prior maxillofacial surgery, if this was in the vicinity of the maxilla/hard palate then correlation with that operation would be recommended. If the patient's prior surgery is not connected to this lesion, then maxillofacial MRI with and without contrast with attention to this hard palate mass would be recommended.  5. Chronic bilateral maxillary sinusitis.   01/26/2020 MRI BRAIN WO:  Brain: No acute infarct. Increased diffusion signal at the right basal ganglia is at the level of prior infarction and hemosiderin staining. Remote lacunar infarcts at the deep gray nuclei, brainstem, and right middle cerebellar peduncle. Confluent ischemic gliosis in the deep cerebral white matter. No acute hemorrhage.  Chronic blood products are seen along the bilateral posterior cerebral cortex, which may be posttraumatic. Remote micro hemorrhages at the basal ganglia and left cerebellum, likely from small vessel disease. Cerebral volume is overall normal; there is  some atrophy of the brainstem which is likely post ischemic. No masslike finding or extra-axial collection.  Vascular: Preserved flow voids.  Skull and upper cervical spine: No evidence of bone lesion.  Sinuses/Orbits: No acute finding  Past Medical History: Past Medical History:  Diagnosis Date   Gait abnormality    Hyperlipidemia    Hypertension    Stroke [facility])     Medications: Outpatient Encounter Medications as of 11/28/2021  Medication Sig   acetaminophen (TYLENOL) 500 MG tablet Take 500 mg by mouth every 6 (six) hours  as needed for mild pain.   amLODipine (NORVASC) 10 MG tablet Take 1 tablet (10 mg total) by mouth daily.   atorvastatin (LIPITOR) 20 MG tablet TAKE 1 TABLET EVERY DAY (Patient taking differently: Take 20 mg by mouth daily.)   cholecalciferol (VITAMIN D3) 25 MCG (1000 UNIT) tablet Take 1,000 Units by mouth daily.   co-enzyme Q-10 30 MG capsule Take 30 mg by mouth daily.   No facility-administered encounter medications on file as of 11/28/2021.    Allergies: No Known Allergies  Family History: Family History  Problem Relation Age of Onset   Kidney disease Mother    Diabetes Mother    Alcohol abuse Father    Diabetes Sister    Diabetes Sister    Diabetes Daughter    Colon cancer Neg Hx    Colon polyps Neg Hx     Observations/Objective:   There were no vitals taken for this visit.    Follow Up Instructions:    -I discussed the assessment and treatment plan with the patient. The patient was provided an opportunity to ask questions and all were answered. The patient agreed with the plan and demonstrated an understanding of the instructions.   The patient was advised to call back or seek an in-person evaluation if the symptoms worsen or if the condition fails to improve as anticipated.    Dudley Major, DO

## 2021-11-25 ENCOUNTER — Ambulatory Visit (AMBULATORY_SURGERY_CENTER): Payer: Medicare Other | Admitting: *Deleted

## 2021-11-25 ENCOUNTER — Other Ambulatory Visit: Payer: Self-pay

## 2021-11-25 ENCOUNTER — Encounter: Payer: Self-pay | Admitting: Gastroenterology

## 2021-11-25 ENCOUNTER — Telehealth: Payer: Self-pay | Admitting: *Deleted

## 2021-11-25 VITALS — Ht 63.0 in | Wt 209.0 lb

## 2021-11-25 DIAGNOSIS — Z1211 Encounter for screening for malignant neoplasm of colon: Secondary | ICD-10-CM

## 2021-11-25 NOTE — Telephone Encounter (Signed)
Was able to complete pre-visit.

## 2021-11-25 NOTE — Telephone Encounter (Signed)
Placed call to do pre-visit message left to call back to rescheduled,called back and daughter answered phone and stated she has to work in the [employer] stated she is at

## 2021-11-25 NOTE — Progress Notes (Signed)
No egg or soy allergy known to patient  No issues known to pt with past sedation with any surgeries or procedures Patient denies ever being told they had issues or difficulty with intubation  No FH of Malignant Hyperthermia Pt is not on diet pills Pt is not on  home 02  Pt is not on blood thinners  Pt denies issues with constipation  No A fib or A flutter  Pt is fully vaccinated  for Covid   Due to the COVID-19 pandemic we are asking patients to follow certain guidelines in PV and the [facility]   Pt aware of COVID protocols and [facility] guidelines   PV completed over the phone. Pt verified name, DOB, address and insurance during PV today.  Pt mailed instruction packet with copy of consent form to read and not return, and instructions.  Pt encouraged to call with questions or issues.  If pt has My chart, procedure instructions sent via My Chart    Sample page of over the counter items to purchase.

## 2021-11-28 ENCOUNTER — Other Ambulatory Visit: Payer: Self-pay

## 2021-11-28 ENCOUNTER — Telehealth (INDEPENDENT_AMBULATORY_CARE_PROVIDER_SITE_OTHER): Payer: Medicare Other | Admitting: Neurology

## 2021-11-28 ENCOUNTER — Encounter: Payer: Self-pay | Admitting: Neurology

## 2021-11-28 DIAGNOSIS — I680 Cerebral amyloid angiopathy: Secondary | ICD-10-CM | POA: Diagnosis not present

## 2021-11-28 DIAGNOSIS — R29898 Other symptoms and signs involving the musculoskeletal system: Secondary | ICD-10-CM

## 2021-11-28 DIAGNOSIS — F01A0 Vascular dementia, mild, without behavioral disturbance, psychotic disturbance, mood disturbance, and anxiety: Secondary | ICD-10-CM

## 2021-11-28 DIAGNOSIS — R7303 Prediabetes: Secondary | ICD-10-CM | POA: Diagnosis not present

## 2021-11-28 DIAGNOSIS — I10 Essential (primary) hypertension: Secondary | ICD-10-CM

## 2021-11-28 DIAGNOSIS — E785 Hyperlipidemia, unspecified: Secondary | ICD-10-CM

## 2021-11-28 DIAGNOSIS — I679 Cerebrovascular disease, unspecified: Secondary | ICD-10-CM | POA: Diagnosis not present

## 2021-11-28 NOTE — Patient Instructions (Addendum)
I think the weakness is due to deconditioning.  She should try to exercise more Treat high cholesterol, high blood pressure and prediabetes but avoid aspirin Mediterranean diet (see below) Follow up in 6 months with Sharene Butters, PA-C    Mediterranean Diet A Mediterranean diet refers to food and lifestyle choices that are based on the traditions of countries located on the [employer]. It focuses on eating more fruits, vegetables, whole grains, beans, nuts, seeds, and heart-healthy fats, and eating less dairy, meat, eggs, and processed foods with added sugar, salt, and fat. This way of eating has been shown to help prevent certain conditions and improve outcomes for people who have chronic diseases, like kidney disease and heart disease. What are tips for following this plan? Reading food labels Check the serving size of packaged foods. For foods such as rice and pasta, the serving size refers to the amount of cooked product, not dry. Check the total fat in packaged foods. Avoid foods that have saturated fat or trans fats. Check the ingredient list for added sugars, such as corn syrup. Shopping  Buy a variety of foods that offer a balanced diet, including: Fresh fruits and vegetables (produce). Grains, beans, nuts, and seeds. Some of these may be available in unpackaged forms or large amounts (in bulk). Fresh seafood. Poultry and eggs. Low-fat dairy products. Buy whole ingredients instead of prepackaged foods. Buy fresh fruits and vegetables in-season from local farmers markets. Buy plain frozen fruits and vegetables. If you do not have access to quality fresh seafood, buy precooked frozen shrimp or canned fish, such as tuna, salmon, or sardines. Stock your pantry so you always have certain foods on hand, such as olive oil, canned tuna, canned tomatoes, rice, pasta, and beans. Cooking Cook foods with extra-virgin olive oil instead of using butter or other vegetable oils. Have meat  as a side dish, and have vegetables or grains as your main dish. This means having meat in small portions or adding small amounts of meat to foods like pasta or stew. Use beans or vegetables instead of meat in common dishes like chili or lasagna. Experiment with different cooking methods. Try roasting, broiling, steaming, and sauting vegetables. Add frozen vegetables to soups, stews, pasta, or rice. Add nuts or seeds for added healthy fats and plant protein at each meal. You can add these to yogurt, salads, or vegetable dishes. Marinate fish or vegetables using olive oil, lemon juice, garlic, and fresh herbs. Meal planning Plan to eat one vegetarian meal one day each week. Try to work up to two vegetarian meals, if possible. Eat seafood two or more times a week. Have healthy snacks readily available, such as: Vegetable sticks with hummus. Greek yogurt. Fruit and nut trail mix. Eat balanced meals throughout the week. This includes: Fruit: 2-3 servings a day. Vegetables: 4-5 servings a day. Low-fat dairy: 2 servings a day. Fish, poultry, or lean meat: 1 serving a day. Beans and legumes: 2 or more servings a week. Nuts and seeds: 1-2 servings a day. Whole grains: 6-8 servings a day. Extra-virgin olive oil: 3-4 servings a day. Limit red meat and sweets to only a few servings a month. Lifestyle  Cook and eat meals together with your family, when possible. Drink enough fluid to keep your urine pale yellow. Be physically active every day. This includes: Aerobic exercise like running or swimming. Leisure activities like gardening, walking, or housework. Get 7-8 hours of sleep each night. If recommended by your health care provider, drink  red wine in moderation. This means 1 glass a day for nonpregnant women and 2 glasses a day for men. A glass of wine equals 5 oz (150 mL). What foods should I eat? Fruits Apples. Apricots. Avocado. Berries. Bananas. Cherries. Dates. Figs. Grapes. Lemons.  Melon. Oranges. Peaches. Plums. Pomegranate. Vegetables Artichokes. Beets. Broccoli. Cabbage. Carrots. Eggplant. Green beans. Chard. Kale. Spinach. Onions. Leeks. Peas. Squash. Tomatoes. Peppers. Radishes. Grains Whole-grain pasta. Brown rice. Bulgur wheat. Polenta. Couscous. Whole-wheat bread. [place]. Meats and other proteins Beans. Almonds. Sunflower seeds. Pine nuts. Peanuts. [place]. Salmon. Scallops. Shrimp. [place]. Tilapia. Clams. Oysters. Eggs. Poultry without skin. Dairy Low-fat milk. Cheese. Greek yogurt. Fats and oils Extra-virgin olive oil. Avocado oil. Grapeseed oil. Beverages Water. Red wine. Herbal tea. Sweets and desserts Greek yogurt with honey. Baked apples. Poached pears. Trail mix. Seasonings and condiments Basil. Cilantro. Coriander. Cumin. Mint. Parsley. Sage. Rosemary. Tarragon. Garlic. Oregano. Thyme. Pepper. Balsamic vinegar. Tahini. Hummus. Tomato sauce. Olives. Mushrooms. The items listed above may not be a complete list of foods and beverages you can eat. Contact a dietitian for more information. What foods should I limit? This is a list of foods that should be eaten rarely or only on special occasions. Fruits Fruit canned in syrup. Vegetables Deep-fried potatoes (french fries). Grains Prepackaged pasta or rice dishes. Prepackaged cereal with added sugar. Prepackaged snacks with added sugar. Meats and other proteins Beef. Pork. Lamb. Poultry with skin. Hot dogs. Berniece Salines. Dairy Ice cream. Sour cream. Whole milk. Fats and oils Butter. Canola oil. Vegetable oil. Beef fat (tallow). Lard. Beverages Juice. Sugar-sweetened soft drinks. Beer. Liquor and spirits. Sweets and desserts Cookies. Cakes. Pies. Candy. Seasonings and condiments Mayonnaise. Pre-made sauces and marinades. The items listed above may not be a complete list of foods and beverages you should limit. Contact a dietitian for more information. Summary The Mediterranean diet includes both food  and lifestyle choices. Eat a variety of fresh fruits and vegetables, beans, nuts, seeds, and whole grains. Limit the amount of red meat and sweets that you eat. If recommended by your health care provider, drink red wine in moderation. This means 1 glass a day for nonpregnant women and 2 glasses a day for men. A glass of wine equals 5 oz (150 mL). This information is not intended to replace advice given to you by your health care provider. Make sure you discuss any questions you have with your health care provider. Document Revised: 12/05/2019 Document Reviewed: 10/02/2019 Elsevier Patient Education  2022 [employer].

## 2021-12-07 ENCOUNTER — Ambulatory Visit (AMBULATORY_SURGERY_CENTER): Payer: Medicare Other | Admitting: Gastroenterology

## 2021-12-07 ENCOUNTER — Encounter: Payer: Self-pay | Admitting: Gastroenterology

## 2021-12-07 VITALS — BP 182/95 | HR 80 | Temp 96.6°F | Resp 14 | Ht 63.0 in | Wt 209.0 lb

## 2021-12-07 DIAGNOSIS — Z1211 Encounter for screening for malignant neoplasm of colon: Secondary | ICD-10-CM | POA: Diagnosis not present

## 2021-12-07 DIAGNOSIS — D125 Benign neoplasm of sigmoid colon: Secondary | ICD-10-CM

## 2021-12-07 MED ORDER — SODIUM CHLORIDE 0.9 % IV SOLN: 500.0000 mL | Freq: Once | INTRAVENOUS | Status: DC

## 2021-12-07 NOTE — Progress Notes (Signed)
Called to room to assist during endoscopic procedure.  Patient ID and intended procedure confirmed with present staff. Received instructions for my participation in the procedure from the performing physician.  

## 2021-12-07 NOTE — Patient Instructions (Addendum)
Please read handouts provided. Continue present medications. Await pathology results. Clip Card.   YOU HAD AN ENDOSCOPIC PROCEDURE TODAY AT [facility]:   Refer to the procedure report that was given to you for any specific questions about what was found during the examination.  If the procedure report does not answer your questions, please call your gastroenterologist to clarify.  If you requested that your care partner not be given the details of your procedure findings, then the procedure report has been included in a sealed envelope for you to review at your convenience later.  YOU SHOULD EXPECT: Some feelings of bloating in the abdomen. Passage of more gas than usual.  Walking can help get rid of the air that was put into your GI tract during the procedure and reduce the bloating. If you had a lower endoscopy (such as a colonoscopy or flexible sigmoidoscopy) you may notice spotting of blood in your stool or on the toilet paper. If you underwent a bowel prep for your procedure, you may not have a normal bowel movement for a few days.  Please Note:  You might notice some irritation and congestion in your nose or some drainage.  This is from the oxygen used during your procedure.  There is no need for concern and it should clear up in a day or so.  SYMPTOMS TO REPORT IMMEDIATELY:  Following lower endoscopy (colonoscopy or flexible sigmoidoscopy):  Excessive amounts of blood in the stool  Significant tenderness or worsening of abdominal pains  Swelling of the abdomen that is new, acute  Fever of 100F or higher   For urgent or emergent issues, a gastroenterologist can be reached at any hour by calling [phone]. Do not use MyChart messaging for urgent concerns.    DIET:  We do recommend a small meal at first, but then you may proceed to your regular diet.  Drink plenty of fluids but you should avoid alcoholic beverages for 24 hours.  ACTIVITY:  You should plan to  take it easy for the rest of today and you should NOT DRIVE or use heavy machinery until tomorrow (because of the sedation medicines used during the test).    FOLLOW UP: Our staff will call the number listed on your records 48-72 hours following your procedure to check on you and address any questions or concerns that you may have regarding the information given to you following your procedure. If we do not reach you, we will leave a message.  We will attempt to reach you two times.  During this call, we will ask if you have developed any symptoms of COVID 19. If you develop any symptoms (ie: fever, flu-like symptoms, shortness of breath, cough etc.) before then, please call [phone].  If you test positive for Covid 19 in the 2 weeks post procedure, please call and report this information to [place].    If any biopsies were taken you will be contacted by phone or by letter within the next 1-3 weeks.  Please call us at [phone] if you have not heard about the biopsies in 3 weeks.    SIGNATURES/CONFIDENTIALITY: You and/or your care partner have signed paperwork which will be entered into your electronic medical record.  These signatures attest to the fact that that the information above on your After Visit Summary has been reviewed and is understood.  Full responsibility of the confidentiality of this discharge information lies with you and/or your care-partner.

## 2021-12-07 NOTE — Progress Notes (Signed)
To PACU, VSS. Report to [email or web address] 

## 2021-12-07 NOTE — Progress Notes (Signed)
History and Physical:  This patient presents for endoscopic testing for: Encounter Diagnosis  Name Primary?   Special screening for malignant neoplasms, colon Yes    First screening exam. Patient denies chronic abdominal pain, rectal bleeding, constipation or diarrhea. (IV difficult to obtain)  ROS: Patient denies chest pain or cough   Past Medical History: Past Medical History:  Diagnosis Date   Blood transfusion without reported diagnosis    Diabetes mellitus without complication ([place])    prediabetic-no medications   Gait abnormality    Hyperlipidemia    Hypertension    Neuromuscular disorder ([place])    never damage for the way she walks   Stroke [facility])    mimi     Past Surgical History: Past Surgical History:  Procedure Laterality Date   ABDOMINAL HYSTERECTOMY     maxilo facial surgery      Allergies: No Known Allergies  Outpatient Meds: Current Outpatient Medications  Medication Sig Dispense Refill   amLODipine (NORVASC) 10 MG tablet Take 1 tablet (10 mg total) by mouth daily. 30 tablet 0   atorvastatin (LIPITOR) 20 MG tablet TAKE 1 TABLET EVERY DAY (Patient taking differently: Take 20 mg by mouth daily.) 90 tablet 3   cholecalciferol (VITAMIN D3) 25 MCG (1000 UNIT) tablet Take 1,000 Units by mouth daily.     co-enzyme Q-10 30 MG capsule Take 30 mg by mouth daily.     omega-3 acid ethyl esters (LOVAZA) 1 g capsule Take 1 g by mouth 2 (two) times daily.     acetaminophen (TYLENOL) 500 MG tablet Take 500 mg by mouth every 6 (six) hours as needed for mild pain.     Current Facility-Administered Medications  Medication Dose Route Frequency Provider Last Rate Last Admin   0.9 %  sodium chloride infusion  500 mL Intravenous Once Nelida Meuse III, MD          ___________________________________________________________________ Objective   Exam:  BP (!) 176/105    Pulse 90    Temp (!) 96.6 F (35.9 C)    Resp (!) 21    Ht 5\' 3"  (1.6 m)    Wt 209 lb (94.8 kg)     SpO2 99%    BMI 37.02 kg/m   CV: RRR without murmur, S1/S2 Resp: clear to auscultation bilaterally, normal RR and effort noted GI: soft, no tenderness, with active bowel sounds.   Assessment: Encounter Diagnosis  Name Primary?   Special screening for malignant neoplasms, colon Yes     Plan: Colonoscopy  The benefits and risks of the planned procedure were described in detail with the patient or (when appropriate) their health care proxy.  Risks were outlined as including, but not limited to, bleeding, infection, perforation, adverse medication reaction leading to cardiac or pulmonary decompensation, pancreatitis (if ERCP).  The limitation of incomplete mucosal visualization was also discussed.  No guarantees or warranties were given.    The patient is appropriate for an endoscopic procedure in the ambulatory setting.   - Wilfrid Lund, MD

## 2021-12-07 NOTE — Op Note (Signed)
[place] Patient Name: Sheila Perez Procedure Date: 12/07/2021 3:33 PM MRN: [id number] Endoscopist: [place]. Loletha Carrow , MD Age: 76 Referring MD:  Date of Birth: 1946-02-15 Gender: Female Account #: [id number] Procedure:                Colonoscopy Indications:              Screening for colorectal malignant neoplasm, This                            is the patient's first colonoscopy Medicines:                Monitored Anesthesia Care Procedure:                Pre-Anesthesia Assessment:                           - Prior to the procedure, a History and Physical                            was performed, and patient medications and                            allergies were reviewed. The patient's tolerance of                            previous anesthesia was also reviewed. The risks                            and benefits of the procedure and the sedation                            options and risks were discussed with the patient.                            All questions were answered, and informed consent                            was obtained. Prior Anticoagulants: The patient has                            taken no previous anticoagulant or antiplatelet                            agents. ASA Grade Assessment: III - A patient with                            severe systemic disease. After reviewing the risks                            and benefits, the patient was deemed in                            satisfactory condition to undergo the procedure.  After obtaining informed consent, the colonoscope                            was passed under direct vision. Throughout the                            procedure, the patient's blood pressure, pulse, and                            oxygen saturations were monitored continuously. The                            CF [id number] #[id number] was introduced through the anus                            and advanced to  the the cecum, identified by                            appendiceal orifice and ileocecal valve. The                            colonoscopy was performed without difficulty. The                            patient tolerated the procedure well. The quality                            of the bowel preparation was good. The ileocecal                            valve, appendiceal orifice, and rectum were                            photographed. Scope In: 3:37:25 PM Scope Out: 4:12:56 PM Scope Withdrawal Time: 0 hours 31 minutes 49 seconds  Total Procedure Duration: 0 hours 35 minutes 31 seconds  Findings:                 The digital rectal exam findings include decreased                            sphincter tone.                           A 22-25 mm polyp was found in the recto-sigmoid                            colon (approximately 17cm from anal verge and                            proximal to the uppermost rectal fold). The polyp                            was multi-lobulated and pedunculated. Area (stalk)  was successfully injected with 1 mL of a 1:100,000                            solution of epinephrine for prevention of bleeding.                            The polyp was removed en bloc with a hot snare.                            Resection and retrieval were complete. To prevent                            bleeding post-intervention, one hemostatic clip was                            successfully placed (MR conditional). There was no                            bleeding during the procedure. Area was tattooed                            with an injection of 0.5 mL of Spot (carbon black)                            x 4 (two each above and below polyectomy site).                           Internal hemorrhoids were found.                           The exam was otherwise without abnormality on                            direct and retroflexion views. Complications:             No immediate complications. Estimated Blood Loss:     Estimated blood loss: none. Impression:               - Decreased sphincter tone found on digital rectal                            exam.                           - One 25 mm polyp at the recto-sigmoid colon,                            removed with a hot snare. Resected and retrieved.                            Injected. Clip (MR conditional) was placed.                            Tattooed.                           -  Internal hemorrhoids.                           - The examination was otherwise normal on direct                            and retroflexion views. Recommendation:           - Patient has a contact number available for                            emergencies. The signs and symptoms of potential                            delayed complications were discussed with the                            patient. Return to normal activities tomorrow.                            Written discharge instructions were provided to the                            patient.                           - Resume previous diet.                           - Continue present medications.                           - Await pathology results.                           - Repeat colonoscopy is recommended for                            surveillance. The colonoscopy date will be                            determined after pathology results from today's                            exam become available for review. Future                            colonoscopy should be done in hospital outpatient                            setting due to difficulty obtaining IV access. Zoe Creasman L. Loletha Carrow, MD 12/07/2021 4:20:44 PM This report has been signed electronically.

## 2021-12-07 NOTE — Progress Notes (Signed)
Pt's states no medical or surgical changes since previsit or office visit. VS by CW. 

## 2021-12-08 ENCOUNTER — Telehealth: Payer: Self-pay | Admitting: Internal Medicine

## 2021-12-08 NOTE — Telephone Encounter (Signed)
Colonoscopy + polypectomy yesterday Ate solids yesterday Ate solids just now and vomited No pain, fever, bleeding. Not clear if defecation yet.  Advice:  Liquids tonight Retry solids tomorrow  Call back prn

## 2021-12-09 ENCOUNTER — Telehealth: Payer: Self-pay | Admitting: *Deleted

## 2021-12-09 NOTE — Telephone Encounter (Signed)
°  Follow up Call-  Call back number 12/07/2021  Post procedure Call Back phone  # [phone]  Permission to leave phone message Yes  Some recent data might be hidden     Patient questions:  Do you have a fever, pain , or abdominal swelling? No. Pain Score  0 *  Have you tolerated food without any problems? Yes.    Have you been able to return to your normal activities? Yes.    Do you have any questions about your discharge instructions: Diet   No. Medications  No. Follow up visit  No.  Do you have questions or concerns about your Care? No.  Actions: * If pain score is 4 or above: No action needed, pain <4.  Pt's daughter says she called on call physician yesterday due to pt having vomited and just not as active as usual and that physician recommended she eat something light such as soup . Daughter gave pt broth .Patient did not throw up any more but not as "active" as usual. Encouraged daughter to increase pt's liquid intake since she lost a lot of fluid with prep for her procedure and she could be dehydrated and encouraged pt to call back if no improvement today.

## 2021-12-09 NOTE — Telephone Encounter (Signed)
°  Follow up Call-  Call back number 12/07/2021  Post procedure Call Back phone  # [phone]  Permission to leave phone message Yes  Some recent data might be hidden    Spoke with daughter- pt has not vomited any more since yesterday, able to eat Patient questions:  Do you have a fever, pain , or abdominal swelling? No. Pain Score  0 *  Have you tolerated food without any problems? Yes.    Have you been able to return to your normal activities? Yes.    Do you have any questions about your discharge instructions: Diet   No. Medications  No. Follow up visit  No.  Do you have questions or concerns about your Care? No.  Actions: * If pain score is 4 or above: No action needed, pain <4.  Have you developed a fever since your procedure? no  2.   Have you had an respiratory symptoms (SOB or cough) since your procedure? no  3.   Have you tested positive for COVID 19 since your procedure no  4.   Have you had any family members/close contacts diagnosed with the COVID 19 since your procedure?  no   If yes to any of these questions please route to Joylene John, RN and Joella Prince, RN

## 2021-12-12 ENCOUNTER — Telehealth: Payer: Self-pay | Admitting: Family Medicine

## 2021-12-12 DIAGNOSIS — I639 Cerebral infarction, unspecified: Secondary | ICD-10-CM

## 2021-12-12 DIAGNOSIS — I680 Cerebral amyloid angiopathy: Secondary | ICD-10-CM

## 2021-12-12 DIAGNOSIS — Z7409 Other reduced mobility: Secondary | ICD-10-CM

## 2021-12-12 NOTE — Telephone Encounter (Signed)
New home health referral placed.

## 2021-12-12 NOTE — Telephone Encounter (Signed)
Pt daughter is calling and her mother has new Publishing copy and she needs a new referral for in home health care due to change in insurance. Pt needs assistant with Daily living skills, meal prep, bathing, ambulatory. Pt would like to be refer to [facility] [place] [facility] phone number [phone]

## 2021-12-13 ENCOUNTER — Encounter: Payer: Self-pay | Admitting: Gastroenterology

## 2021-12-20 NOTE — Telephone Encounter (Signed)
Pt daughter is calling again and does not want the referral to [facility] but to [facility] [place] [employer] phone number is [phone]

## 2021-12-27 NOTE — Telephone Encounter (Signed)
I left pt's daughter a message.   - I faxed everything over to [facility] (referral & Insurance) - I advised daughter to call us back if she hasn't heard anything back by Friday. - They should contact us if they need anything further from [place].

## 2021-12-27 NOTE — Telephone Encounter (Signed)
Patient daughter called again to follow up on referral. I let her know that it was still being worked on by the referral coordinator and as of last week they were checking to see if a new referral needed to be put in. Daughter asked if referral had been sent to insurance yet and I told her that was something the referral coordinator would know. Patient's daughter would like a callback to discuss referral and status      Please advise

## 2021-12-28 ENCOUNTER — Telehealth: Payer: Self-pay | Admitting: Family Medicine

## 2021-12-28 NOTE — Telephone Encounter (Signed)
Noted  

## 2021-12-28 NOTE — Telephone Encounter (Deleted)
Baxter Flattery owner with [facility] will fax over the referral form to be completed for in home health care and fax to [facility]  and then [facility] will fax the form to [facility]

## 2021-12-28 NOTE — Telephone Encounter (Signed)
Baxter Flattery owner with [facility] is calling and will fax over the referral form for the office to complete and then we must fax to [facility] and per [place] will send the form to them.

## 2021-12-29 NOTE — Telephone Encounter (Signed)
Pt daughter is aware referral has been placed in md folder

## 2021-12-29 NOTE — Telephone Encounter (Signed)
The referral form from [facility] is in md folder

## 2021-12-30 NOTE — Telephone Encounter (Signed)
Hey, they are going to need a visit ( it has to be within 90 days) it can be virtual if that is easier for the daughter, but we have to have an updated visit in order for them to approve the request.

## 2022-01-03 NOTE — Telephone Encounter (Signed)
Pt is sch for virtual on 01-09-2022 at 4 pm

## 2022-01-05 ENCOUNTER — Ambulatory Visit (INDEPENDENT_AMBULATORY_CARE_PROVIDER_SITE_OTHER): Payer: Medicare Other

## 2022-01-05 DIAGNOSIS — I680 Cerebral amyloid angiopathy: Secondary | ICD-10-CM

## 2022-01-05 DIAGNOSIS — E785 Hyperlipidemia, unspecified: Secondary | ICD-10-CM

## 2022-01-05 DIAGNOSIS — I639 Cerebral infarction, unspecified: Secondary | ICD-10-CM

## 2022-01-05 DIAGNOSIS — I10 Essential (primary) hypertension: Secondary | ICD-10-CM

## 2022-01-05 NOTE — Patient Instructions (Signed)
Visit Information  Thank you for taking time to visit with me today. Please don't hesitate to contact me if I can be of assistance to you before our next scheduled telephone appointment.  Following are the goals we discussed today:  Take all medications as prescribed Attend all scheduled provider appointments Call pharmacy for medication refills 3-7 days in advance of running out of medications Call provider office for new concerns or questions  check blood pressure daily choose a place to take my blood pressure (home, clinic or office, retail store) take blood pressure log to all doctor appointments call doctor for signs and symptoms of high blood pressure keep all doctor appointments take medications for blood pressure exactly as prescribed eat more whole grains, fruits and vegetables, lean meats and healthy fats limit salt intake to 2000mg /day call for medicine refill 2 or 3 days before it runs out take all medications exactly as prescribed call doctor with any symptoms you believe are related to your medicine  Our next appointment is by telephone on 02/09/22 at 3 PM  Please call the care guide team at [phone] if you need to cancel or reschedule your appointment.   If you are experiencing a Mental Health or [place] or need someone to talk to, please call the Suicide and Crisis Lifeline: 988 call the [place] National Suicide Prevention Lifeline: 6361524134 or TTY: (901)643-6908 TTY (774)267-0236) to talk to a trained counselor call 1-800-273-TALK (toll free, 24 hour hotline) go to [facility] [address] [place] [phone]) call 911   The patient verbalized understanding of instructions, educational materials, and care plan provided today and agreed to receive a mailed copy of patient instructions, educational materials, and care plan.  Peter Garter RN, Jackquline Denmark, CDE Care Management Coordinator [facility] [phone]

## 2022-01-05 NOTE — Chronic Care Management (AMB) (Signed)
Chronic Care Management   CCM RN Visit Note  01/05/2022 Name: Sheila Perez MRN: [id number] DOB: 02-13-1946  Subjective: Sheila Perez Jamison Yuhasz is a 76 y.o. year old female who is a primary care patient of [name], [name], MD. The care management team was consulted for assistance with disease management and care coordination needs.    Engaged with patient by telephone for follow up visit in response to provider referral for case management and/or care coordination services.   Consent to Services:  The patient was given information about Chronic Care Management services, agreed to services, and gave verbal consent prior to initiation of services.  Please see initial visit note for detailed documentation.   Patient agreed to services and verbal consent obtained.   Assessment: Review of patient past medical history, allergies, medications, health status, including review of consultants reports, laboratory and other test data, was performed as part of comprehensive evaluation and provision of chronic care management services.   SDOH (Social Determinants of Health) assessments and interventions performed:    CCM Care Plan  No Known Allergies  Outpatient Encounter Medications as of 01/05/2022  Medication Sig   acetaminophen (TYLENOL) 500 MG tablet Take 500 mg by mouth every 6 (six) hours as needed for mild pain.   amLODipine (NORVASC) 10 MG tablet Take 1 tablet (10 mg total) by mouth daily.   atorvastatin (LIPITOR) 20 MG tablet TAKE 1 TABLET EVERY DAY (Patient taking differently: Take 20 mg by mouth daily.)   cholecalciferol (VITAMIN D3) 25 MCG (1000 UNIT) tablet Take 1,000 Units by mouth daily.   co-enzyme Q-10 30 MG capsule Take 30 mg by mouth daily.   omega-3 acid ethyl esters (LOVAZA) 1 g capsule Take 1 g by mouth 2 (two) times daily.   No facility-administered encounter medications on file as of 01/05/2022.    Patient Active Problem List   Diagnosis Date Noted    Vitamin D deficiency, unspecified 04/18/2021   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CODE) 04/18/2021   Morbid obesity ([place]) 05/19/2020   PAD (peripheral artery disease) ([place]) 05/07/2020   Urinary incontinence in female 02/27/2020   Hyperlipidemia 02/18/2020   Prediabetes 02/18/2020   CVA (cerebral vascular accident) ([place]) 02/18/2020   Hypertension, essential, benign 01/30/2020    Conditions to be addressed/monitored:HLD, Dementia, and hx CVA  Care Plan : RN Care Manager Plan of Care  Updates made by [name], RN since 01/05/2022 12:00 AM     Problem: Chronic Disease Management and Care Coordination Needs (HTN, hx CVA, memory issues and HLD)   Priority: High     Long-Range Goal: Establish Plan of Care for Chronic Disease Management Needs (HTN, hx CVA, memory issues and HLD)   Start Date: 11/08/2021  Expected End Date: 05/07/2022  Priority: High  Note:   Current Barriers:  Care Coordination needs related to Level of care concerns, ADL IADL limitations, Memory Deficits, Inability to perform ADL's independently, and Inability to perform IADL's independently Chronic Disease Management support and education needs related to HTN, HLD, Dementia, and hx CVA  Spoke with daughter [name] and granddaughter Lonn [place] who stays with pt during the day. States that pt's B/P has been up and down and last reading was 165/90.   States she is giving the pt her medications and making sure she is eating healthy low sodium low CHO foods.  States pt does not move around much and she tries to encourage her to move more. Granddaughter states pt had a fall last  week with no injury. states she is using her Rolator but also uses furniture to steady herself. States she has been giving pt more fluids.  States they are working on getting more home help with pt's new insurance.    RNCM Clinical Goal(s):  Patient will verbalize understanding of plan for management of HTN, HLD, Dementia,  and hx CVA  as evidenced by voiced adherence to plan of care verbalize basic understanding of  HTN, HLD, Dementia, and hx CVA  disease process and self health management plan as evidenced by voiced understanding and teach back take all medications exactly as prescribed and will call provider for medication related questions as evidenced by dispense report and pt verbalization attend all scheduled medical appointments: Dr. [name] 01/09/22,Neurology 05/29/22 as evidenced by medical records demonstrate Improved adherence to prescribed treatment plan for HTN, HLD, Dementia, and hx CVA  as evidenced by readings within limits, voiced adherence to plan of care continue to work with RN Care Manager to address care management and care coordination needs related to  HTN, HLD, Dementia, and hx CVA  as evidenced by adherence to CM Team Scheduled appointments through collaboration with RN Care manager, provider, and care team.   Interventions: 1:1 collaboration with primary care provider regarding development and update of comprehensive plan of care as evidenced by provider attestation and co-signature Inter-disciplinary care team collaboration (see longitudinal plan of care) Evaluation of current treatment plan related to  self management and patient's adherence to plan as established by provider   Hyperlipidemia Interventions:  (Status:  Goal on track:  Yes.) Long Term Goal Medication review performed; medication list updated in electronic medical record.  Counseled on importance of regular laboratory monitoring as prescribed Reviewed role and benefits of statin for ASCVD risk reduction Reviewed importance of limiting foods high in cholesterol  Hypertension Interventions:  (Status:  Goal on track:  NO.) Long Term Goal Last practice recorded BP readings:  BP Readings from Last 3 Encounters:  12/07/21 (!) 182/95  11/07/21 (!) 160/103  08/29/21 (!) 150/100  Most recent eGFR/CrCl: No results found for: EGFR   No components found for: CRCL  Evaluation of current treatment plan related to hypertension self management and patient's adherence to plan as established by provider Provided education to patient re: stroke prevention, s/s of heart attack and stroke Reviewed medications with patient and discussed importance of compliance Discussed plans with patient for ongoing care management follow up and provided patient with direct contact information for care management team Advised patient, providing education and rationale, to monitor blood pressure daily and record, calling PCP for findings outside established parameters Provided education on prescribed diet low sodium Reinforced to call provider for high B/P readings  Dementia:  (Status:  Goal on track:  Yes.)  Long Term Goal Evaluation of current treatment plan related to misuse of: Vascular/Multi-infarct dementia, mild Consideration of in-home help encouraged , Advised to contact provider for new or worsening symptoms, and Reinforced to continue to work on getting assistance in home from new insurance   Stroke:  (Status:Goal on track:  Yes.) Long Term Goal Reviewed Importance of taking all medications as prescribed Reviewed Importance of attending all scheduled provider appointments Advised to report any changes in symptoms or exercise tolerance Assessed for signs and symptoms of stroke Assessed for management of bladder and/or bowel incontinence Reviewed the importance of exercise Reinforced fall prevention and home safety. Reviewed to use walker at all times  Patient Goals/Self-Care Activities: Take all medications as prescribed Attend  all scheduled provider appointments Call pharmacy for medication refills 3-7 days in advance of running out of medications Call provider office for new concerns or questions  check blood pressure daily choose a place to take my blood pressure (home, clinic or office, retail store) take blood pressure log to  all doctor appointments call doctor for signs and symptoms of high blood pressure keep all doctor appointments take medications for blood pressure exactly as prescribed eat more whole grains, fruits and vegetables, lean meats and healthy fats limit salt intake to $RemoveB'2000mg'putFwJSX$ /day call for medicine refill 2 or 3 days before it runs out take all medications exactly as prescribed call doctor with any symptoms you believe are related to your medicine  Follow Up Plan:  Telephone follow up appointment with care management team member scheduled for:  02/09/22 The patient has been provided with contact information for the care management team and has been advised to call with any health related questions or concerns.       Plan:Telephone follow up appointment with care management team member scheduled for:  02/09/22 The patient has been provided with contact information for the care management team and has been advised to call with any health related questions or concerns.  Peter Garter RN, Jackquline Denmark, CDE Care Management Coordinator [facility] [phone]

## 2022-01-09 ENCOUNTER — Telehealth (INDEPENDENT_AMBULATORY_CARE_PROVIDER_SITE_OTHER): Payer: Medicare Other | Admitting: Family Medicine

## 2022-01-09 ENCOUNTER — Encounter: Payer: Self-pay | Admitting: Family Medicine

## 2022-01-09 VITALS — Ht 63.0 in

## 2022-01-09 DIAGNOSIS — I739 Peripheral vascular disease, unspecified: Secondary | ICD-10-CM | POA: Diagnosis not present

## 2022-01-09 DIAGNOSIS — I10 Essential (primary) hypertension: Secondary | ICD-10-CM | POA: Diagnosis not present

## 2022-01-09 DIAGNOSIS — Z7409 Other reduced mobility: Secondary | ICD-10-CM | POA: Diagnosis not present

## 2022-01-09 DIAGNOSIS — I639 Cerebral infarction, unspecified: Secondary | ICD-10-CM

## 2022-01-09 DIAGNOSIS — I680 Cerebral amyloid angiopathy: Secondary | ICD-10-CM

## 2022-01-09 NOTE — Assessment & Plan Note (Signed)
LDL has not been at goal, last 188 in 04/2021. Continue atorvastatin 20 mg daily and low-fat diet. We will plan on checking lipid panel at next visit.

## 2022-01-09 NOTE — Assessment & Plan Note (Addendum)
Fall precautions to continue. Needs assistance with ADL's. Form completed and signed, it will be faxed to [employer].

## 2022-01-09 NOTE — Assessment & Plan Note (Addendum)
Following with neurologist. Next f/u appt 05/29/22.

## 2022-01-09 NOTE — Assessment & Plan Note (Signed)
According to her daughter, her BP is most of the time < 140/90, she does not think another medication is to be added at this time. Continue amlodipine 10 mg daily. Continue monitoring BP regularly and low-salt diet.

## 2022-01-09 NOTE — Progress Notes (Signed)
Virtual Visit via Telephone Note I connected with Sheila Perez on 01/09/22 at  4:00 PM EST by telephone and verified that I am speaking with the correct person using two identifiers.   I discussed the limitations, risks, security and privacy concerns of performing an evaluation and management service by telephone and the availability of in person appointments. I also discussed with the patient that there may be a patient responsible charge related to this service. The patient expressed understanding and agreed to proceed.  Location patient: home Location provider: work office Participants present for the call: patient,daughter, provider Patient did not have a visit in the prior 7 days to address this/these issue(s).  Chief Complaint  Patient presents with   Referral    Has referral to home health, but needed to be seen within 90 days for them to accept it.   History of Present Illness: Sheila Perez is a 76 y.o.female with hx of HTN,obesity,prediabetes,HLD,PAD,and CVA on telephone visit for personal care services renewal.  Her daughter provides most of the information today. She has a new health insurance that is going to provide assistance with ADL's. No new problems since her last visit, 08/29/21. She needs assistance with bathing,dressing,grooming,and meal prep.  Hypertension on amlodipine 10 mg daily. BP usually ranges between 140/80-165/90. Negative for severe/frequent headache, visual changes, chest pain, dyspnea, palpitation, focal weakness, or edema.  Limited mobility. She walks some around the house but it takes a lot of effort to do so, so most of the time she is seated. She has had a couple falls when trying to transfer from bed to chair with no assistance, no serious injuries.  HLD on Lovastatin 20 mg daily. She is following a healthful diet, not gaining any wt. PAD: ABI in 04/2020 showed mild LE PAD.  Lab Results  Component Value Date   CHOL 249  (H) 04/18/2021   HDL 45.10 04/18/2021   LDLCALC 188 (H) 04/18/2021   TRIG 78.0 04/18/2021   CHOLHDL 6 04/18/2021   CVA and cerebral amyloid angiopathy. Since her last visit she has seen her neurologist, 6 months follow up was recommended.  Observations/Objective: Patient sounds cheerful and well on the phone. I do not appreciate any SOB. Speech and thought processing are grossly intact. Patient reported vitals:Ht 5\' 3"  (1.6 m)    BMI 37.02 kg/m   Assessment and Plan:  Limited mobility Fall precautions to continue. Needs assistance with ADL's. Form completed and signed, it will be faxed to [employer].  Hypertension, essential, benign According to her daughter, her BP is most of the time < 140/90, she does not think another medication is to be added at this time. Continue amlodipine 10 mg daily. Continue monitoring BP regularly and low-salt diet.  PAD (peripheral artery disease) ([facility]) LDL has not been at goal, last 188 in 04/2021. Continue atorvastatin 20 mg daily and low-fat diet. We will plan on checking lipid panel at next visit.  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CODE) Following with neurologist. Next f/u appt 05/29/22.  Follow Up Instructions:  Return in about 4 months (around 05/09/2022).  I did not refer this patient for an OV in the next 24 hours for this/these issue(s).  I discussed the assessment and treatment plan with the patient. The patient was provided an opportunity to ask questions and all were answered. The patient agreed with the plan and demonstrated an understanding of the instructions.   The patient was advised to call back or seek an in-person evaluation if the  symptoms worsen or if the condition fails to improve as anticipated.  I provided  15 minutes of non-face-to-face time during this encounter. Cohen Doleman G. [name], MD  [facility]. [place] office.

## 2022-01-10 DIAGNOSIS — I10 Essential (primary) hypertension: Secondary | ICD-10-CM

## 2022-01-10 DIAGNOSIS — I639 Cerebral infarction, unspecified: Secondary | ICD-10-CM | POA: Diagnosis not present

## 2022-01-10 DIAGNOSIS — F0390 Unspecified dementia without behavioral disturbance: Secondary | ICD-10-CM

## 2022-01-10 DIAGNOSIS — E785 Hyperlipidemia, unspecified: Secondary | ICD-10-CM | POA: Diagnosis not present

## 2022-02-09 ENCOUNTER — Ambulatory Visit (INDEPENDENT_AMBULATORY_CARE_PROVIDER_SITE_OTHER): Payer: Medicare Other

## 2022-02-09 DIAGNOSIS — I639 Cerebral infarction, unspecified: Secondary | ICD-10-CM

## 2022-02-09 DIAGNOSIS — E785 Hyperlipidemia, unspecified: Secondary | ICD-10-CM

## 2022-02-09 DIAGNOSIS — I680 Cerebral amyloid angiopathy: Secondary | ICD-10-CM

## 2022-02-09 DIAGNOSIS — I10 Essential (primary) hypertension: Secondary | ICD-10-CM

## 2022-02-09 NOTE — Patient Instructions (Signed)
Visit Information ? ?Thank you for taking time to visit with me today. Please don't hesitate to contact me if I can be of assistance to you before our next scheduled telephone appointment. ? ?Following are the goals we discussed today:  ?Take all medications as prescribed ?Attend all scheduled provider appointments ?Call pharmacy for medication refills 3-7 days in advance of running out of medications ?Call provider office for new concerns or questions  ?check blood pressure daily ?choose a place to take my blood pressure (home, clinic or office, retail store) ?take blood pressure log to all doctor appointments ?call doctor for signs and symptoms of high blood pressure ?keep all doctor appointments ?take medications for blood pressure exactly as prescribed ?eat more whole grains, fruits and vegetables, lean meats and healthy fats ?limit salt intake to '2000mg'$ /day ?call for medicine refill 2 or 3 days before it runs out ?take all medications exactly as prescribed ?call doctor with any symptoms you believe are related to your medicine ? ?Our next appointment is by telephone on 05/04/22 at 11:30 AM ? ?Please call the care guide team at [phone] if you need to cancel or reschedule your appointment.  ? ?If you are experiencing a Mental Health or [place] or need someone to talk to, please call the Suicide and Crisis Lifeline: 988 ?call the [place] National Suicide Prevention Lifeline: (315) 546-1584 or TTY: (940) 713-4529 TTY 281-532-1426) to talk to a trained counselor ?call 1-800-273-TALK (toll free, 24 hour hotline) ?go to [facility] [address], [place] [phone]) ?call 911  ? ?The patient verbalized understanding of instructions, educational materials, and care plan provided today and agreed to receive a mailed copy of patient instructions, educational materials, and care plan.  ?Peter Garter RN, BSN,CCM, CDE ?Care Management Coordinator ?[facility] ?[phone]   ?

## 2022-02-09 NOTE — Chronic Care Management (AMB) (Signed)
?Chronic Care Management  ? ?CCM RN Visit Note ? ?02/09/2022 ?Name: Sheila Perez MRN: [id number] DOB: 16-May-1946 ? ?Subjective: ?[id number] Sheila Perez is a 76 y.o. year old female who is a primary care patient of [name], [name], MD. The care management team was consulted for assistance with disease management and care coordination needs.   ? ?Engaged with patient by telephone for follow up visit in response to provider referral for case management and/or care coordination services.  ? ?Consent to Services:  ?The patient was given information about Chronic Care Management services, agreed to services, and gave verbal consent prior to initiation of services.  Please see initial visit note for detailed documentation.  ? ?Patient agreed to services and verbal consent obtained.  ? ?Assessment: Review of patient past medical history, allergies, medications, health status, including review of consultants reports, laboratory and other test data, was performed as part of comprehensive evaluation and provision of chronic care management services.  ? ?SDOH (Social Determinants of Health) assessments and interventions performed:   ? ?CCM Care Plan ? ?No Known Allergies ? ?Outpatient Encounter Medications as of 02/09/2022  ?Medication Sig  ? acetaminophen (TYLENOL) 500 MG tablet Take 500 mg by mouth every 6 (six) hours as needed for mild pain.  ? amLODipine (NORVASC) 10 MG tablet Take 1 tablet (10 mg total) by mouth daily.  ? atorvastatin (LIPITOR) 20 MG tablet TAKE 1 TABLET EVERY DAY (Patient taking differently: Take 20 mg by mouth daily.)  ? cholecalciferol (VITAMIN D3) 25 MCG (1000 UNIT) tablet Take 1,000 Units by mouth daily.  ? co-enzyme Q-10 30 MG capsule Take 30 mg by mouth daily.  ? omega-3 acid ethyl esters (LOVAZA) 1 g capsule Take 1 g by mouth 2 (two) times daily.  ? ?No facility-administered encounter medications on file as of 02/09/2022.  ? ? ?Patient Active Problem List  ? Diagnosis Date Noted  ?  Limited mobility 01/09/2022  ? Vitamin D deficiency, unspecified 04/18/2021  ? 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CODE) 04/18/2021  ? Morbid obesity ([place]) 05/19/2020  ? PAD (peripheral artery disease) ([place]) 05/07/2020  ? Urinary incontinence in female 02/27/2020  ? Hyperlipidemia 02/18/2020  ? Prediabetes 02/18/2020  ? CVA (cerebral vascular accident) ([place]) 02/18/2020  ? Hypertension, essential, benign 01/30/2020  ? ? ?Conditions to be addressed/monitored:HTN, HLD, Dementia, and hx CVA ? ?Care Plan : RN Care Manager Plan of Care  ?Updates made by [name], RN since 02/09/2022 12:00 AM  ?  ? ?Problem: Chronic Disease Management and Care Coordination Needs (HTN, hx CVA, memory issues and HLD)   ?Priority: High  ?  ? ?Long-Range Goal: Establish Plan of Care for Chronic Disease Management Needs (HTN, hx CVA, memory issues and HLD)   ?Start Date: 11/08/2021  ?Expected End Date: 05/07/2022  ?Priority: High  ?Note:   ?Current Barriers:  ?Care Coordination needs related to Level of care concerns, ADL IADL limitations, Memory Deficits, Inability to perform ADL's independently, and Inability to perform IADL's independently ?Chronic Disease Management support and education needs related to HTN, HLD, Dementia, and hx CVA  ?Spoke with daughter Systems analyst. States that pt's B/P has been up and down and last reading was 168/85.   States she is giving the pt her medications and making sure she is eating healthy low sodium low CHO foods.  States pt does not move around much and she tries to encourage her to move more.  Denies any recent falls. States she is using her Rolator  but also uses furniture to steady herself. States she has been giving pt more fluids.  States they are still working on getting more home help with pt's insurance and she plans to call the insurance back today to check on the approval. States that they have had the agency that they picked out come and evaluate her that all the  forms have been sent to the insurance company  ? ?RNCM Clinical Goal(s):  ?Patient will verbalize understanding of plan for management of HTN, HLD, Dementia, and hx CVA  as evidenced by voiced adherence to plan of care ?verbalize basic understanding of  HTN, HLD, Dementia, and hx CVA  disease process and self health management plan as evidenced by voiced understanding and teach back ?take all medications exactly as prescribed and will call provider for medication related questions as evidenced by dispense report and pt verbalization ?attend all scheduled medical appointments: Neurology 05/29/22 as evidenced by medical records ?demonstrate Improved adherence to prescribed treatment plan for HTN, HLD, Dementia, and hx CVA  as evidenced by readings within limits, voiced adherence to plan of care ?continue to work with RN Care Manager to address care management and care coordination needs related to  HTN, HLD, Dementia, and hx CVA  as evidenced by adherence to CM Team Scheduled appointments through collaboration with RN Care manager, provider, and care team.  ? ?Interventions: ?1:1 collaboration with primary care provider regarding development and update of comprehensive plan of care as evidenced by provider attestation and co-signature ?Inter-disciplinary care team collaboration (see longitudinal plan of care) ?Evaluation of current treatment plan related to  self management and patient's adherence to plan as established by provider ? ? ?Hyperlipidemia Interventions:  (Status:  Goal on track:  Yes.) Long Term Goal ?Medication review performed; medication list updated in electronic medical record.  ?Counseled on importance of regular laboratory monitoring as prescribed ?Reviewed role and benefits of statin for ASCVD risk reduction ?Reviewed importance of limiting foods high in cholesterol ?Reviewed to try to eat more fiber ? ?Hypertension Interventions:  (Status:  Goal on track:  NO.) Long Term Goal ?Last practice  recorded BP readings:  ?BP Readings from Last 3 Encounters:  ?12/07/21 (!) 182/95  ?11/07/21 (!) 160/103  ?08/29/21 (!) 150/100  ?Most recent eGFR/CrCl: No results found for: EGFR  No components found for: CRCL ? ?Evaluation of current treatment plan related to hypertension self management and patient's adherence to plan as established by provider ?Provided education to patient re: stroke prevention, s/s of heart attack and stroke ?Reviewed medications with patient and discussed importance of compliance ?Discussed plans with patient for ongoing care management follow up and provided patient with direct contact information for care management team ?Advised patient, providing education and rationale, to monitor blood pressure daily and record, calling PCP for findings outside established parameters ?Provided education on prescribed diet low sodium ?Reinforced to call provider for high B/P readings. Reviewed to try to have pt do PT exercises ? ?Dementia:  (Status:  Goal on track:  Yes.)  Long Term Goal ?Evaluation of current treatment plan related to misuse of: Vascular/Multi-infarct dementia, mild ?Consideration of in-home help encouraged , Advised to contact provider for new or worsening symptoms, and Reinforced to continue to work on getting assistance in home from new insurance and to continue to contact her insurance company to check of status of referral ? ? ?Stroke:  (Status:Goal on track:  Yes.) Long Term Goal ?Reviewed Importance of taking all medications as prescribed ?Reviewed Importance of attending  all scheduled provider appointments ?Advised to report any changes in symptoms or exercise tolerance ?Assessed for signs and symptoms of stroke ?Assessed for management of bladder and/or bowel incontinence ?Reviewed the importance of exercise ?Reinforced fall prevention and home safety. Reinforced to use walker at all times ? ?Patient Goals/Self-Care Activities: ?Take all medications as prescribed ?Attend all  scheduled provider appointments ?Call pharmacy for medication refills 3-7 days in advance of running out of medications ?Call provider office for new concerns or questions  ?check blood pressure daily ?choose a place

## 2022-02-10 DIAGNOSIS — I639 Cerebral infarction, unspecified: Secondary | ICD-10-CM | POA: Diagnosis not present

## 2022-02-10 DIAGNOSIS — I10 Essential (primary) hypertension: Secondary | ICD-10-CM

## 2022-02-10 DIAGNOSIS — E785 Hyperlipidemia, unspecified: Secondary | ICD-10-CM

## 2022-03-15 NOTE — Progress Notes (Signed)
? ? ?ACUTE VISIT ?Chief Complaint  ?Patient presents with  ? lump on breast  ?  Noticed last weekend  ? ?HPI: ?Sheila Perez Sheila Perez is a 76 y.o. female, who is here today with her daughter, who is concerned because last week when she was receiving a bath and lump in left breast was noted. She has not complained of pain.  ?Her daughter provides most of hx, she answers yes or no to some questions. ? ?Negative for skin changes or nipple discharge. ?No hx of trauma. ?She has not had a mammogram in years. ?FHx negative for breast or ovarian cancer ?She has lost some wt because her daughter is cooking her meals and portions. ?She has poor mobility, so she is not exercising regularly, most of the time she is seated. ? ?Left shoulder pain for "a while", always holding her arm and avoiding using it. ?No hx of trauma. ?Negative for edema,erythema,or deformity. ? ?BP elevated today. ?She has not taken her medication today, she is on Amlodipine 10 mg daily. ?Not checking BP at home. ?Her BP has been elevated in the past, her daughter was concerned about her being on so many medications. ?Negative for severe/frequent headache, visual changes, chest pain, dyspnea, palpitation, focal weakness, or edema. ? ?Lab Results  ?Component Value Date  ? CREATININE 0.93 08/27/2021  ? BUN 16 08/27/2021  ? NA 137 08/27/2021  ? K 4.0 08/27/2021  ? CL 105 08/27/2021  ? CO2 24 08/27/2021  ? ?Her doughtier would like cholesterol check today, has not eating in over 8 hours. ?She is on Atorvastatin 20 mg daily. ?PAD and CVA (Cerebral amyloid angiopathy). ?ABI in 04/2020:Right: Resting right ankle-brachial index indicates mild right lower extremity arterial disease. The right toe-brachial index is abnormal.  ?Left: Resting left ankle-brachial index indicates mild left lower extremity arterial disease. The left toe-brachial index is abnormal. ?Negative for claudication like symptoms when she is ambulatory. ? ?Lab Results  ?Component Value Date   ? CHOL 249 (H) 04/18/2021  ? HDL 45.10 04/18/2021  ? LDLCALC 188 (H) 04/18/2021  ? TRIG 78.0 04/18/2021  ? CHOLHDL 6 04/18/2021  ? ?Review of Systems  ?Constitutional:  Negative for activity change, appetite change and fever.  ?HENT:  Negative for mouth sores, nosebleeds and sore throat.   ?Respiratory:  Negative for cough and wheezing.   ?Gastrointestinal:  Negative for abdominal pain, nausea and vomiting.  ?     Negative for changes in bowel habits.  ?Endocrine: Positive for cold intolerance. Negative for heat intolerance.  ?Genitourinary:  Negative for decreased urine volume and hematuria.  ?Musculoskeletal:  Positive for arthralgias and gait problem.  ?Neurological:  Negative for syncope, facial asymmetry and weakness.  ?Rest see pertinent positives and negatives per HPI. ? ?Current Outpatient Medications on File Prior to Visit  ?Medication Sig Dispense Refill  ? acetaminophen (TYLENOL) 500 MG tablet Take 500 mg by mouth every 6 (six) hours as needed for mild pain.    ? amLODipine (NORVASC) 10 MG tablet Take 1 tablet (10 mg total) by mouth daily. 30 tablet 0  ? atorvastatin (LIPITOR) 20 MG tablet TAKE 1 TABLET EVERY DAY (Patient taking differently: Take 20 mg by mouth daily.) 90 tablet 3  ? cholecalciferol (VITAMIN D3) 25 MCG (1000 UNIT) tablet Take 1,000 Units by mouth daily.    ? co-enzyme Q-10 30 MG capsule Take 30 mg by mouth daily.    ? omega-3 acid ethyl esters (LOVAZA) 1 g capsule Take 1 g by mouth  2 (two) times daily.    ? ?No current facility-administered medications on file prior to visit.  ? ?Past Medical History:  ?Diagnosis Date  ? Blood transfusion without reported diagnosis   ? Diabetes mellitus without complication ([place])   ? prediabetic-no medications  ? Gait abnormality   ? Hyperlipidemia   ? Hypertension   ? Neuromuscular disorder ([place])   ? never damage for the way she walks  ? Stroke [facility])   ? mimi  ? ?No Known Allergies ? ?Social History  ? ?Socioeconomic History  ? Marital status: Widowed   ?  Spouse name: Not on file  ? Number of children: 2  ? Years of education: 10  ? Highest education level: 10th grade  ?Occupational History  ? Occupation: Retired  ?Tobacco Use  ? Smoking status: Never  ? Smokeless tobacco: Never  ?Vaping Use  ? Vaping Use: Never used  ?Substance and Sexual Activity  ? Alcohol use: No  ? Drug use: No  ? Sexual activity: Not Currently  ?Other Topics Concern  ? Not on file  ?Social History Narrative  ? ** Merged History Encounter **  ?    ? Right handed ?Lives daughter in a two story home but lives on first floor ?Drinks no caffeine   ? ?Social Determinants of Health  ? ?Financial Resource Strain: Low Risk   ? Difficulty of Paying Living Expenses: Not hard at all  ?Food Insecurity: No Food Insecurity  ? Worried About Charity fundraiser in the Last Year: Never true  ? Ran Out of Food in the Last Year: Never true  ?Transportation Needs: No Transportation Needs  ? Lack of Transportation (Medical): No  ? Lack of Transportation (Non-Medical): No  ?Physical Activity: Inactive  ? Days of Exercise per Week: 0 days  ? Minutes of Exercise per Session: 0 min  ?Stress: No Stress Concern Present  ? Feeling of Stress : Not at all  ?Social Connections: Socially Isolated  ? Frequency of Communication with Friends and Family: More than three times a week  ? Frequency of Social Gatherings with Friends and Family: More than three times a week  ? Attends Religious Services: Never  ? Active Member of Clubs or Organizations: No  ? Attends Archivist Meetings: Never  ? Marital Status: Widowed  ? ?Vitals:  ? 03/17/22 1540  ?BP: (!) 160/90  ?Pulse: 71  ?Resp: 16  ?SpO2: 99%  ? ?Wt Readings from Last 3 Encounters:  ?03/17/22 181 lb (82.1 kg)  ?12/07/21 209 lb (94.8 kg)  ?11/25/21 209 lb (94.8 kg)  ? ?Body mass index is 32.06 kg/m?. ? ?Physical Exam ?Vitals and nursing note reviewed.  ?Constitutional:   ?   General: She is not in acute distress. ?   Appearance: She is well-developed.  ?HENT:  ?    Head: Normocephalic and atraumatic.  ?   Mouth/Throat:  ?   Mouth: Mucous membranes are moist.  ?Eyes:  ?   Conjunctiva/sclera: Conjunctivae normal.  ?Cardiovascular:  ?   Rate and Rhythm: Normal rate and regular rhythm.  ?   Heart sounds: No murmur heard. ?Pulmonary:  ?   Effort: Pulmonary effort is normal. No respiratory distress.  ?   Breath sounds: Normal breath sounds.  ?Chest:  ? ? ?   Comments: She could not get on examination table, so breast exam was done while in wheel chair. ?Abdominal:  ?   Palpations: Abdomen is soft.  ?   Tenderness: There  is no abdominal tenderness.  ?Musculoskeletal:  ?   Right shoulder: No tenderness or bony tenderness. Decreased range of motion.  ?   Right lower leg: No edema.  ?   Left lower leg: No edema.  ?   Comments: Left shoulder:Limited abduction and internal rotation. ?Mild muscle atrophy. Could not perform other maneuvers, denies pain but avoid movement.  ?Lymphadenopathy:  ?   Upper Body:  ?   Left upper body: Axillary adenopathy (Not tender) present.  ?Skin: ?   General: Skin is warm.  ?   Findings: No erythema or rash.  ?Neurological:  ?   General: No focal deficit present.  ?   Mental Status: She is alert. Mental status is at baseline.  ?   Comments: Unstable gait, cannot walk w/o assistance. She is in a wheel chair.  ? ?ASSESSMENT AND PLAN: ? ?Ms. ELMRAJHH was seen today for lump on breast. ? ?Diagnoses and all orders for this visit: ?Orders Placed This Encounter  ?Procedures  ? DG Shoulder Left  ? MM DIAG BREAST TOMO BILATERAL  ? TSH  ? Lipid panel  ? BMP with eGFR([facility])  ? Ambulatory referral to Home Health  ? ?Lab Results  ?Component Value Date  ? CREATININE 1.12 (H) 03/17/2022  ? BUN 19 03/17/2022  ? NA 142 03/17/2022  ? K 3.8 03/17/2022  ? CL 105 03/17/2022  ? CO2 25 03/17/2022  ? ?Lab Results  ?Component Value Date  ? TSH 1.26 03/17/2022  ? ?Lab Results  ?Component Value Date  ? CHOL 258 (H) 03/17/2022  ? HDL 39 (L) 03/17/2022  ? LDLCALC 202 (H) 03/17/2022  ?  TRIG 72 03/17/2022  ? CHOLHDL 6.6 (H) 03/17/2022  ? 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 ??Adhesive capsulitis. ?PT through [facility] will be arranged. ? ?Breast mass in female ?Bilateral. ?Given her

## 2022-03-17 ENCOUNTER — Encounter: Payer: Self-pay | Admitting: Family Medicine

## 2022-03-17 ENCOUNTER — Ambulatory Visit (INDEPENDENT_AMBULATORY_CARE_PROVIDER_SITE_OTHER): Payer: Medicare Other

## 2022-03-17 ENCOUNTER — Ambulatory Visit (INDEPENDENT_AMBULATORY_CARE_PROVIDER_SITE_OTHER): Payer: Medicare Other | Admitting: Family Medicine

## 2022-03-17 VITALS — BP 160/90 | HR 71 | Resp 16 | Ht 63.0 in | Wt 181.0 lb

## 2022-03-17 DIAGNOSIS — I739 Peripheral vascular disease, unspecified: Secondary | ICD-10-CM

## 2022-03-17 DIAGNOSIS — M7542 Impingement syndrome of left shoulder: Secondary | ICD-10-CM | POA: Diagnosis not present

## 2022-03-17 DIAGNOSIS — N63 Unspecified lump in unspecified breast: Secondary | ICD-10-CM | POA: Diagnosis not present

## 2022-03-17 DIAGNOSIS — I10 Essential (primary) hypertension: Secondary | ICD-10-CM

## 2022-03-17 DIAGNOSIS — E785 Hyperlipidemia, unspecified: Secondary | ICD-10-CM

## 2022-03-17 DIAGNOSIS — M25512 Pain in left shoulder: Secondary | ICD-10-CM | POA: Diagnosis not present

## 2022-03-17 IMAGING — DX DG SHOULDER 2+V*L*
3 series · 3 of 3 positions shown · non-contrast
Comparison: None

CLINICAL DATA: Left shoulder pain, limitation of motion. Muscle
atrophy.

EXAM:
LEFT SHOULDER - 2+ VIEW

[shoulder internal rotation ap]
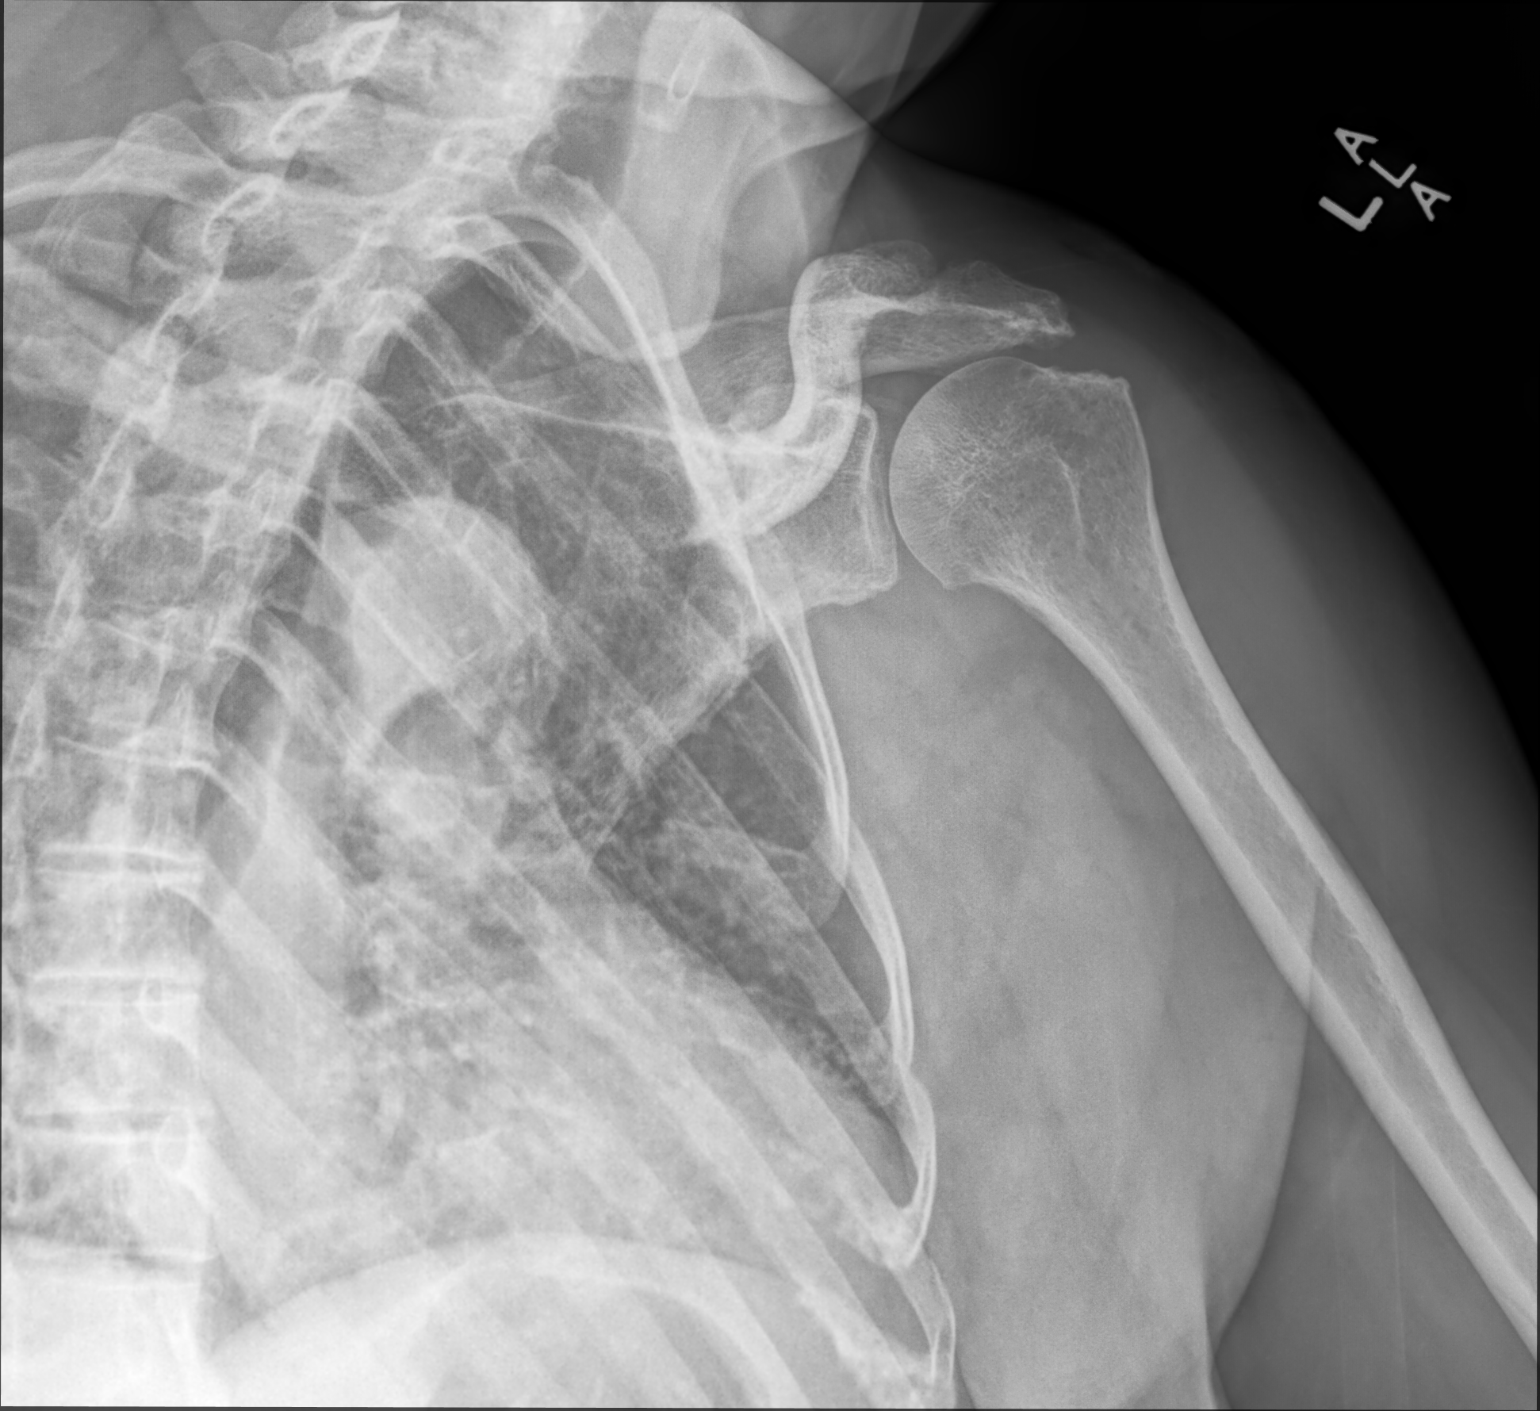

[shoulder (y view)]
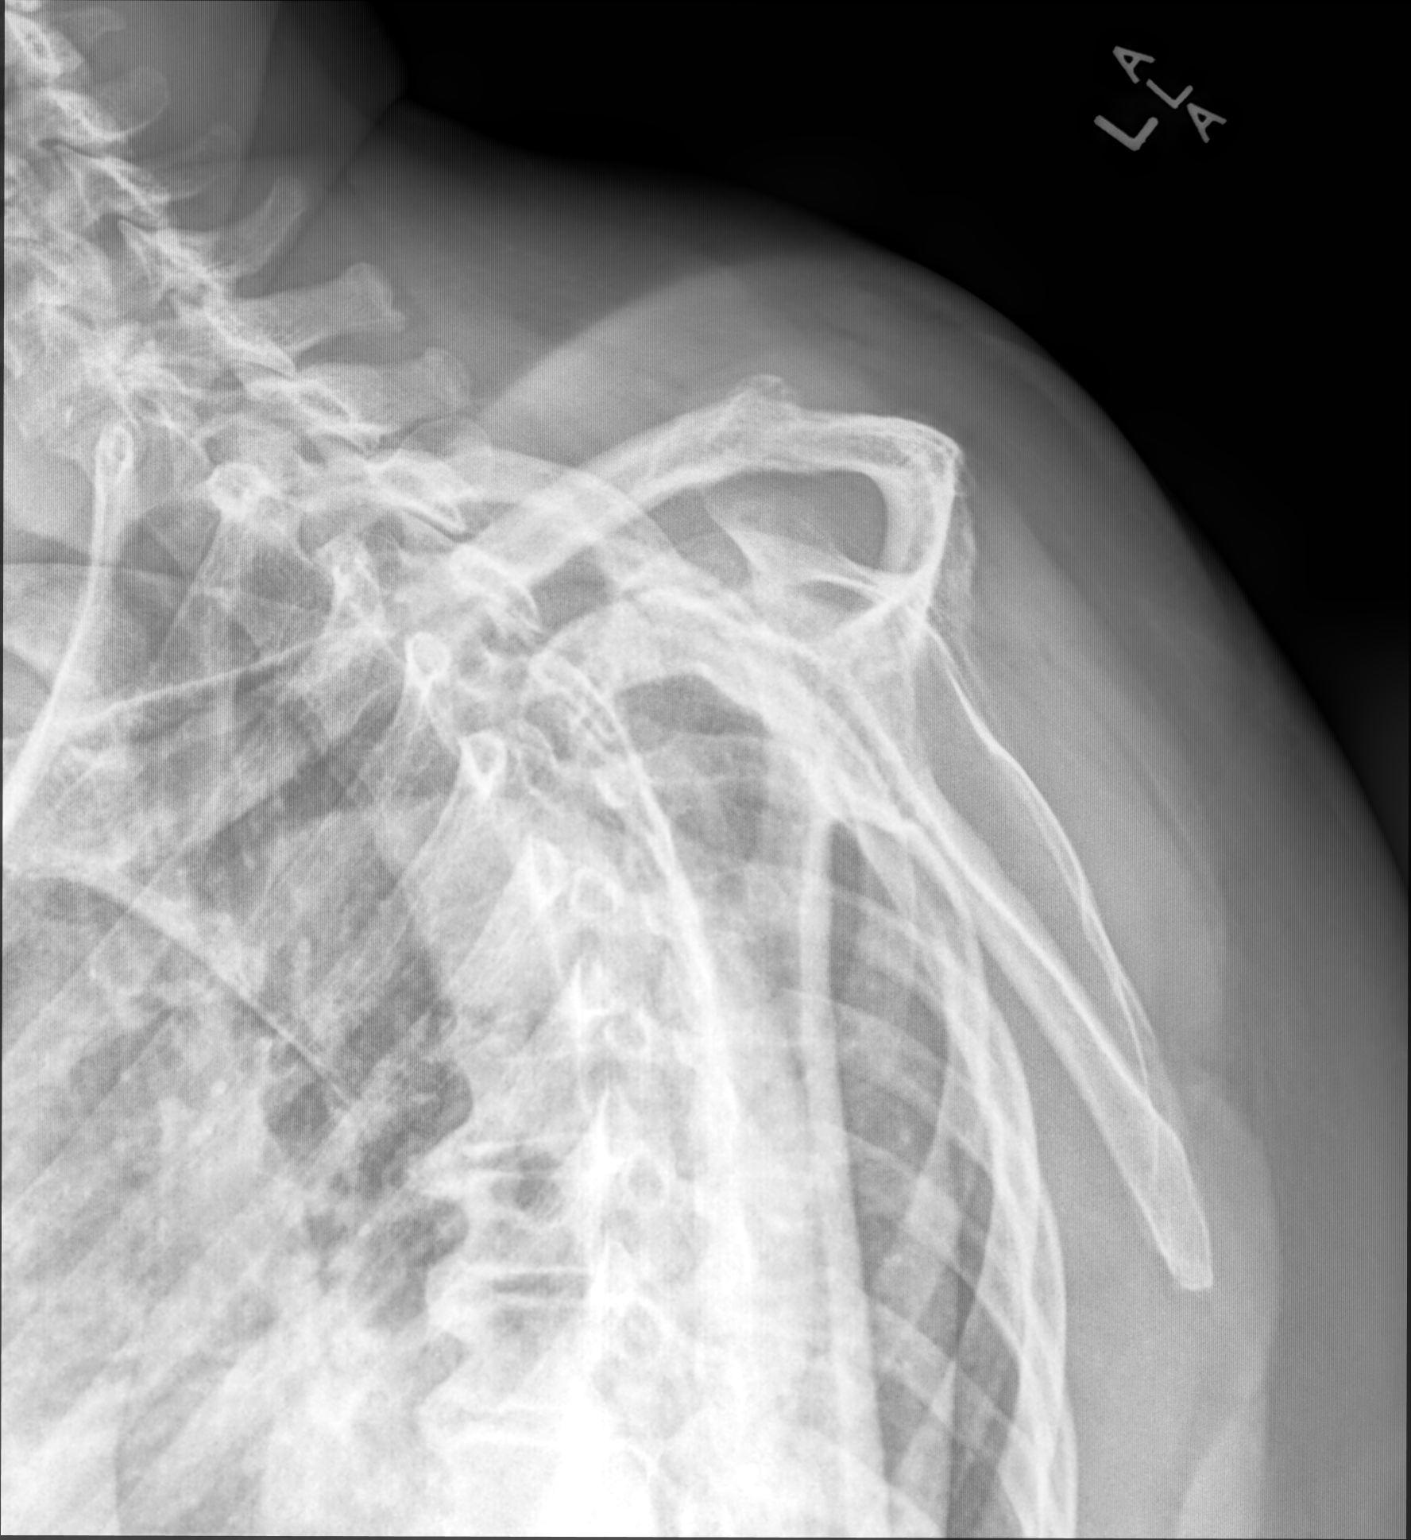

[shoulder (axial)]
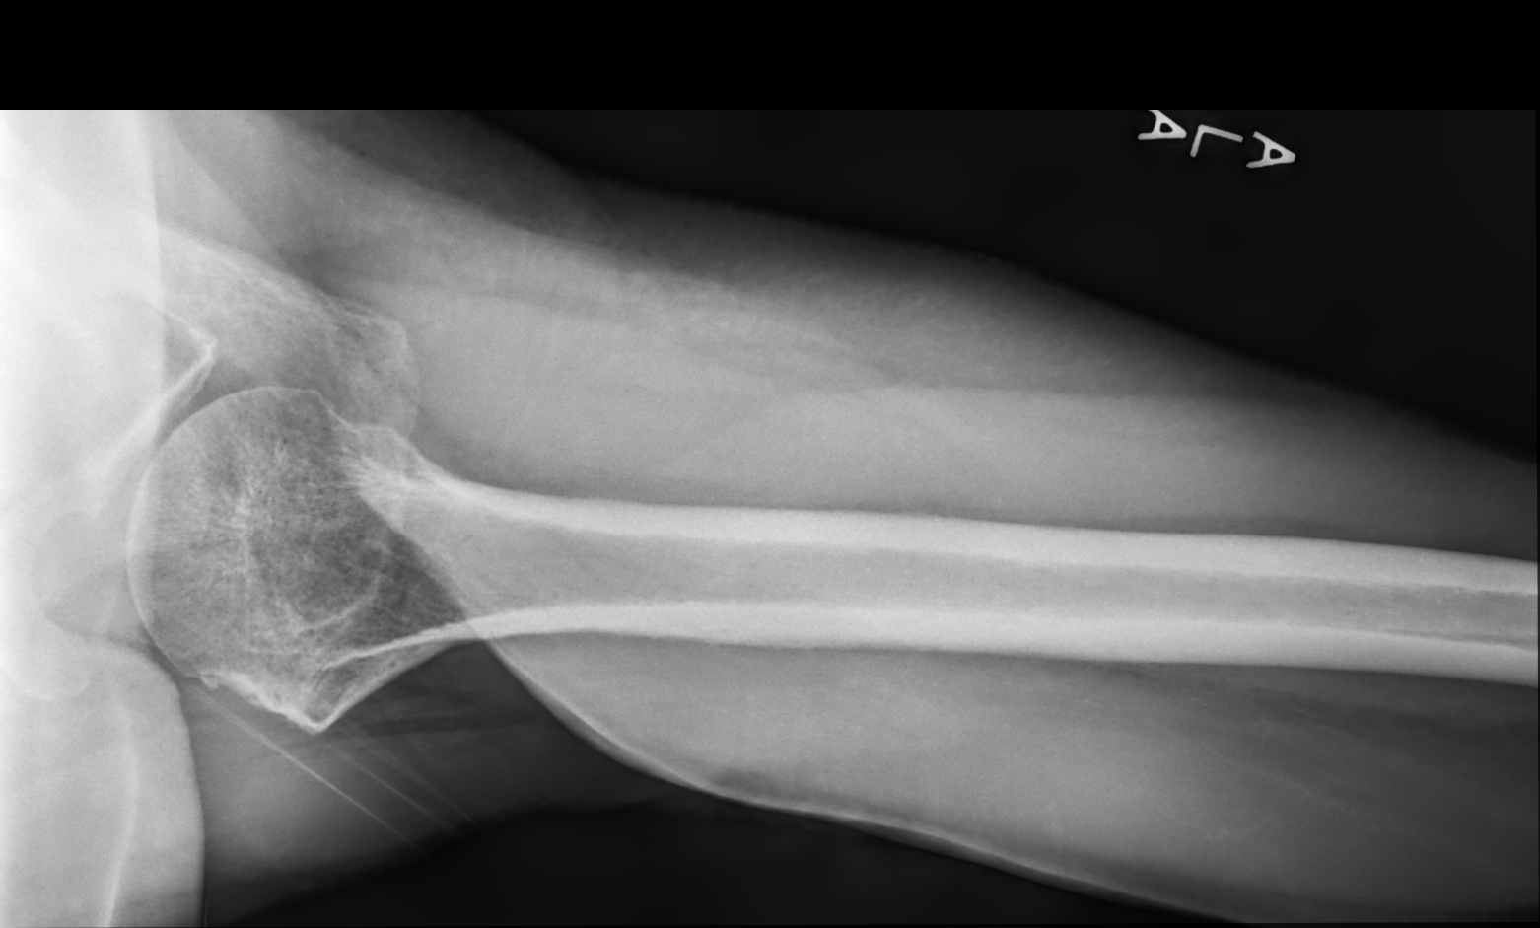

[3 of 3 positions shown; findings below may reference images not displayed]

FINDINGS: Osseous demineralization.

AC joint alignment normal.

Minimal acromial spurring.

No acute fracture, dislocation, or bone destruction.

Visualized ribs intact.
IMPRESSION: No acute osseous abnormalities.

Osseous demineralization with minimal acromial spurring.

## 2022-03-17 NOTE — Assessment & Plan Note (Signed)
Has lost about 20+ Lb since her last visit. ?She has limited mobility, so not able to exercise regularly. ?Continue a healthful diet. ?

## 2022-03-17 NOTE — Assessment & Plan Note (Addendum)
Continue Atorvastatin 20 mg daily and low fat diet. ?Further recommendations according to lipid panel results. ?

## 2022-03-17 NOTE — Patient Instructions (Addendum)
A few things to remember from today's visit: ? ? ?Hypertension, essential, benign - Plan: Basic metabolic panel, TSH, TSH, Basic metabolic panel, BMP with eGFR([facility]), CANCELED: TSH, CANCELED: Basic metabolic panel ? ?Impingement syndrome, shoulder, left - Plan: DG Shoulder Left ? ?Hyperlipidemia, unspecified hyperlipidemia type - Plan: Lipid panel, Lipid panel, CANCELED: Lipid panel ? ?If you need refills please call your pharmacy. ?Do not use My Chart to request refills or for acute issues that need immediate attention. ?  ?Blood pressure should be at least under 150/90. ?Will arrange mammogram and PT (left shoulder). ?No changes today. ? ?Please be sure medication list is accurate. ?If a new problem present, please set up appointment sooner than planned today. ? ? ? ? ? ? ? ?

## 2022-03-17 NOTE — Assessment & Plan Note (Addendum)
BP elevated today. ?Problem has not been adequately controlled. ?We discussed BP goals. ?Daughter does not want to adjust treatment for now. ?Monitor BP at home. ?Continue low salt diet. ?Continue Amlodipine 10 mg daily. ?Will have [place] checking BP. ?

## 2022-03-17 NOTE — Assessment & Plan Note (Signed)
Continue Atorvastatin 20 mg daily. ?Further recommendations according to lab results. ?

## 2022-03-18 LAB — LIPID PANEL
Cholesterol: 258 mg/dL — ABNORMAL HIGH (ref <200)
HDL: 39 mg/dL — ABNORMAL LOW (ref >=50)
LDL Cholesterol (Calc): 202 mg/dL (calc) — ABNORMAL HIGH
Non-HDL Cholesterol (Calc): 219 mg/dL (calc) — ABNORMAL HIGH (ref <130)
Total CHOL/HDL Ratio: 6.6 (calc) — ABNORMAL HIGH (ref <5.0)
Triglycerides: 72 mg/dL (ref <150)

## 2022-03-18 LAB — BASIC METABOLIC PANEL
BUN/Creatinine Ratio: 17 (calc) (ref 6–22)
BUN: 19 mg/dL (ref 7–25)
CO2: 25 mmol/L (ref 20–32)
Calcium: 9.6 mg/dL (ref 8.6–10.4)
Chloride: 105 mmol/L (ref 98–110)
Creat: 1.12 mg/dL — ABNORMAL HIGH (ref 0.60–1.00)
Glucose, Bld: 81 mg/dL (ref 65–99)
Potassium: 3.8 mmol/L (ref 3.5–5.3)
Sodium: 142 mmol/L (ref 135–146)

## 2022-03-18 LAB — TSH: TSH: 1.26 mIU/L (ref 0.40–4.50)

## 2022-03-20 ENCOUNTER — Other Ambulatory Visit: Payer: Self-pay

## 2022-03-20 MED ORDER — ATORVASTATIN CALCIUM 40 MG PO TABS: 40.0000 mg | ORAL_TABLET | Freq: Every day | ORAL | 3 refills | Status: DC

## 2022-03-22 ENCOUNTER — Other Ambulatory Visit: Payer: Self-pay | Admitting: Family Medicine

## 2022-03-22 DIAGNOSIS — N63 Unspecified lump in unspecified breast: Secondary | ICD-10-CM

## 2022-03-31 ENCOUNTER — Ambulatory Visit
Admission: RE | Admit: 2022-03-31 | Discharge: 2022-03-31 | Disposition: A | Discharge status: Home / Self Care | Payer: Medicare Other | Source: Ambulatory Visit | Attending: Family Medicine | Admitting: Family Medicine

## 2022-03-31 ENCOUNTER — Other Ambulatory Visit: Payer: Self-pay | Admitting: Family Medicine

## 2022-03-31 DIAGNOSIS — N6325 Unspecified lump in the left breast, overlapping quadrants: Secondary | ICD-10-CM | POA: Diagnosis not present

## 2022-03-31 DIAGNOSIS — R599 Enlarged lymph nodes, unspecified: Secondary | ICD-10-CM

## 2022-03-31 DIAGNOSIS — N6320 Unspecified lump in the left breast, unspecified quadrant: Secondary | ICD-10-CM

## 2022-03-31 DIAGNOSIS — N63 Unspecified lump in unspecified breast: Secondary | ICD-10-CM

## 2022-03-31 DIAGNOSIS — N6310 Unspecified lump in the right breast, unspecified quadrant: Secondary | ICD-10-CM

## 2022-03-31 DIAGNOSIS — R921 Mammographic calcification found on diagnostic imaging of breast: Secondary | ICD-10-CM | POA: Diagnosis not present

## 2022-03-31 DIAGNOSIS — N6315 Unspecified lump in the right breast, overlapping quadrants: Secondary | ICD-10-CM | POA: Diagnosis not present

## 2022-03-31 IMAGING — US US BREAST*R* LIMITED INC AXILLA
1 series · 10 of 10 positions shown · non-contrast
Comparison: None.

CLINICAL DATA: 75-year-old female with a palpable left breast lump.
The patient is in a wheelchair with markedly limited mobility. The
best possible images were obtained.



[Series 1: us breast*right* limited inc axilla · 0.06mm/px · 10 of 10 slices shown]
[im 1/10]
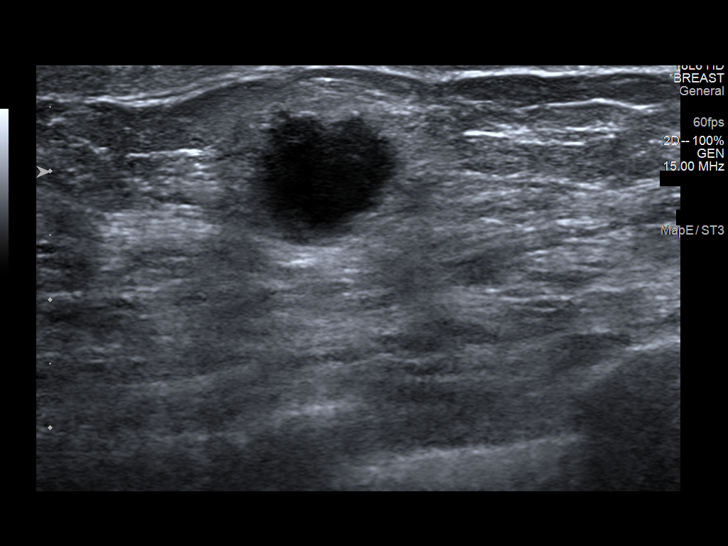
[im 2/10]
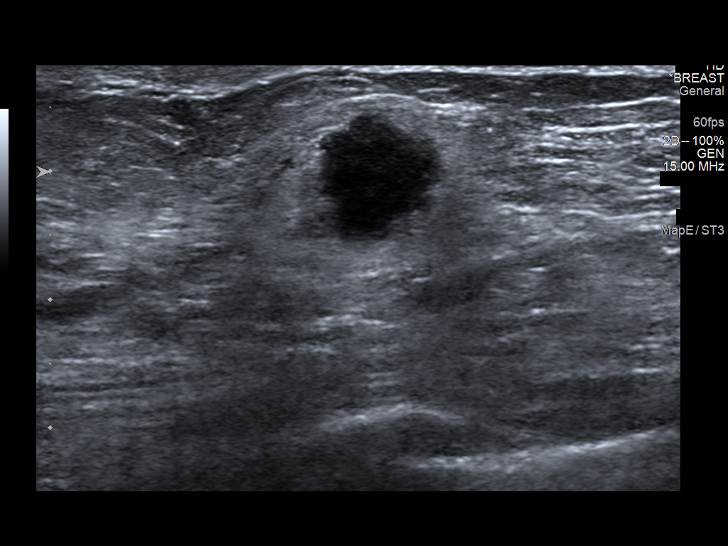
[im 3/10]
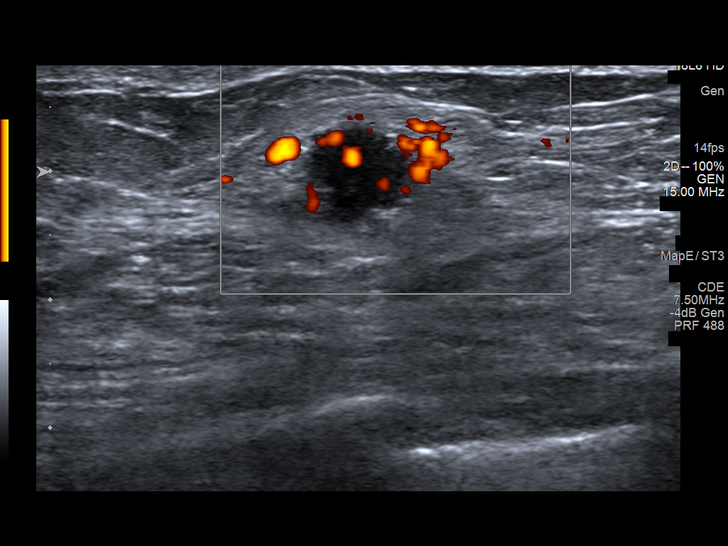
[im 4/10]
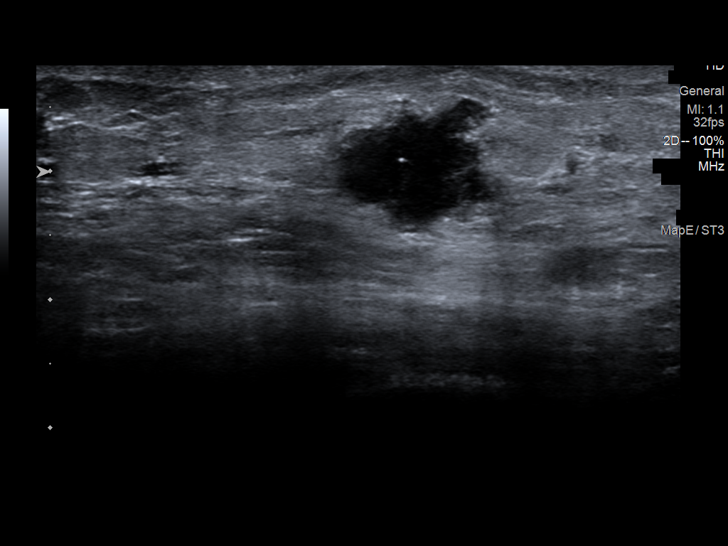
[im 5/10]
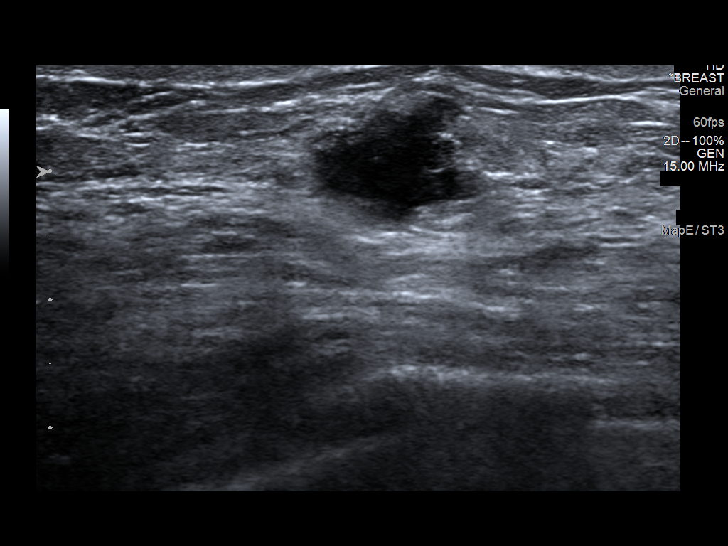
[im 6/10]
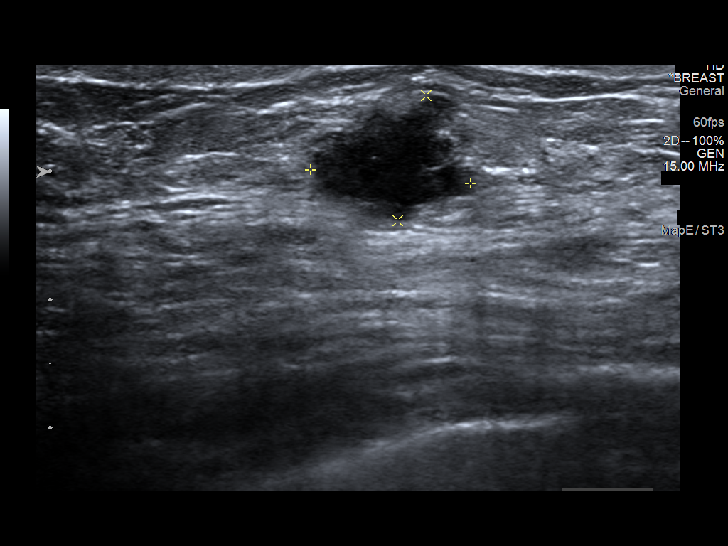
[im 7/10]
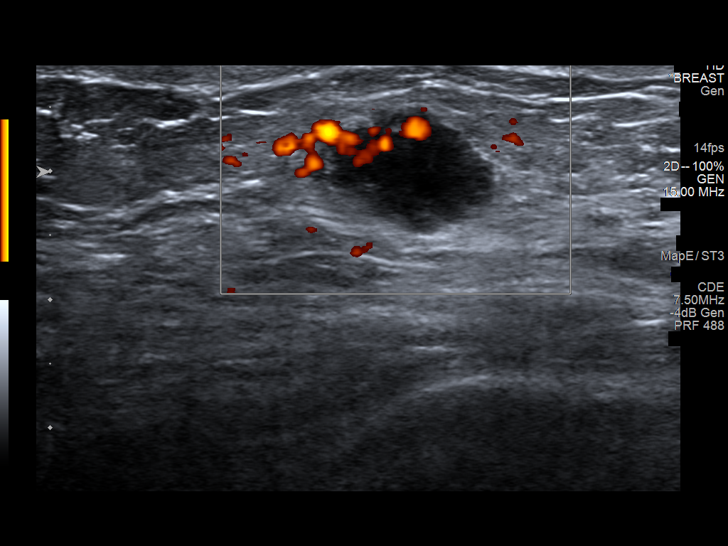
[im 8/10]
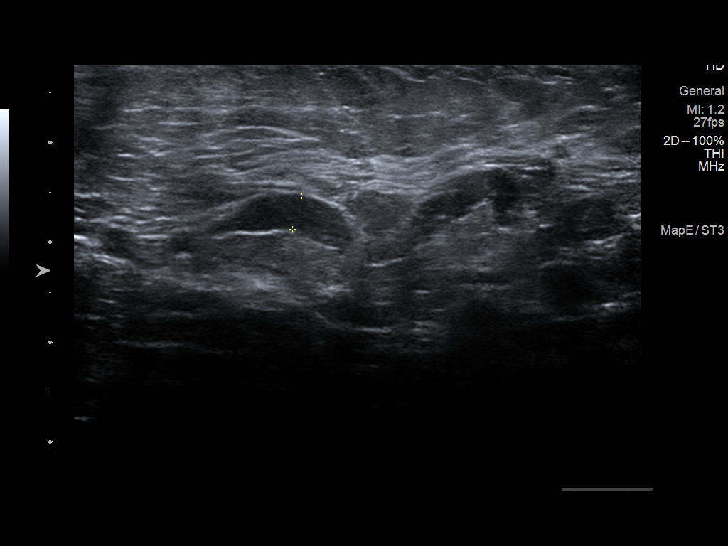
[im 9/10]
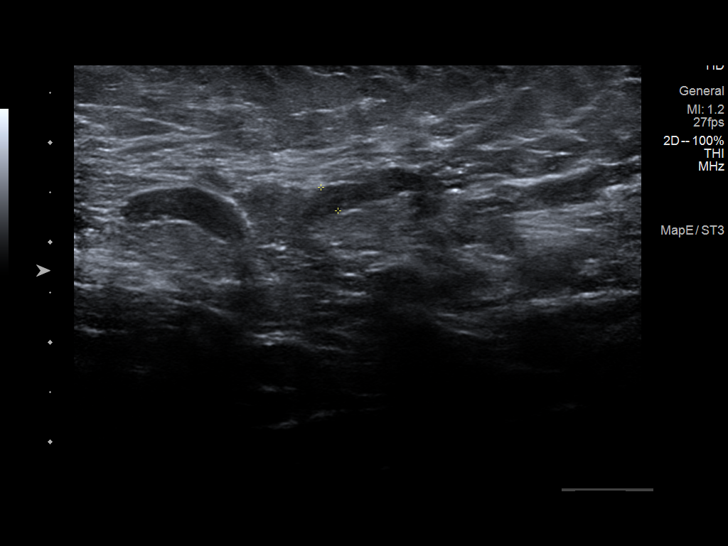
[im 10/10]
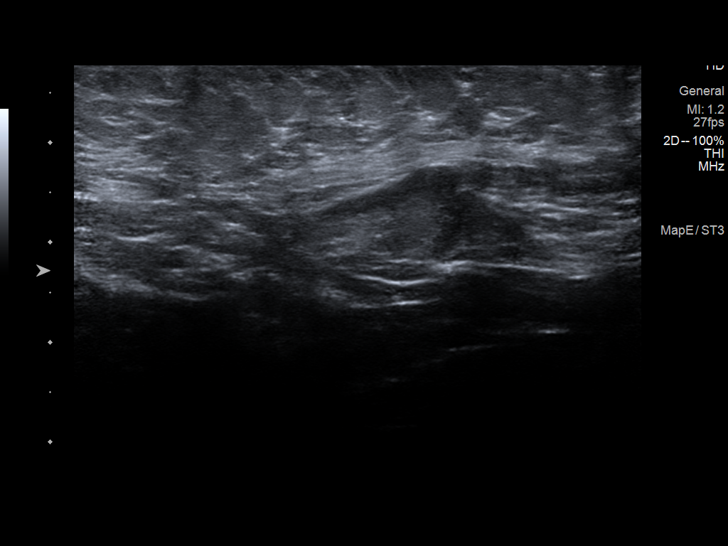

[10 of 10 positions shown; findings below may reference images not displayed]

ACR Breast Density Category c: The breast tissue is heterogeneously
dense, which may obscure small masses.
FINDINGS: A radiopaque BB was placed at the site of the patient's palpable
lump in the lower inner quadrant of the left breast. A spiculated
hyperdense mass with associated calcifications is seen deep to the
radiopaque BB. There is overlying skin and trabecular thickening of
the left breast. No additional suspicious findings on the left.

Within the right breast, there is a spiculated hyperdense mass in
the lower inner quadrant at anterior depth. Additionally, there is a
2 cm group of pleomorphic calcifications in the upper-outer quadrant
of the right breast. No additional suspicious findings on the right.

Targeted ultrasound is performed, showing an irregular, hypoechoic
mass with marked associated vascularity at the 9 o'clock position 3
cm from the nipple on the left. It measures 2.9 x 2.4 x 2.3 cm. This
correlates with the mammographic finding. Evaluation of the left
axilla demonstrates multiple lymph nodes with diffuse cortical
thickening up to 1.1 cm.

Evaluation of the right breast demonstrates an irregular hypoechoic
mass at the 6 o'clock position 3 cm from the nipple with associated
vascularity. It measures 1.3 x 1.0 by 1.1 cm. Evaluation of the
right axilla demonstrates a single lymph node with borderline
cortical thickening up to 3.5 mm
IMPRESSION: 1. Highly suspicious left breast mass consistent with the patient's
palpable lump at the 9 o'clock position. Recommendation is for
ultrasound-guided biopsy.
2. Overlying skin and trabecular thickening of the left breast,
suggesting lymphovascular spread of disease. Skin punch biopsy
should be performed if this will alter clinical management.
3. Suspicious left axillary lymphadenopathy with diffuse cortical
thickening up to 1.1 cm. Recommendation ultrasound-guided biopsy.
4. Suspicious right breast mass at the 6 o'clock position.
Recommendation is for ultrasound-guided biopsy.
5. 2 cm group of indeterminate calcifications in the upper-outer
quadrant of the right breast. Unfortunately, this patient is felt to
be a poor candidate for stereotactic biopsy given her markedly
limited mobility.
6. Borderline right axillary lymphadenopathy up to 3.5 mm.

RECOMMENDATION:
1. Three area ultrasound-guided biopsy of the bilateral breasts and
left axilla. This will be scheduled to follow.
2. Consider skin punch biopsy of diffuse left-sided skin thickening
if this will alter clinical management.
3. Stereotactic biopsy of indeterminate right breast calcifications
and borderline right axillary lymphadenopathy can be attempted at a
later date if this will alter clinical management.

I have discussed the findings and recommendations with the patient.
If applicable, a reminder letter will be sent to the patient
regarding the next appointment.

BI-RADS CATEGORY  5: Highly suggestive of malignancy.

## 2022-03-31 IMAGING — US US BREAST*L* LIMITED INC AXILLA
1 series · 12 of 17 positions shown · non-contrast
Comparison: None.

CLINICAL DATA: 75-year-old female with a palpable left breast lump.
The patient is in a wheelchair with markedly limited mobility. The
best possible images were obtained.



[Series 1: us breast*left* limited inc axilla · 0.06mm/px · 17 acquisitions, 12 frames shown]
[im 1/17]
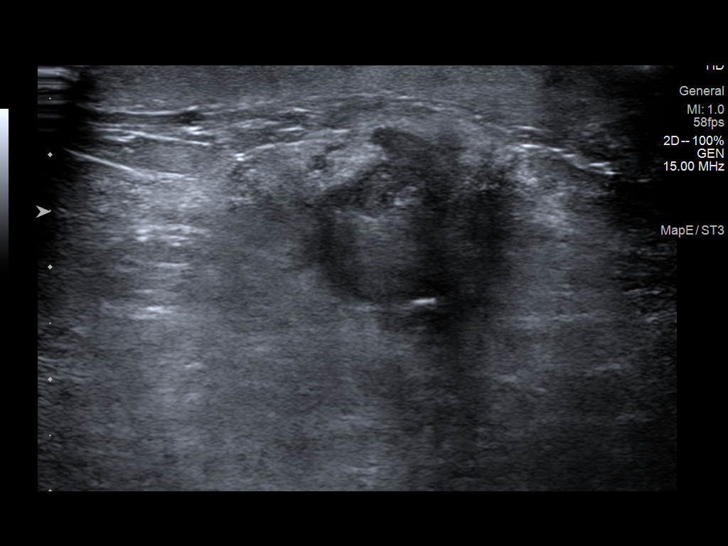
[im 3/17]
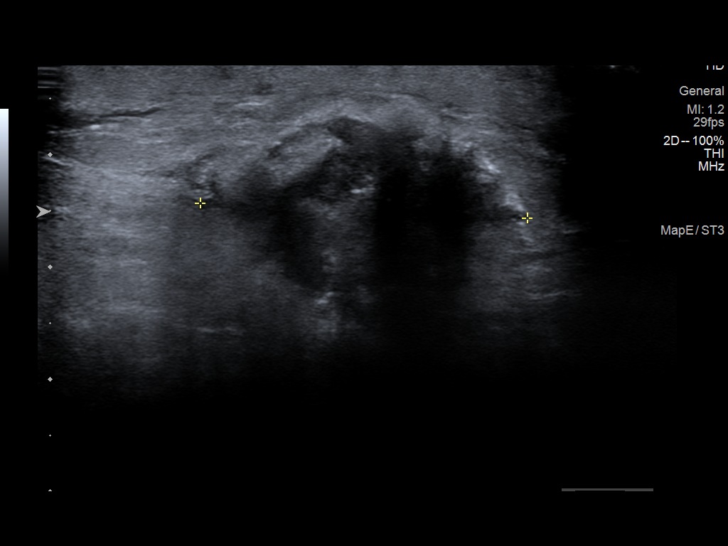
[im 4/17]
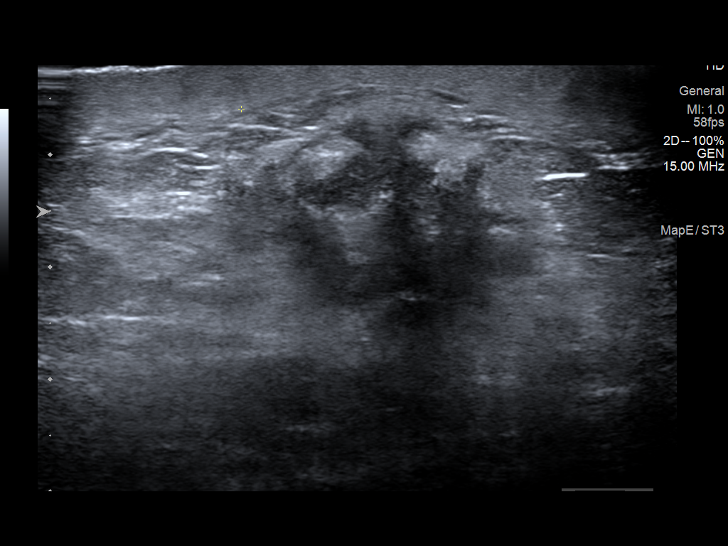
[im 5/17]
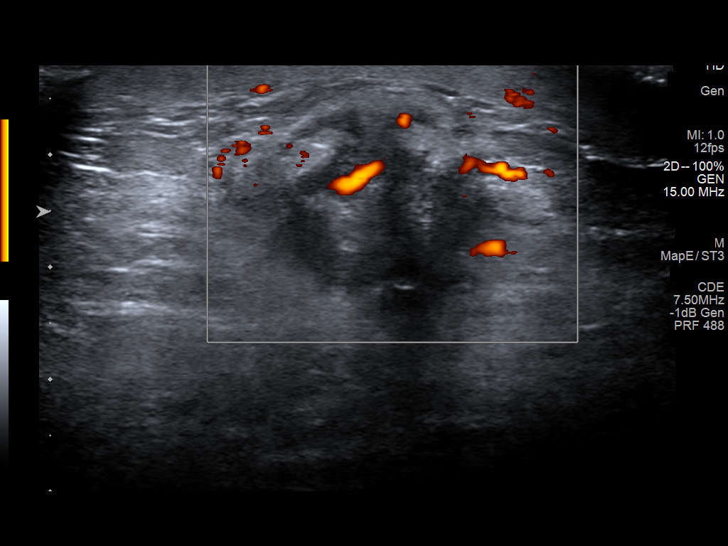
[im 7/17]
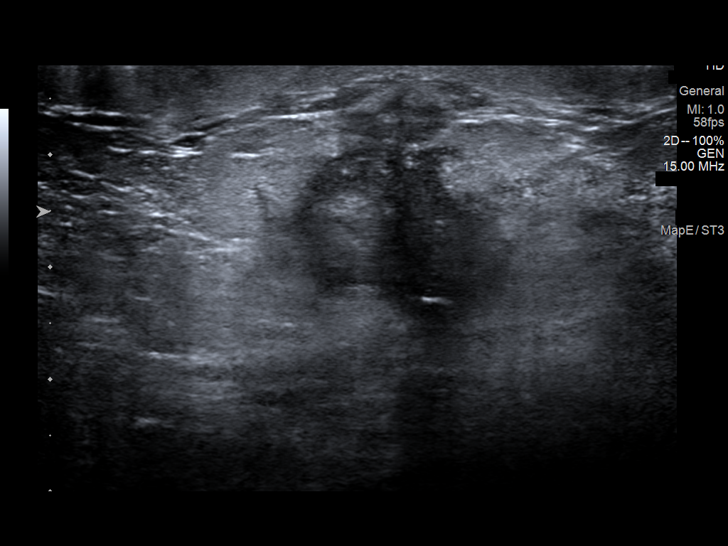
[im 8/17]
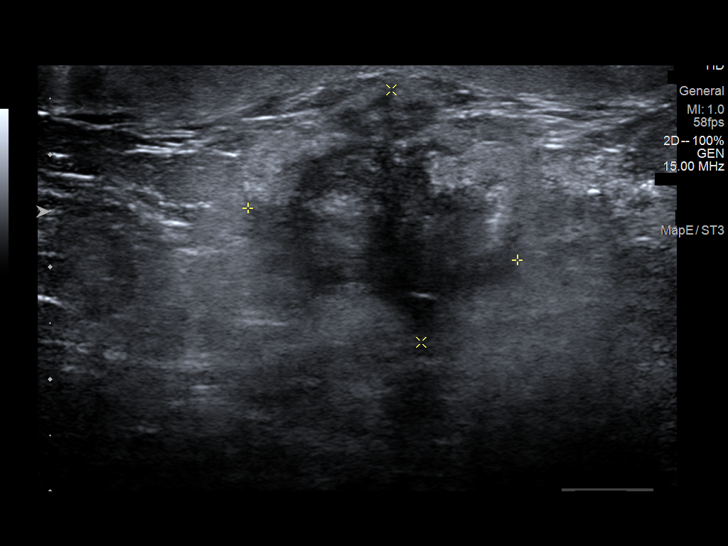
[im 10/17]
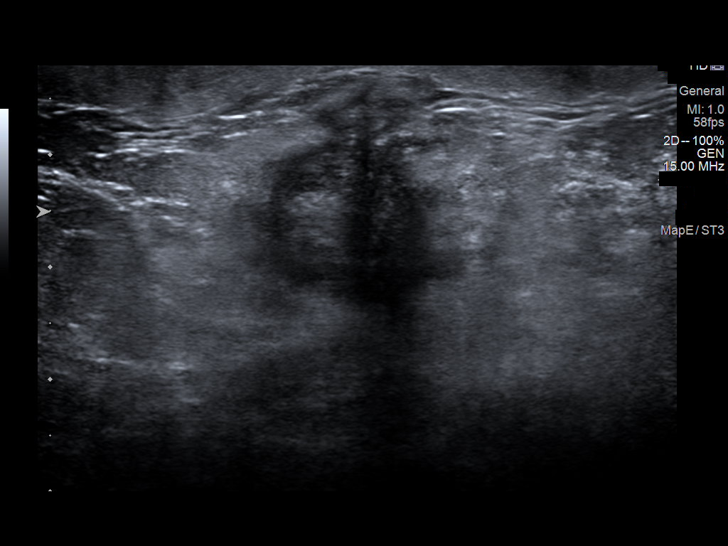
[im 11/17]
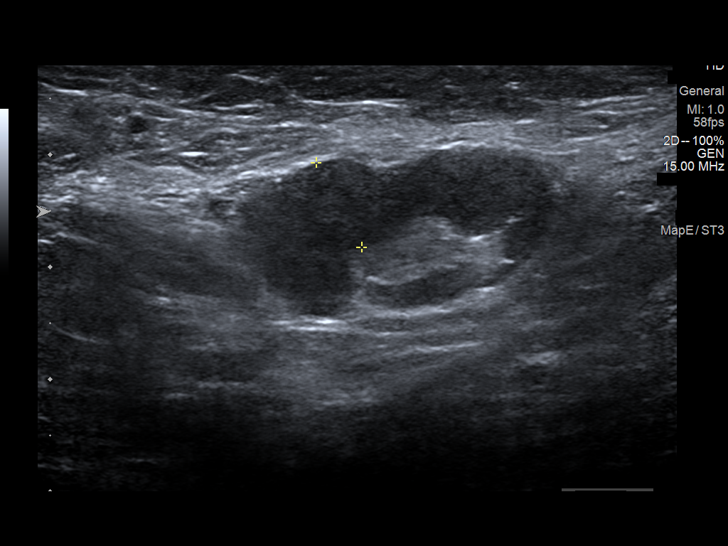
[im 13/17]
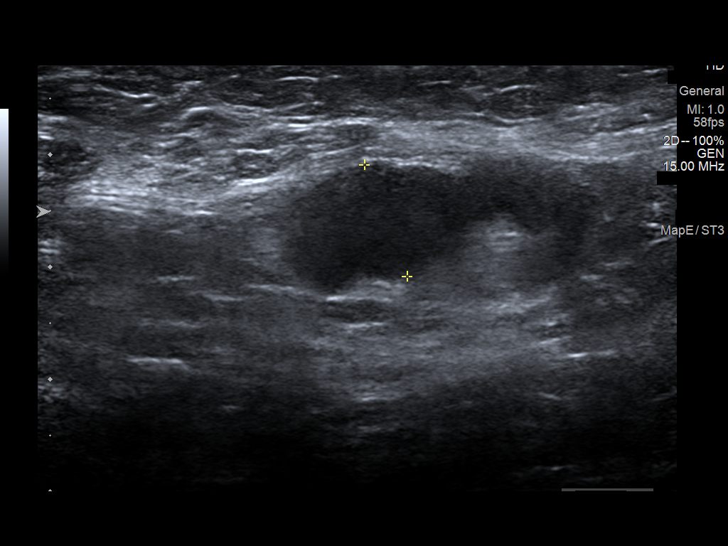
[im 14/17]
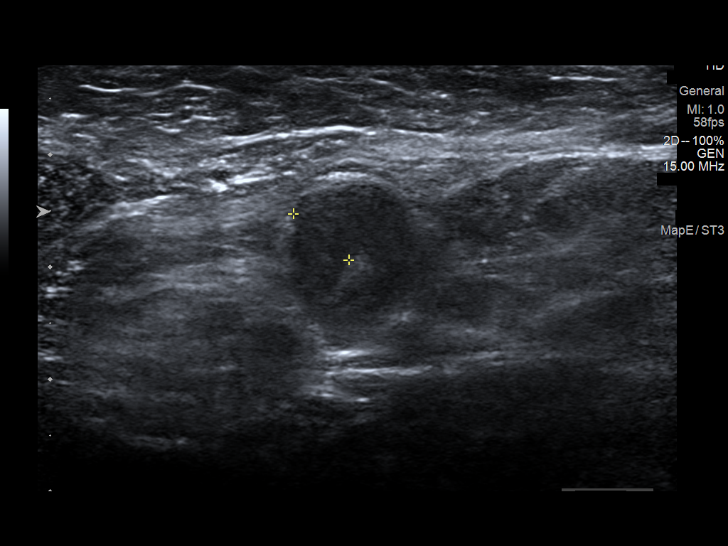
[im 15/17]
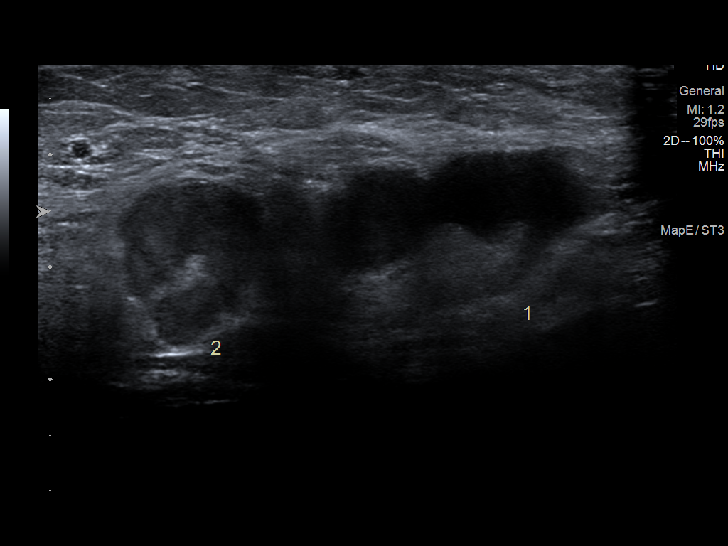
[im 17/17]
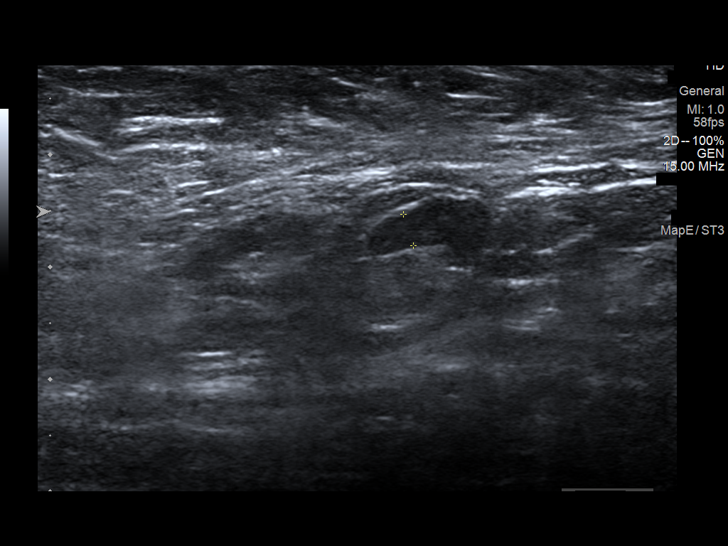

[12 of 17 positions shown; findings below may reference images not displayed]

ACR Breast Density Category c: The breast tissue is heterogeneously
dense, which may obscure small masses.
FINDINGS: A radiopaque BB was placed at the site of the patient's palpable
lump in the lower inner quadrant of the left breast. A spiculated
hyperdense mass with associated calcifications is seen deep to the
radiopaque BB. There is overlying skin and trabecular thickening of
the left breast. No additional suspicious findings on the left.

Within the right breast, there is a spiculated hyperdense mass in
the lower inner quadrant at anterior depth. Additionally, there is a
2 cm group of pleomorphic calcifications in the upper-outer quadrant
of the right breast. No additional suspicious findings on the right.

Targeted ultrasound is performed, showing an irregular, hypoechoic
mass with marked associated vascularity at the 9 o'clock position 3
cm from the nipple on the left. It measures 2.9 x 2.4 x 2.3 cm. This
correlates with the mammographic finding. Evaluation of the left
axilla demonstrates multiple lymph nodes with diffuse cortical
thickening up to 1.1 cm.

Evaluation of the right breast demonstrates an irregular hypoechoic
mass at the 6 o'clock position 3 cm from the nipple with associated
vascularity. It measures 1.3 x 1.0 by 1.1 cm. Evaluation of the
right axilla demonstrates a single lymph node with borderline
cortical thickening up to 3.5 mm
IMPRESSION: 1. Highly suspicious left breast mass consistent with the patient's
palpable lump at the 9 o'clock position. Recommendation is for
ultrasound-guided biopsy.
2. Overlying skin and trabecular thickening of the left breast,
suggesting lymphovascular spread of disease. Skin punch biopsy
should be performed if this will alter clinical management.
3. Suspicious left axillary lymphadenopathy with diffuse cortical
thickening up to 1.1 cm. Recommendation ultrasound-guided biopsy.
4. Suspicious right breast mass at the 6 o'clock position.
Recommendation is for ultrasound-guided biopsy.
5. 2 cm group of indeterminate calcifications in the upper-outer
quadrant of the right breast. Unfortunately, this patient is felt to
be a poor candidate for stereotactic biopsy given her markedly
limited mobility.
6. Borderline right axillary lymphadenopathy up to 3.5 mm.

RECOMMENDATION:
1. Three area ultrasound-guided biopsy of the bilateral breasts and
left axilla. This will be scheduled to follow.
2. Consider skin punch biopsy of diffuse left-sided skin thickening
if this will alter clinical management.
3. Stereotactic biopsy of indeterminate right breast calcifications
and borderline right axillary lymphadenopathy can be attempted at a
later date if this will alter clinical management.

I have discussed the findings and recommendations with the patient.
If applicable, a reminder letter will be sent to the patient
regarding the next appointment.

BI-RADS CATEGORY  5: Highly suggestive of malignancy.

## 2022-03-31 IMAGING — MG DIGITAL DIAGNOSTIC BILAT W/ TOMO W/ CAD
7 of 17 series · 7 of 40 positions shown · non-contrast
Comparison: None.

CLINICAL DATA: 75-year-old female with a palpable left breast lump.
The patient is in a wheelchair with markedly limited mobility. The
best possible images were obtained.



[R ML (1 of 2)]
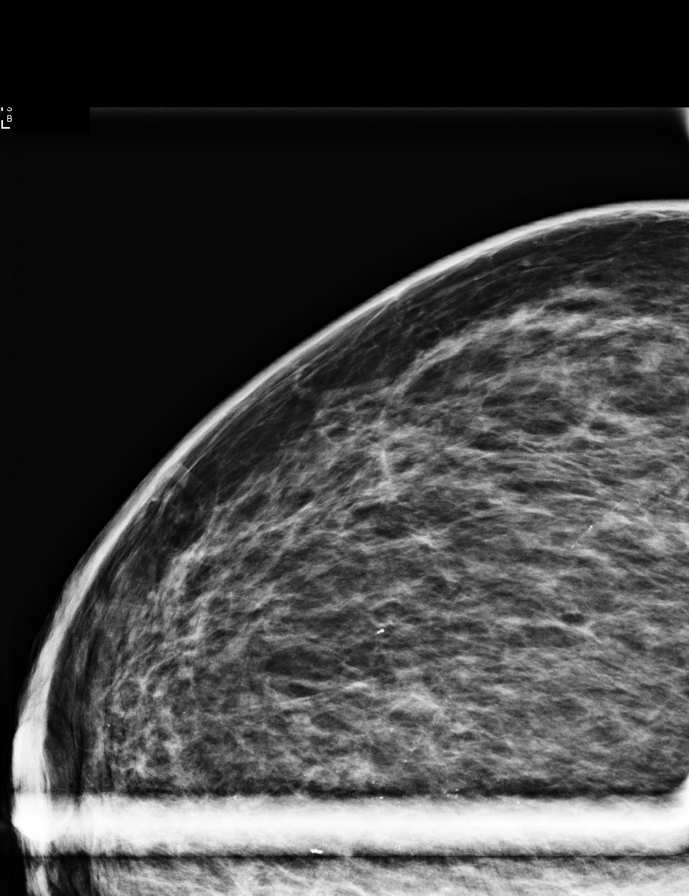

[R ML (2 of 2)]
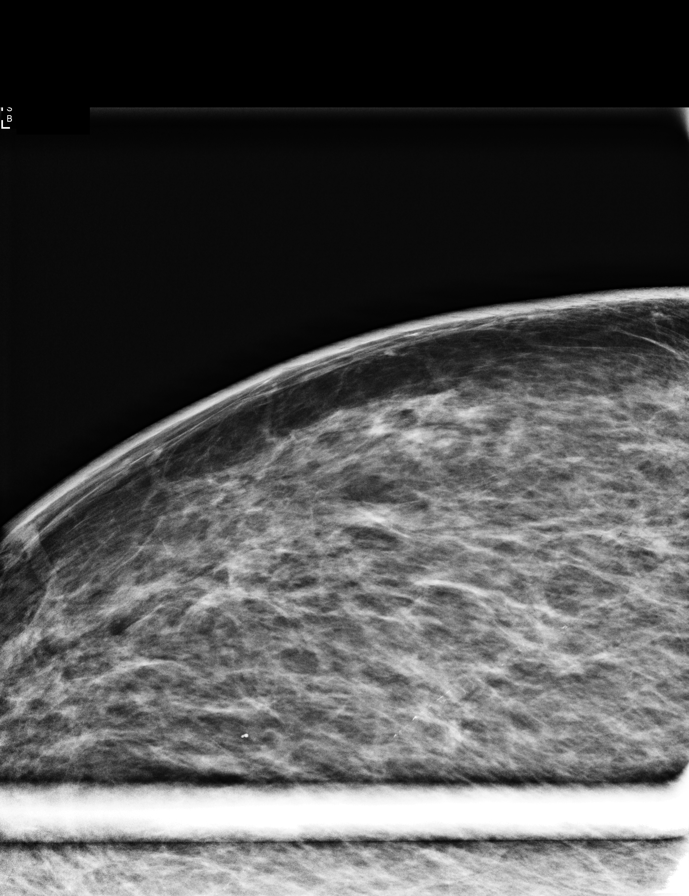

[L CC synth-2D]
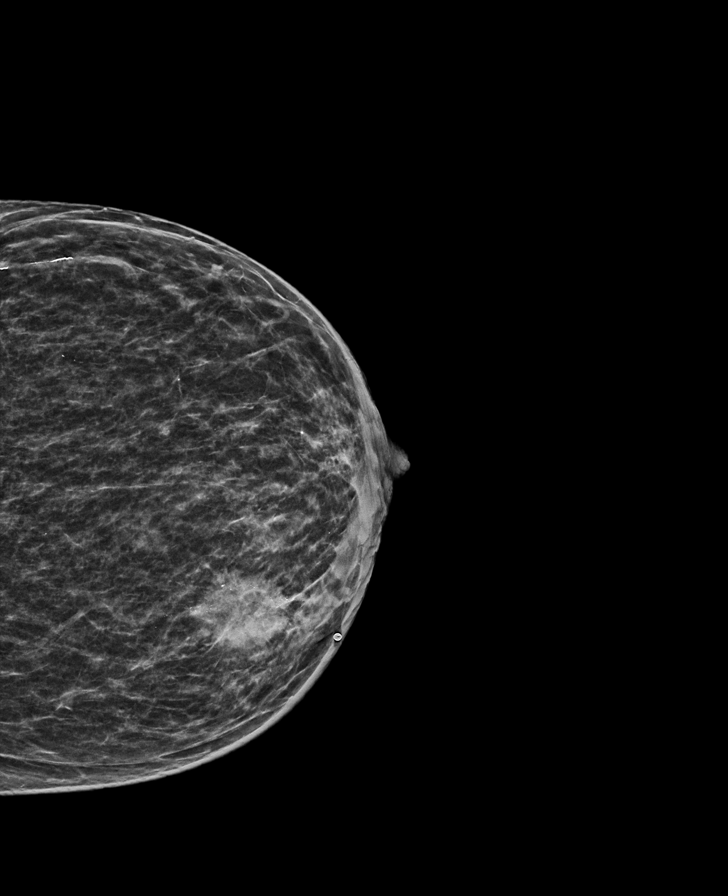

[R CC synth-2D]
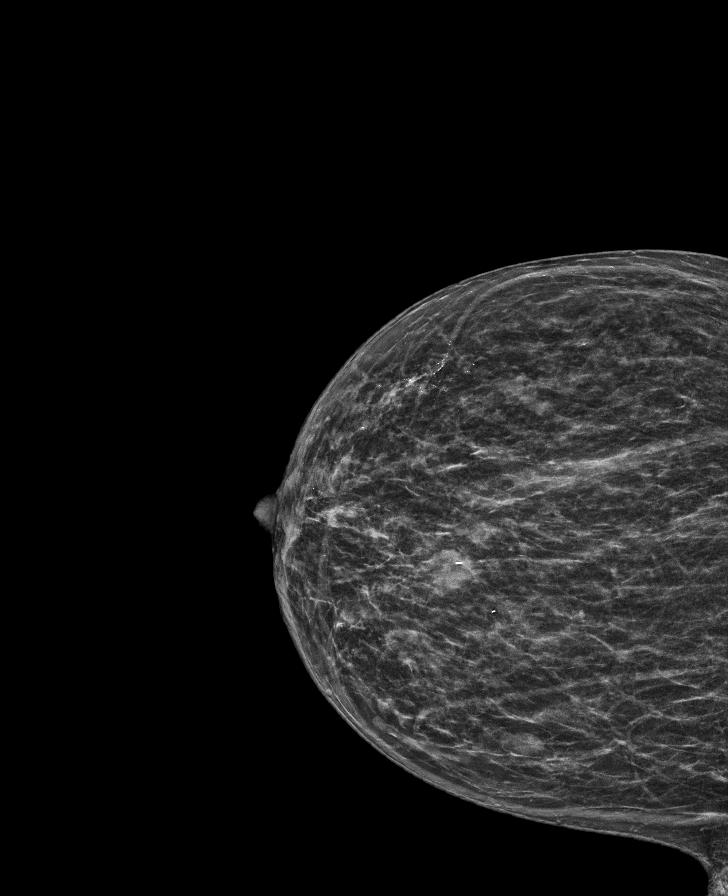

[L MLO synth-2D]
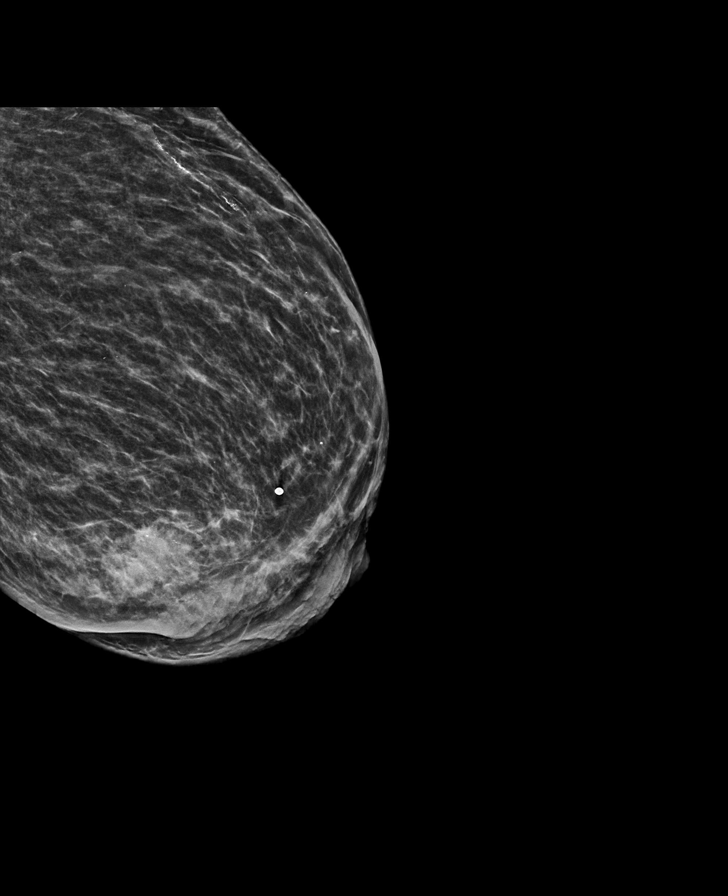

[R MLO synth-2D]
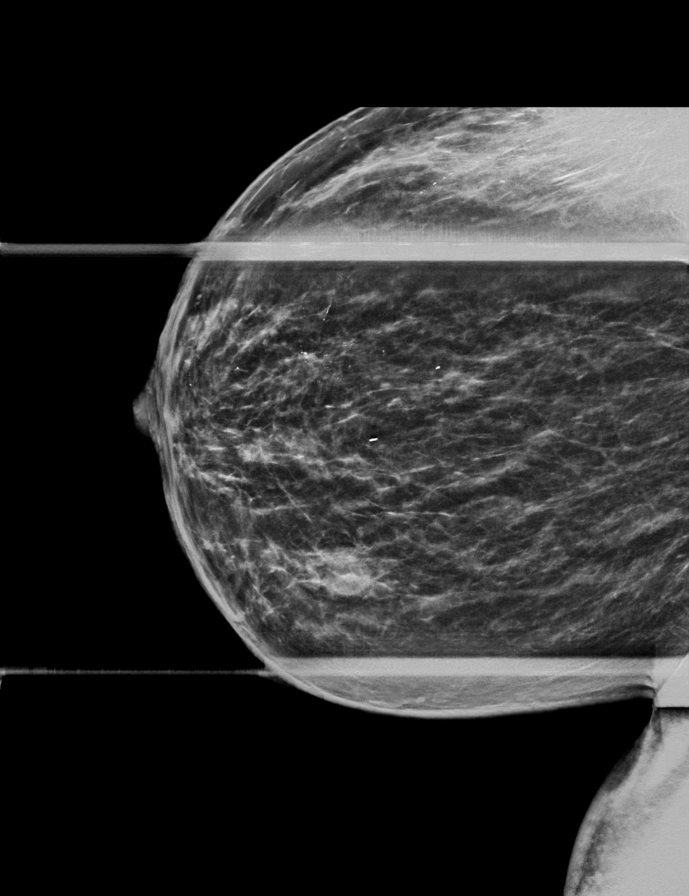

[R ML synth-2D]
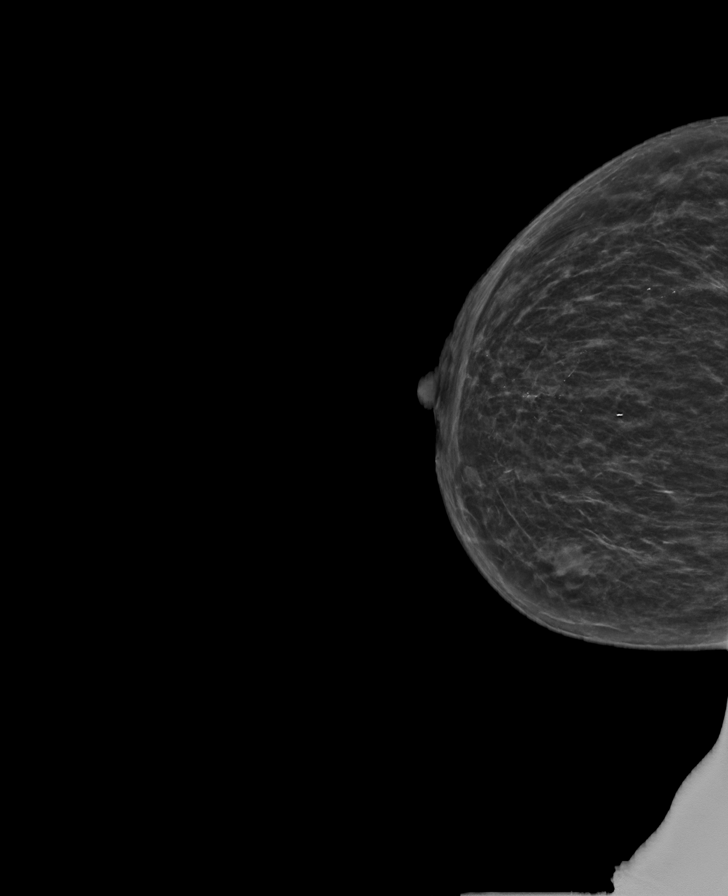

[7 of 40 positions shown; findings below may reference images not displayed]

ACR Breast Density Category c: The breast tissue is heterogeneously
dense, which may obscure small masses.
FINDINGS: A radiopaque BB was placed at the site of the patient's palpable
lump in the lower inner quadrant of the left breast. A spiculated
hyperdense mass with associated calcifications is seen deep to the
radiopaque BB. There is overlying skin and trabecular thickening of
the left breast. No additional suspicious findings on the left.

Within the right breast, there is a spiculated hyperdense mass in
the lower inner quadrant at anterior depth. Additionally, there is a
2 cm group of pleomorphic calcifications in the upper-outer quadrant
of the right breast. No additional suspicious findings on the right.

Targeted ultrasound is performed, showing an irregular, hypoechoic
mass with marked associated vascularity at the 9 o'clock position 3
cm from the nipple on the left. It measures 2.9 x 2.4 x 2.3 cm. This
correlates with the mammographic finding. Evaluation of the left
axilla demonstrates multiple lymph nodes with diffuse cortical
thickening up to 1.1 cm.

Evaluation of the right breast demonstrates an irregular hypoechoic
mass at the 6 o'clock position 3 cm from the nipple with associated
vascularity. It measures 1.3 x 1.0 by 1.1 cm. Evaluation of the
right axilla demonstrates a single lymph node with borderline
cortical thickening up to 3.5 mm
IMPRESSION: 1. Highly suspicious left breast mass consistent with the patient's
palpable lump at the 9 o'clock position. Recommendation is for
ultrasound-guided biopsy.
2. Overlying skin and trabecular thickening of the left breast,
suggesting lymphovascular spread of disease. Skin punch biopsy
should be performed if this will alter clinical management.
3. Suspicious left axillary lymphadenopathy with diffuse cortical
thickening up to 1.1 cm. Recommendation ultrasound-guided biopsy.
4. Suspicious right breast mass at the 6 o'clock position.
Recommendation is for ultrasound-guided biopsy.
5. 2 cm group of indeterminate calcifications in the upper-outer
quadrant of the right breast. Unfortunately, this patient is felt to
be a poor candidate for stereotactic biopsy given her markedly
limited mobility.
6. Borderline right axillary lymphadenopathy up to 3.5 mm.

RECOMMENDATION:
1. Three area ultrasound-guided biopsy of the bilateral breasts and
left axilla. This will be scheduled to follow.
2. Consider skin punch biopsy of diffuse left-sided skin thickening
if this will alter clinical management.
3. Stereotactic biopsy of indeterminate right breast calcifications
and borderline right axillary lymphadenopathy can be attempted at a
later date if this will alter clinical management.

I have discussed the findings and recommendations with the patient.
If applicable, a reminder letter will be sent to the patient
regarding the next appointment.

BI-RADS CATEGORY  5: Highly suggestive of malignancy.

## 2022-04-03 ENCOUNTER — Ambulatory Visit (INDEPENDENT_AMBULATORY_CARE_PROVIDER_SITE_OTHER): Payer: Medicare Other

## 2022-04-03 ENCOUNTER — Telehealth: Payer: Self-pay

## 2022-04-03 VITALS — Ht 63.0 in | Wt 181.0 lb

## 2022-04-03 DIAGNOSIS — Z0000 Encounter for general adult medical examination without abnormal findings: Secondary | ICD-10-CM

## 2022-04-03 NOTE — Progress Notes (Signed)
I connected with Sheila Perez today by telephone and verified that I am speaking with the correct person using two identifiers. Location patient: home Location provider: work Persons participating in the virtual visit: Samhitha Pierrette, Scheu (daughter), Glenna Durand LPN.   I discussed the limitations, risks, security and privacy concerns of performing an evaluation and management service by telephone and the availability of in person appointments. I also discussed with the patient that there may be a patient responsible charge related to this service. The patient expressed understanding and verbally consented to this telephonic visit.    Interactive audio and video telecommunications were attempted between this provider and patient, however failed, due to patient having technical difficulties OR patient did not have access to video capability.  We continued and completed visit with audio only.     Vital signs may be patient reported or missing.  Subjective:   [name] is a 76 y.o. female who presents for Medicare Annual (Subsequent) preventive examination.  Review of Systems     Cardiac Risk Factors include: advanced age (>80mn, >>72women);dyslipidemia;hypertension;obesity (BMI >30kg/m2)     Objective:    Today's Vitals   04/03/22 1532  Weight: 181 lb (82.1 kg)  Height: '5\' 3"'$  (1.6 m)   Body mass index is 32.06 kg/m.     04/03/2022    3:38 PM 11/06/2021   10:46 PM 08/12/2021    8:40 AM 08/10/2021   11:36 AM 04/07/2021    2:17 PM 03/29/2021    8:29 AM 02/09/2021    9:10 AM  Advanced Directives  Does Patient Have a Medical Advance Directive? Yes No No No No No;Unable to assess, patient is non-responsive or altered mental status No  Type of AParamedicof ASan MarineLiving will        Copy of HFort Plainin Chart? No - copy requested        Would patient like information on creating a medical advance directive?  No -  Patient declined   Yes (MAU/Ambulatory/Procedural Areas - Information given)      Current Medications (verified) Outpatient Encounter Medications as of 04/03/2022  Medication Sig   acetaminophen (TYLENOL) 500 MG tablet Take 500 mg by mouth every 6 (six) hours as needed for mild pain.   amLODipine (NORVASC) 10 MG tablet Take 1 tablet (10 mg total) by mouth daily.   atorvastatin (LIPITOR) 40 MG tablet Take 1 tablet (40 mg total) by mouth daily.   cholecalciferol (VITAMIN D3) 25 MCG (1000 UNIT) tablet Take 1,000 Units by mouth daily.   co-enzyme Q-10 30 MG capsule Take 30 mg by mouth daily.   omega-3 acid ethyl esters (LOVAZA) 1 g capsule Take 1 g by mouth 2 (two) times daily.   No facility-administered encounter medications on file as of 04/03/2022.    Allergies (verified) Patient has no known allergies.   History: Past Medical History:  Diagnosis Date   Blood transfusion without reported diagnosis    Diabetes mellitus without complication ([facility]    prediabetic-no medications   Gait abnormality    Hyperlipidemia    Hypertension    Neuromuscular disorder ([place]    never damage for the way she walks   Stroke ([facility]    mimi   Past Surgical History:  Procedure Laterality Date   ABDOMINAL HYSTERECTOMY     maxilo facial surgery     Family History  Problem Relation Age of Onset   Kidney disease Mother    Diabetes Mother  Alcohol abuse Father    Diabetes Sister    Diabetes Sister    Diabetes Daughter    Colon cancer Neg Hx    Colon polyps Neg Hx    Heart disease Neg Hx    Rectal cancer Neg Hx    Stomach cancer Neg Hx    Social History   Socioeconomic History   Marital status: Widowed    Spouse name: Not on file   Number of children: 2   Years of education: 10   Highest education level: 10th grade  Occupational History   Occupation: Retired  Tobacco Use   Smoking status: Never   Smokeless tobacco: Never  Vaping Use   Vaping Use: Never used  Substance and Sexual  Activity   Alcohol use: No   Drug use: No   Sexual activity: Not Currently  Other Topics Concern   Not on file  Social History Narrative   ** Merged History Encounter **       Right handed Lives daughter in a two story home but lives on first floor Drinks no caffeine    Social Determinants of Health   Financial Resource Strain: Low Risk    Difficulty of Paying Living Expenses: Not hard at all  Food Insecurity: No Food Insecurity   Worried About Charity fundraiser in the Last Year: Never true   Arboriculturist in the Last Year: Never true  Transportation Needs: No Transportation Needs   Lack of Transportation (Medical): No   Lack of Transportation (Non-Medical): No  Physical Activity: Inactive   Days of Exercise per Week: 0 days   Minutes of Exercise per Session: 0 min  Stress: No Stress Concern Present   Feeling of Stress : Not at all  Social Connections: Socially Isolated   Frequency of Communication with Friends and Family: More than three times a week   Frequency of Social Gatherings with Friends and Family: More than three times a week   Attends Religious Services: Never   Marine scientist or Organizations: No   Attends Archivist Meetings: Never   Marital Status: Widowed    Tobacco Counseling Counseling given: Not Answered   Clinical Intake:  Pre-visit preparation completed: Yes  Pain : No/denies pain     Nutritional Status: BMI > 30  Obese Diabetes: No  How often do you need to have someone help you when you read instructions, pamphlets, or other written materials from your doctor or pharmacy?: 3 - Sometimes  Diabetic?no  Interpreter Needed?: No  Information entered by :: NAllen LPN   Activities of Daily Living    04/03/2022    3:43 PM 04/19/2021    9:46 AM  In your present state of health, do you have any difficulty performing the following activities:  Hearing? 0 0  Vision? 1 0  Difficulty concentrating or making decisions? 1  0  Walking or climbing stairs? 1 1  Comment  Unsteady balance/gait/generalized weakness.  Dressing or bathing? 1 1  Comment  Unsteady balance/gait/generalized weakness.  Doing errands, shopping? 1 1  Comment  Unsteady balance/gait/generalized weakness.  Preparing Food and eating ? Y Y  Comment does not prepare meals Unsteady balance/gait/generalized weakness.  Using the Toilet? Y N  In the past six months, have you accidently leaked urine? Y Y  Comment  Urinary Incontinence.  Do you have problems with loss of bowel control? Y N  Managing your Medications? Y N  Managing your Finances? Aggie Moats  Housekeeping or managing your Housekeeping? Y N    Patient Care Team: [name], [name], MD as PCP - General (Family Medicine) Pieter Partridge, DO as Consulting Physician (Neurology) [name], RN as Case Manager Saporito, Maree Erie, LCSW as Social Worker (Licensed Clinical Social Worker) [name], [name], MD (Family Medicine)  Indicate any recent [place] you may have received from other than [facility] providers in the past year (date may be approximate).     Assessment:   This is a routine wellness examination for TMLYYTKP.  Hearing/Vision screen Vision Screening - Comments:: Regular eye exams, [place] [facility]  Dietary issues and exercise activities discussed: Current Exercise Habits: The patient does not participate in regular exercise at present   Goals Addressed             This Visit's Progress    Patient Stated       04/03/2022, daughter wants her to move more       Depression Screen    04/03/2022    3:41 PM 03/17/2022    3:56 PM 04/19/2021    9:46 AM 04/07/2021    2:13 PM 03/29/2021    8:30 AM 09/09/2020    8:44 AM 03/29/2020    8:44 AM  PHQ 2/9 Scores  PHQ - 2 Score 2 0 0 0 0 0 0    Fall Risk    04/03/2022    3:40 PM 03/17/2022    3:56 PM 08/12/2021    8:38 AM 07/29/2021    4:04 PM 04/19/2021    9:46 AM  Fall Risk   Falls in the past year? 1 0 '1 1 1  '$ Comment  lost balance due to weak legs      Number falls in past yr: 1 0 '1 1 1  '$ Comment    3 falls recently   Injury with Fall? 0 0 1 0 0  Risk for fall due to : Impaired mobility;Medication side effect;Impaired balance/gait No Fall Risks  History of fall(s);Impaired balance/gait;Impaired mobility History of fall(s);Impaired balance/gait;Impaired mobility  Follow up Falls evaluation completed;Education provided;Falls prevention discussed Falls evaluation completed  Education provided;Falls prevention discussed Falls evaluation completed;Education provided;Falls prevention discussed    FALL RISK PREVENTION PERTAINING TO THE HOME:  Any stairs in or around the home? No  If so, are there any without handrails? N/a Home free of loose throw rugs in walkways, pet beds, electrical cords, etc? Yes  Adequate lighting in your home to reduce risk of falls? Yes   ASSISTIVE DEVICES UTILIZED TO PREVENT FALLS:  Life alert? No  Use of a cane, walker or w/c? Yes  Grab bars in the bathroom? No  Shower chair or bench in shower? Yes  Elevated toilet seat or a handicapped toilet? No   TIMED UP AND GO:  Was the test performed? No .       Cognitive Function:        Immunizations  There is no immunization history on file for this patient.  TDAP status: Due, Education has been provided regarding the importance of this vaccine. Advised may receive this vaccine at local pharmacy or Health Dept. Aware to provide a copy of the vaccination record if obtained from local pharmacy or Health Dept. Verbalized acceptance and understanding.  Flu Vaccine status: Declined, Education has been provided regarding the importance of this vaccine but patient still declined. Advised may receive this vaccine at local pharmacy or Health Dept. Aware to provide a copy of the vaccination record if  obtained from local pharmacy or Health Dept. Verbalized acceptance and understanding.  Pneumococcal vaccine status: Declined,  Education  has been provided regarding the importance of this vaccine but patient still declined. Advised may receive this vaccine at local pharmacy or Health Dept. Aware to provide a copy of the vaccination record if obtained from local pharmacy or Health Dept. Verbalized acceptance and understanding.   Covid-19 vaccine status: Declined, Education has been provided regarding the importance of this vaccine but patient still declined. Advised may receive this vaccine at local pharmacy or Health Dept.or vaccine clinic. Aware to provide a copy of the vaccination record if obtained from local pharmacy or Health Dept. Verbalized acceptance and understanding.  Qualifies for Shingles Vaccine? Yes   Zostavax completed No   Shingrix Completed?: No.    Education has been provided regarding the importance of this vaccine. Patient has been advised to call insurance company to determine out of pocket expense if they have not yet received this vaccine. Advised may also receive vaccine at local pharmacy or Health Dept. Verbalized acceptance and understanding.  Screening Tests Health Maintenance  Topic Date Due   DEXA SCAN  Never done   MAMMOGRAM  03/31/2024   Hepatitis C Screening  Completed   HPV VACCINES  Aged Out   Pneumonia Vaccine 62+ Years old  Discontinued   INFLUENZA VACCINE  Discontinued   COLONOSCOPY (Pts 45-67yr Insurance coverage will need to be confirmed)  Discontinued   TETANUS/TDAP  Discontinued   COVID-19 Vaccine  Discontinued   Zoster Vaccines- Shingrix  Discontinued    Health Maintenance  Health Maintenance Due  Topic Date Due   DEXA SCAN  Never done    Colorectal cancer screening: Type of screening: Colonoscopy. Completed 12/07/2021. Repeat every 5 years  Mammogram status: Completed 03/31/2022. Repeat every year  Bone Density status: due  Lung Cancer Screening: (Low Dose CT Chest recommended if Age 74-80years, 30 pack-year currently smoking OR have quit w/in 15years.) does not qualify.    Lung Cancer Screening Referral: no  Additional Screening:  Hepatitis C Screening: does qualify; Completed 12/05/2019  Vision Screening: Recommended annual ophthalmology exams for early detection of glaucoma and other disorders of the eye. Is the patient up to date with their annual eye exam?  Yes  Who is the provider or what is the name of the office in which the patient attends annual eye exams? [facility] is not established with a provider, would they like to be referred to a provider to establish care? No .   Dental Screening: Recommended annual dental exams for proper oral hygiene  Community Resource Referral / Chronic Care Management: CRR required this visit?  No   CCM required this visit?  No      Plan:     I have personally reviewed and noted the following in the patient's chart:   Medical and social history Use of alcohol, tobacco or illicit drugs  Current medications and supplements including opioid prescriptions.  Functional ability and status Nutritional status Physical activity Advanced directives List of other physicians Hospitalizations, surgeries, and ER visits in previous 12 months Vitals Screenings to include cognitive, depression, and falls Referrals and appointments  In addition, I have reviewed and discussed with patient certain preventive protocols, quality metrics, and best practice recommendations. A written personalized care plan for preventive services as well as general preventive health recommendations were provided to patient.     NKellie Simmering LPN   [id number]  Nurse Notes:  daughter answered questions. Daughter states patient has trouble with memory. 6 CIT not administered.  Due to this being a virtual visit, the after visit summary with patients personalized plan was offered to patient via mail or my-chart. Will pick up at office at next visit

## 2022-04-03 NOTE — Telephone Encounter (Signed)
This nurse called three times for telephonic AWV. Message left that we will call again to reschedule.

## 2022-04-03 NOTE — Patient Instructions (Signed)
Sheila Perez , Thank you for taking time to come for your Medicare Wellness Visit. I appreciate your ongoing commitment to your health goals. Please review the following plan we discussed and let me know if I can assist you in the future.   Screening recommendations/referrals: Colonoscopy: completed 12/07/2021, due 12/07/2026 Mammogram: completed 03/31/2022 Bone Density: due Recommended yearly ophthalmology/optometry visit for glaucoma screening and checkup Recommended yearly dental visit for hygiene and checkup  Vaccinations: Influenza vaccine: declines Pneumococcal vaccine: declines Tdap vaccine: declines Shingles vaccine: declines   Covid-19:declines  Advanced directives: Please bring a copy of your POA (Power of Attorney) and/or Living Will to your next appointment.   Conditions/risks identified: none  Next appointment: Follow up in one year for your annual wellness visit    Preventive Care 65 Years and Older, Female Preventive care refers to lifestyle choices and visits with your health care provider that can promote health and wellness. What does preventive care include? A yearly physical exam. This is also called an annual well check. Dental exams once or twice a year. Routine eye exams. Ask your health care provider how often you should have your eyes checked. Personal lifestyle choices, including: Daily care of your teeth and gums. Regular physical activity. Eating a healthy diet. Avoiding tobacco and drug use. Limiting alcohol use. Practicing safe sex. Taking low-dose aspirin every day. Taking vitamin and mineral supplements as recommended by your health care provider. What happens during an annual well check? The services and screenings done by your health care provider during your annual well check will depend on your age, overall health, lifestyle risk factors, and family history of disease. Counseling  Your health care provider may ask you questions about  your: Alcohol use. Tobacco use. Drug use. Emotional well-being. Home and relationship well-being. Sexual activity. Eating habits. History of falls. Memory and ability to understand (cognition). Work and work Statistician. Reproductive health. Screening  You may have the following tests or measurements: Height, weight, and BMI. Blood pressure. Lipid and cholesterol levels. These may be checked every 5 years, or more frequently if you are over 74 years old. Skin check. Lung cancer screening. You may have this screening every year starting at age 74 if you have a 30-pack-year history of smoking and currently smoke or have quit within the past 15 years. Fecal occult blood test (FOBT) of the stool. You may have this test every year starting at age 74. Flexible sigmoidoscopy or colonoscopy. You may have a sigmoidoscopy every 5 years or a colonoscopy every 10 years starting at age 74. Hepatitis C blood test. Hepatitis B blood test. Sexually transmitted disease (STD) testing. Diabetes screening. This is done by checking your blood sugar (glucose) after you have not eaten for a while (fasting). You may have this done every 1-3 years. Bone density scan. This is done to screen for osteoporosis. You may have this done starting at age 74. Mammogram. This may be done every 1-2 years. Talk to your health care provider about how often you should have regular mammograms. Talk with your health care provider about your test results, treatment options, and if necessary, the need for more tests. Vaccines  Your health care provider may recommend certain vaccines, such as: Influenza vaccine. This is recommended every year. Tetanus, diphtheria, and acellular pertussis (Tdap, Td) vaccine. You may need a Td booster every 10 years. Zoster vaccine. You may need this after age 74. Pneumococcal 13-valent conjugate (PCV13) vaccine. One dose is recommended after age 74. Pneumococcal polysaccharide (  PPSV23) vaccine.  One dose is recommended after age 74. Talk to your health care provider about which screenings and vaccines you need and how often you need them. This information is not intended to replace advice given to you by your health care provider. Make sure you discuss any questions you have with your health care provider. Document Released: 11/26/2015 Document Revised: 07/19/2016 Document Reviewed: 08/31/2015 Elsevier Interactive Patient Education  2017 [employer] Prevention in the Home Falls can cause injuries. They can happen to people of all ages. There are many things you can do to make your home safe and to help prevent falls. What can I do on the outside of my home? Regularly fix the edges of walkways and driveways and fix any cracks. Remove anything that might make you trip as you walk through a door, such as a raised step or threshold. Trim any bushes or trees on the path to your home. Use bright outdoor lighting. Clear any walking paths of anything that might make someone trip, such as rocks or tools. Regularly check to see if handrails are loose or broken. Make sure that both sides of any steps have handrails. Any raised decks and porches should have guardrails on the edges. Have any leaves, snow, or ice cleared regularly. Use sand or salt on walking paths during winter. Clean up any spills in your garage right away. This includes oil or grease spills. What can I do in the bathroom? Use night lights. Install grab bars by the toilet and in the tub and shower. Do not use towel bars as grab bars. Use non-skid mats or decals in the tub or shower. If you need to sit down in the shower, use a plastic, non-slip stool. Keep the floor dry. Clean up any water that spills on the floor as soon as it happens. Remove soap buildup in the tub or shower regularly. Attach bath mats securely with double-sided non-slip rug tape. Do not have throw rugs and other things on the floor that can make  you trip. What can I do in the bedroom? Use night lights. Make sure that you have a light by your bed that is easy to reach. Do not use any sheets or blankets that are too big for your bed. They should not hang down onto the floor. Have a firm chair that has side arms. You can use this for support while you get dressed. Do not have throw rugs and other things on the floor that can make you trip. What can I do in the kitchen? Clean up any spills right away. Avoid walking on wet floors. Keep items that you use a lot in easy-to-reach places. If you need to reach something above you, use a strong step stool that has a grab bar. Keep electrical cords out of the way. Do not use floor polish or wax that makes floors slippery. If you must use wax, use non-skid floor wax. Do not have throw rugs and other things on the floor that can make you trip. What can I do with my stairs? Do not leave any items on the stairs. Make sure that there are handrails on both sides of the stairs and use them. Fix handrails that are broken or loose. Make sure that handrails are as long as the stairways. Check any carpeting to make sure that it is firmly attached to the stairs. Fix any carpet that is loose or worn. Avoid having throw rugs at the top or  bottom of the stairs. If you do have throw rugs, attach them to the floor with carpet tape. Make sure that you have a light switch at the top of the stairs and the bottom of the stairs. If you do not have them, ask someone to add them for you. What else can I do to help prevent falls? Wear shoes that: Do not have high heels. Have rubber bottoms. Are comfortable and fit you well. Are closed at the toe. Do not wear sandals. If you use a stepladder: Make sure that it is fully opened. Do not climb a closed stepladder. Make sure that both sides of the stepladder are locked into place. Ask someone to hold it for you, if possible. Clearly mark and make sure that you can  see: Any grab bars or handrails. First and last steps. Where the edge of each step is. Use tools that help you move around (mobility aids) if they are needed. These include: Canes. Walkers. Scooters. Crutches. Turn on the lights when you go into a dark area. Replace any light bulbs as soon as they burn out. Set up your furniture so you have a clear path. Avoid moving your furniture around. If any of your floors are uneven, fix them. If there are any pets around you, be aware of where they are. Review your medicines with your doctor. Some medicines can make you feel dizzy. This can increase your chance of falling. Ask your doctor what other things that you can do to help prevent falls. This information is not intended to replace advice given to you by your health care provider. Make sure you discuss any questions you have with your health care provider. Document Released: 08/26/2009 Document Revised: 04/06/2016 Document Reviewed: 12/04/2014 Elsevier Interactive Patient Education  2017 [employer].

## 2022-04-04 ENCOUNTER — Ambulatory Visit
Admission: RE | Admit: 2022-04-04 | Discharge: 2022-04-04 | Disposition: A | Discharge status: Home / Self Care | Payer: Medicare Other | Source: Ambulatory Visit | Attending: Family Medicine | Admitting: Family Medicine

## 2022-04-04 ENCOUNTER — Other Ambulatory Visit: Payer: Self-pay | Admitting: Family Medicine

## 2022-04-04 DIAGNOSIS — N6320 Unspecified lump in the left breast, unspecified quadrant: Secondary | ICD-10-CM

## 2022-04-04 DIAGNOSIS — R599 Enlarged lymph nodes, unspecified: Secondary | ICD-10-CM

## 2022-04-04 DIAGNOSIS — N6310 Unspecified lump in the right breast, unspecified quadrant: Secondary | ICD-10-CM

## 2022-04-04 DIAGNOSIS — C50812 Malignant neoplasm of overlapping sites of left female breast: Secondary | ICD-10-CM | POA: Diagnosis not present

## 2022-04-04 DIAGNOSIS — C773 Secondary and unspecified malignant neoplasm of axilla and upper limb lymph nodes: Secondary | ICD-10-CM | POA: Diagnosis not present

## 2022-04-04 DIAGNOSIS — C50811 Malignant neoplasm of overlapping sites of right female breast: Secondary | ICD-10-CM | POA: Diagnosis not present

## 2022-04-04 IMAGING — US US BREAST BX W LOC DEV 1ST LESION IMG BX SPEC US GUIDE*L*
1 series · 14 of 18 positions shown · non-contrast
Comparison: Prior studies
COMPARISON: Prior studies

Addendum:
CLINICAL DATA: 75-year-old female for tissue sampling of 1.3 cm
LOWER RIGHT breast mass, abnormal RIGHT axillary lymph node, 2.9 cm
INNER LEFT breast mass and abnormal LEFT axillary lymph node.

EXAM:
ULTRASOUND GUIDED RIGHT BREAST CORE NEEDLE BIOPSY
US AXILLARY NODE CORE BIOPSY RIGHT
ULTRASOUND GUIDED LEFT BREAST CORE NEEDLE BIOPSY
US AXILLARY NODE CORE BIOPSY LEFT

[Series 1: us breast bx w loc dev 1st lesion img bx spec us g · 0.07mm/px · 14 of 18 slices shown]
[im 1/18]
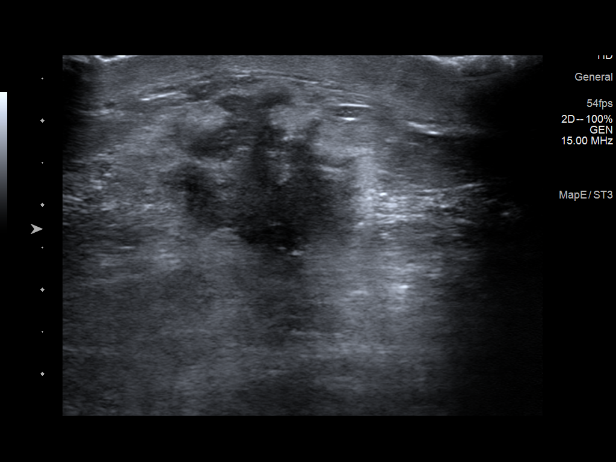
[im 2/18]
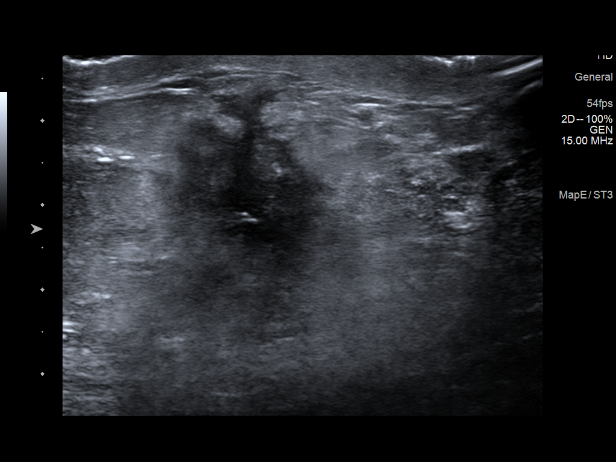
[im 4/18]
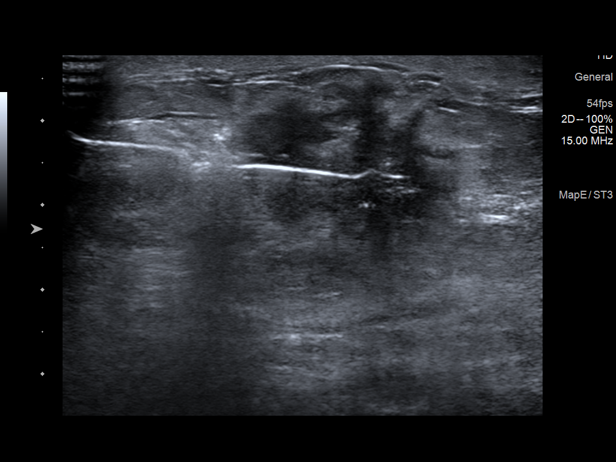
[im 5/18]
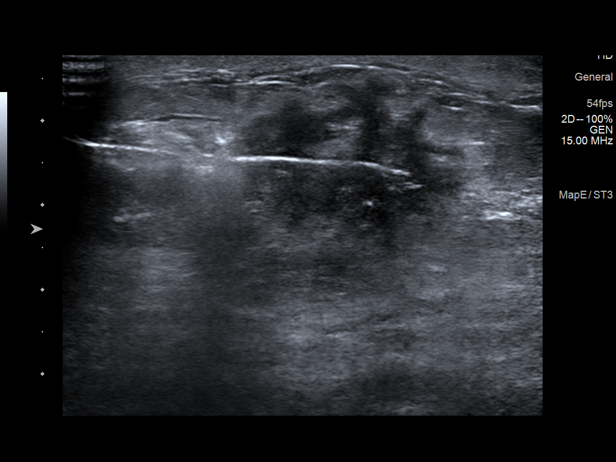
[im 6/18]
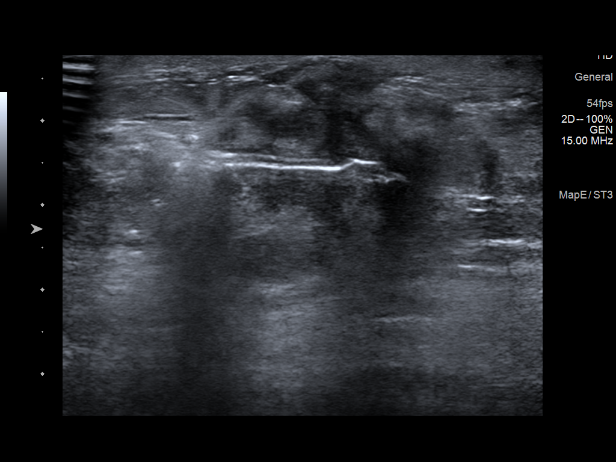
[im 8/18]
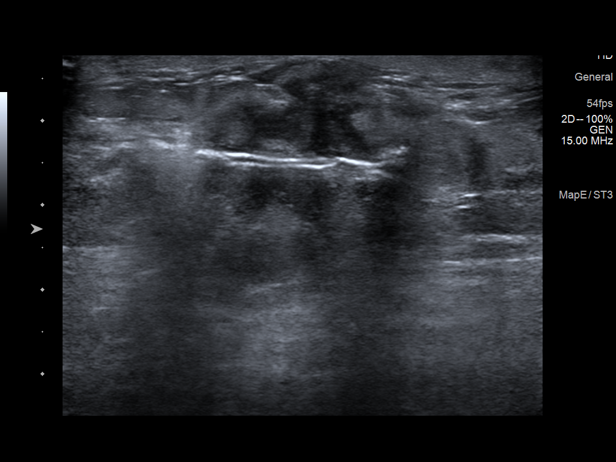
[im 9/18]
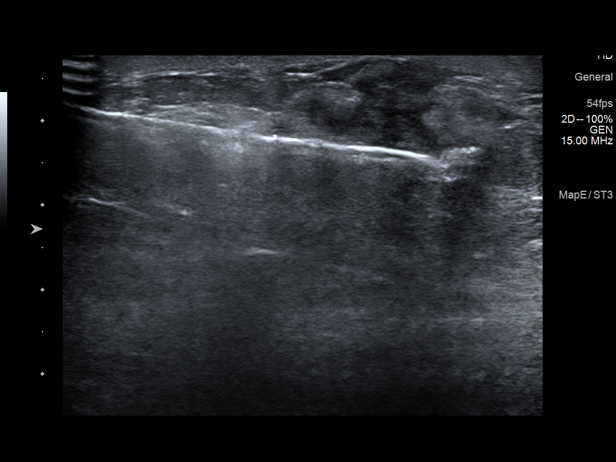
[im 10/18]
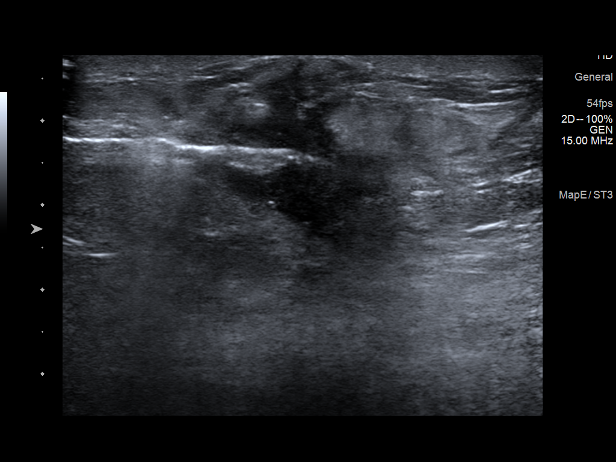
[im 11/18]
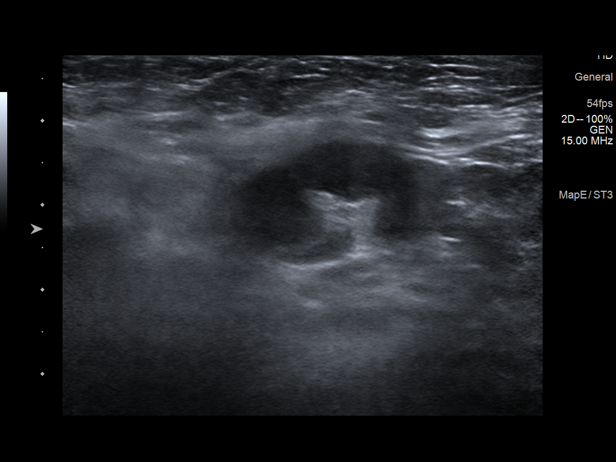
[im 13/18]
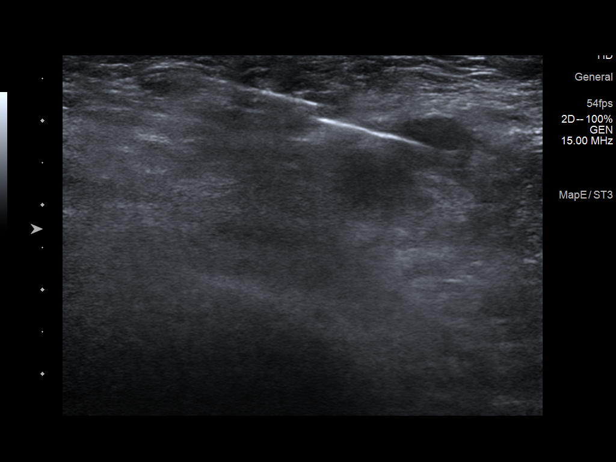
[im 14/18]
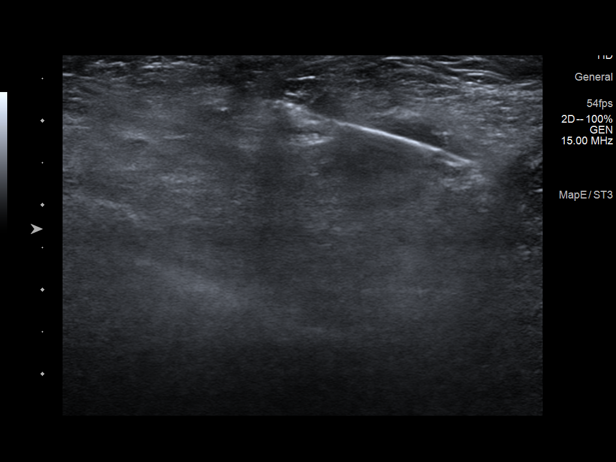
[im 15/18]
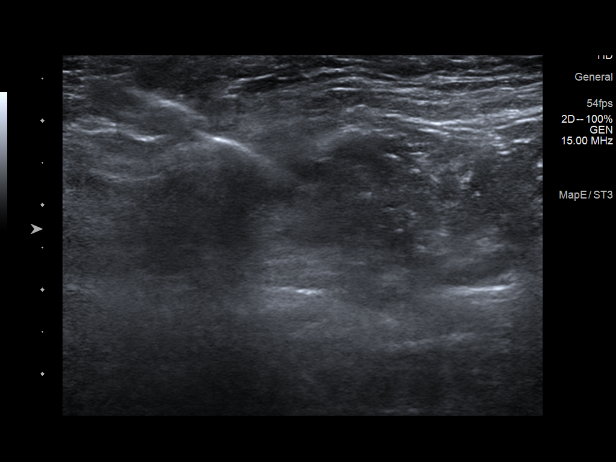
[im 17/18]
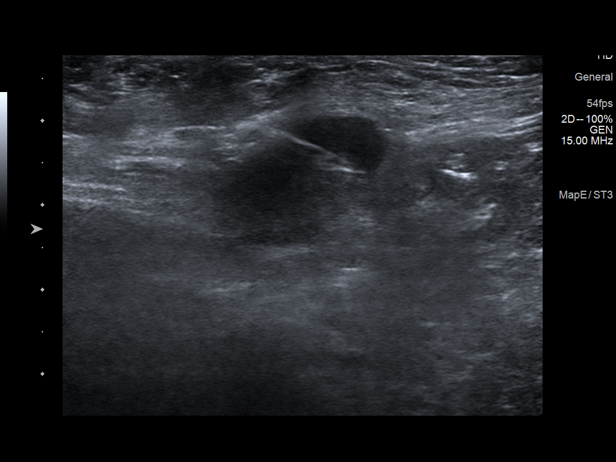
[im 18/18]
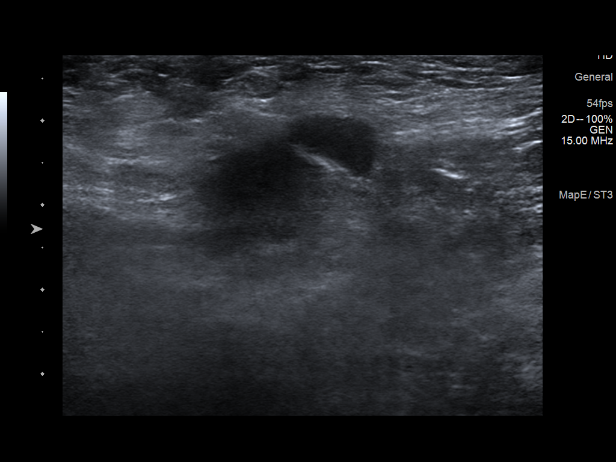

[14 of 18 positions shown; findings below may reference images not displayed]

PROCEDURE:
I met with the patient and we discussed the procedure of
ultrasound-guided biopsies, including benefits and alternatives. We
discussed the high likelihood of successful procedures. We discussed
the risks of the procedures, including infection, bleeding, tissue
injury, clip migration, and inadequate sampling. Informed written
consent was given. The usual time-out protocol was performed
immediately prior to the procedure.

ULTRASOUND GUIDED RIGHT BREAST CORE NEEDLE BIOPSY

Using sterile technique and 1% Lidocaine as local anesthetic, under
direct ultrasound visualization, a 14 gauge KENDRIC device was
used to perform biopsy of the 1.3 cm mass at the 6 o'clock position
using a LATERAL approach. At the conclusion of the procedure a
RIBBON shaped tissue marker clip was deployed into the biopsy
cavity. Follow up 2 view mammogram was performed and dictated
separately. Skin oozing noted so a Quik clot was placed.

US AXILLARY NODE CORE BIOPSY RIGHT

Using sterile technique and 1% Lidocaine as local anesthetic, under
direct ultrasound visualization, a 14 gauge KENDRIC device was
used to perform biopsy of the RIGHT axillary lymph node with mild
cortical thickening using a LATERAL approach. At the conclusion of
the procedure a HydroMARK spiral tissue marker clip was deployed
into the biopsy cavity. Follow up 2 view mammogram was performed and
dictated separately.

ULTRASOUND GUIDED LEFT BREAST CORE NEEDLE BIOPSY

Using sterile technique and 1% Lidocaine as local anesthetic, under
direct ultrasound visualization, a 14 gauge KENDRIC device was
used to perform biopsy of the 2.9 cm mass at the 9 o'clock position
of the LEFT breast using a MEDIAL approach. At the conclusion of the
procedure a RIBBON shaped tissue marker clip was deployed into the
biopsy cavity. Follow up 2 view mammogram was performed and dictated
separately.

US AXILLARY NODE CORE BIOPSY LEFT

Using sterile technique and 1% Lidocaine as local anesthetic, under
direct ultrasound visualization, a 14 gauge KENDRIC device was
used to perform biopsy of 1 of the abnormal LEFT axillary lymph
nodes with cortical thickening using a MEDIAL approach. At the
conclusion of the procedure a HydroMARK spiral tissue marker clip
was deployed into the biopsy cavity. Follow up 2 view mammogram was
performed and dictated separately.
IMPRESSION: Ultrasound guided biopsy of the 1.3 cm LOWER RIGHT breast mass and
abnormal RIGHT axillary lymph node.

Ultrasound-guided biopsy of the 2.9 cm INNER LEFT breast mass and 1
of the abnormal LEFT axillary lymph nodes.

No apparent complications.

ADDENDUM:
Pathology revealed GRADE III INVASIVE DUCTAL CARCINOMA of the RIGHT
breast, 6:00 o'clock, (ribbon clip). This was found to be concordant
by Dr. KENDRIC.

Pathology revealed NO CARCINOMA IDENTIFIED IN NODAL TISSUE of the
RIGHT axilla, (hydromark spiral clip). This was found to be
concordant by Dr. KENDRIC.

Pathology revealed GRADE III INVASIVE MAMMARY CARCINOMA of the LEFT
breast, 9:00 o'clock, (ribbon clip). This was found to be concordant
by Dr. KENDRIC.

Pathology revealed METASTATIC CARCINOMA INVOLVING NODAL TISSUE of
the LEFT axilla, (hydromark spiral clip). This was found to be
concordant by Dr. KENDRIC.

Pathology results were discussed with the patient's KENDRIC by telephone, per request. The patient's daughter
reported her mother did well after the biopsies with tenderness at
the sites. Post biopsy instructions and care were reviewed and
questions were answered. The patient's daughter was encouraged to
call The [REDACTED] for any additional
concerns. My direct phone number was provided.

The patient was referred to [REDACTED]
[REDACTED] at [REDACTED] on

NOTE: Stereotactic biopsy of indeterminate RIGHT breast
calcifications can be attempted at a later date if this will alter
clinical management.

Pathology results reported by KENDRIC, RN on [DATE].

*** End of Addendum ***
PROCEDURE:
I met with the patient and we discussed the procedure of
ultrasound-guided biopsies, including benefits and alternatives. We
discussed the high likelihood of successful procedures. We discussed
the risks of the procedures, including infection, bleeding, tissue
injury, clip migration, and inadequate sampling. Informed written
consent was given. The usual time-out protocol was performed
immediately prior to the procedure.

ULTRASOUND GUIDED RIGHT BREAST CORE NEEDLE BIOPSY

Using sterile technique and 1% Lidocaine as local anesthetic, under
direct ultrasound visualization, a 14 gauge KENDRIC device was
used to perform biopsy of the 1.3 cm mass at the 6 o'clock position
using a LATERAL approach. At the conclusion of the procedure a
RIBBON shaped tissue marker clip was deployed into the biopsy
cavity. Follow up 2 view mammogram was performed and dictated
separately. Skin oozing noted so a Quik clot was placed.

US AXILLARY NODE CORE BIOPSY RIGHT

Using sterile technique and 1% Lidocaine as local anesthetic, under
direct ultrasound visualization, a 14 gauge KENDRIC device was
used to perform biopsy of the RIGHT axillary lymph node with mild
cortical thickening using a LATERAL approach. At the conclusion of
the procedure a HydroMARK spiral tissue marker clip was deployed
into the biopsy cavity. Follow up 2 view mammogram was performed and
dictated separately.

ULTRASOUND GUIDED LEFT BREAST CORE NEEDLE BIOPSY

Using sterile technique and 1% Lidocaine as local anesthetic, under
direct ultrasound visualization, a 14 gauge KENDRIC device was
used to perform biopsy of the 2.9 cm mass at the 9 o'clock position
of the LEFT breast using a MEDIAL approach. At the conclusion of the
procedure a RIBBON shaped tissue marker clip was deployed into the
biopsy cavity. Follow up 2 view mammogram was performed and dictated
separately.

US AXILLARY NODE CORE BIOPSY LEFT

Using sterile technique and 1% Lidocaine as local anesthetic, under
direct ultrasound visualization, a 14 gauge KENDRIC device was
used to perform biopsy of 1 of the abnormal LEFT axillary lymph
nodes with cortical thickening using a MEDIAL approach. At the
conclusion of the procedure a HydroMARK spiral tissue marker clip
was deployed into the biopsy cavity. Follow up 2 view mammogram was
performed and dictated separately.
IMPRESSION: Ultrasound guided biopsy of the 1.3 cm LOWER RIGHT breast mass and
abnormal RIGHT axillary lymph node.

Ultrasound-guided biopsy of the 2.9 cm INNER LEFT breast mass and 1
of the abnormal LEFT axillary lymph nodes.

No apparent complications.

## 2022-04-04 IMAGING — MG MM BREAST LOCALIZATION CLIP
4 series · 4 of 12 positions shown · non-contrast
Comparison: Previous exam(s).

CLINICAL DATA: Evaluate placement of RIBBON biopsy clips following
bilateral breast biopsies.

EXAM:
3D DIAGNOSTIC BILATERAL MAMMOGRAM POST ULTRASOUND BIOPSY

[L ML synth-2D]
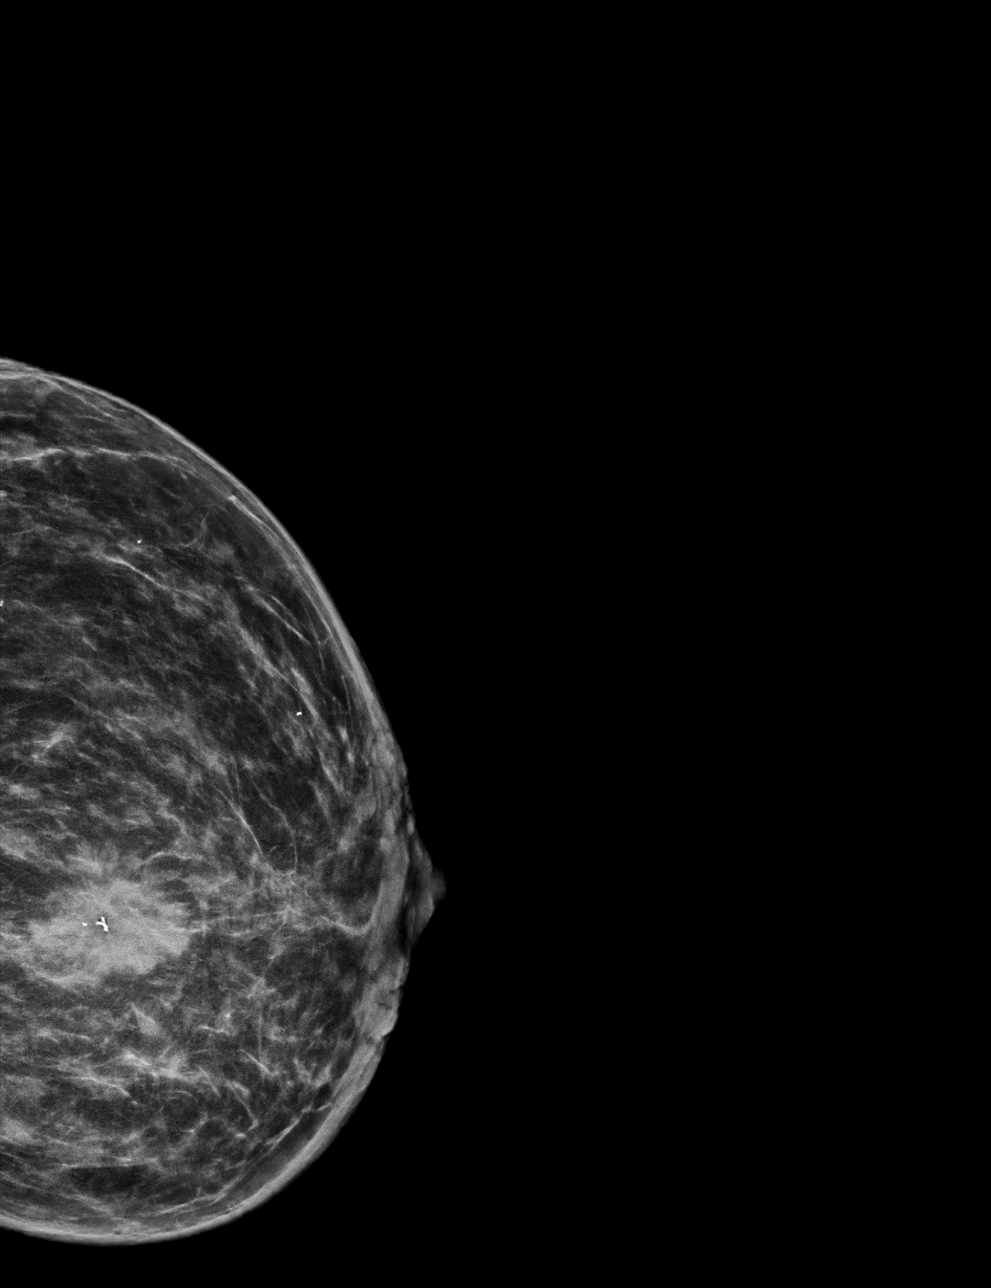

[L CC synth-2D]
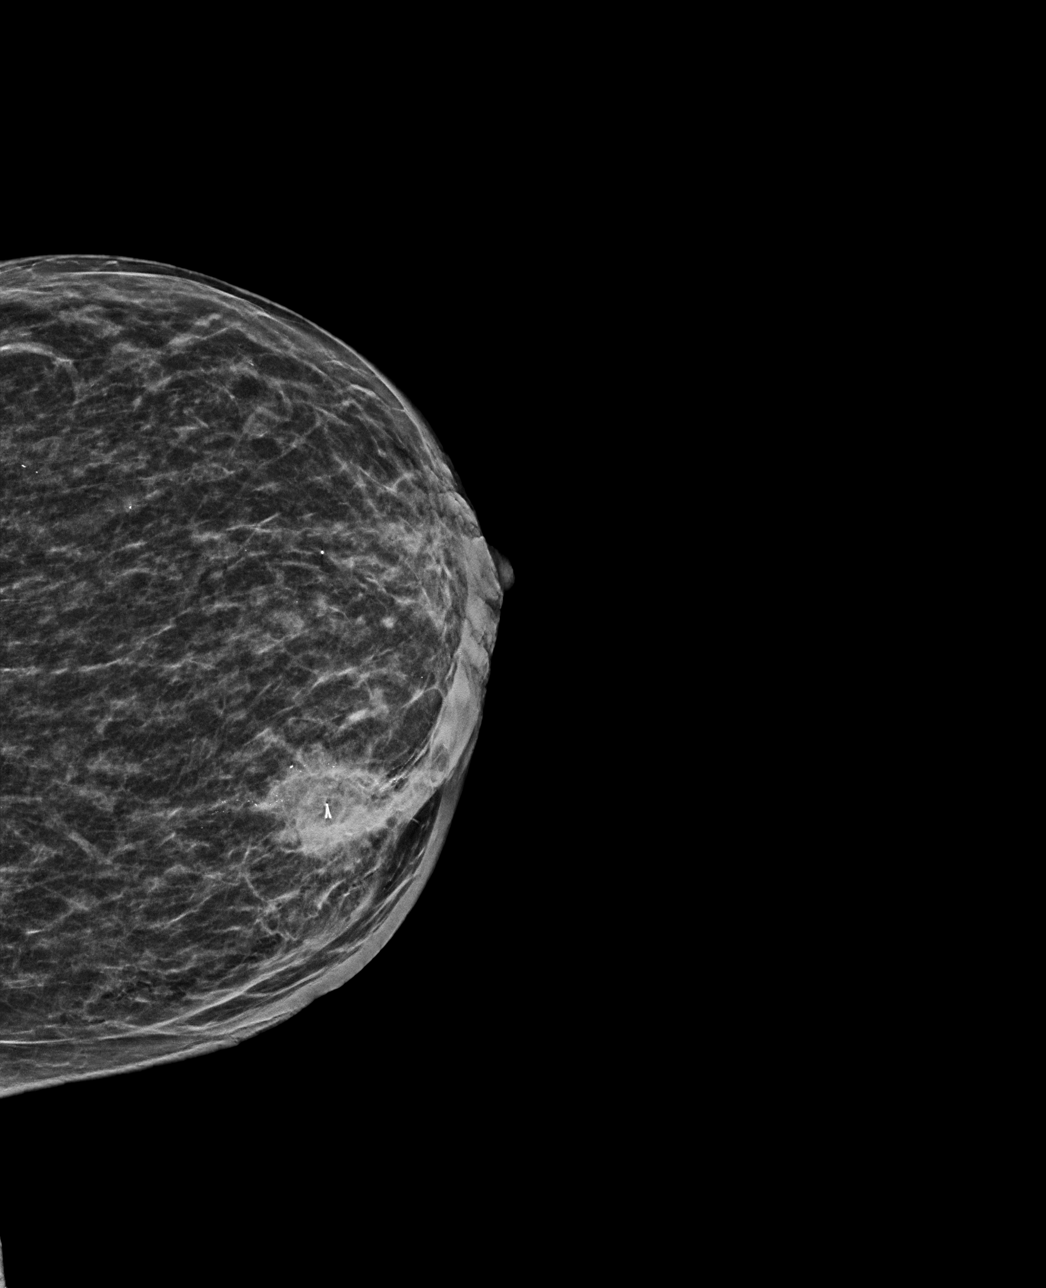

[L CC tomo · tomo slice 25/49.0]
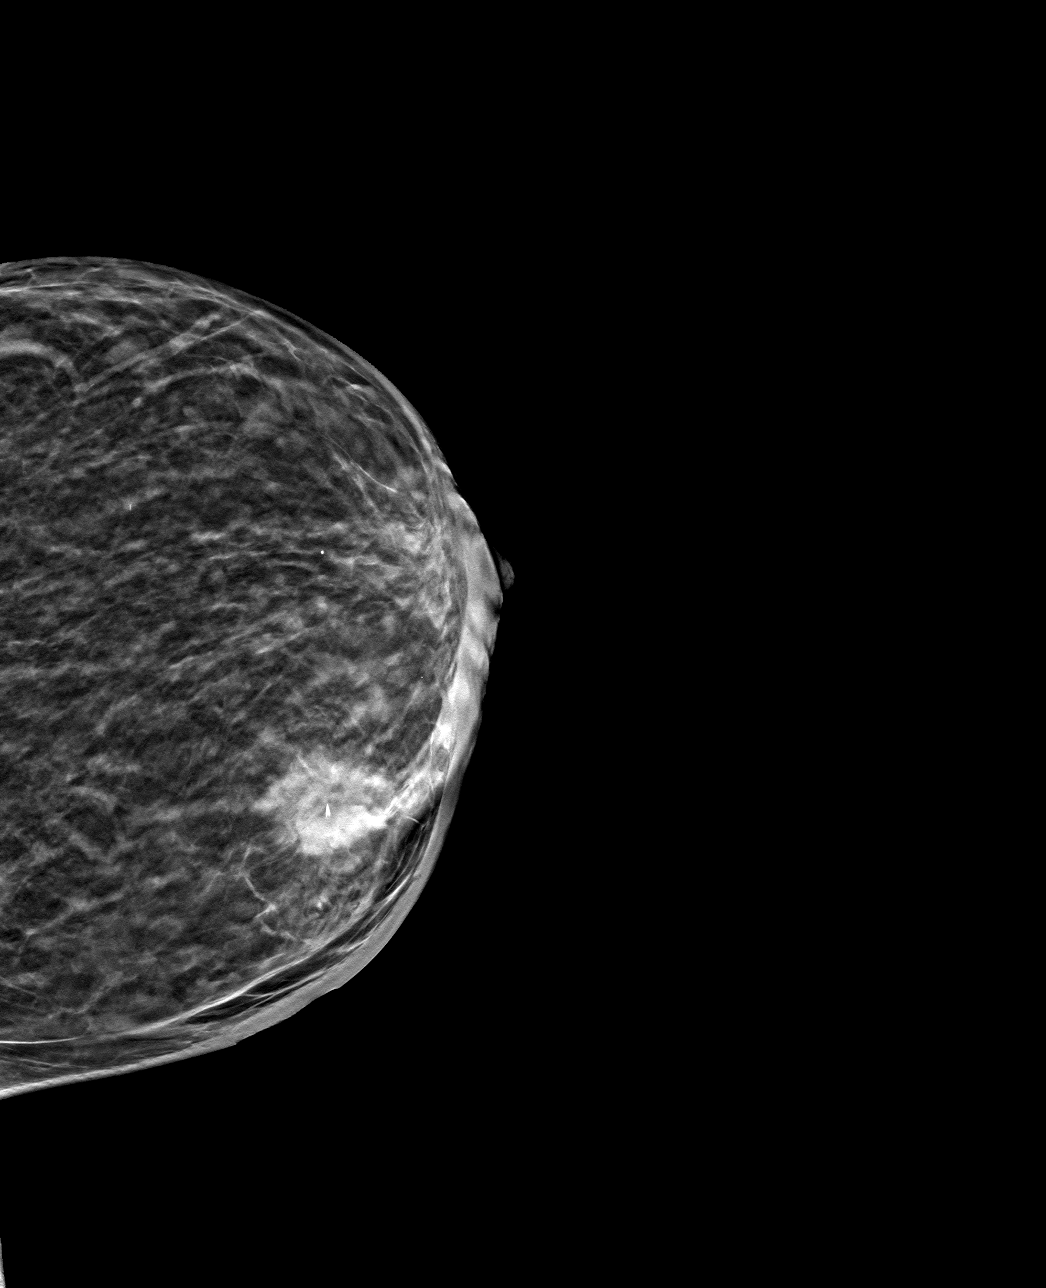

[L ML tomo · tomo slice 29/58.0]
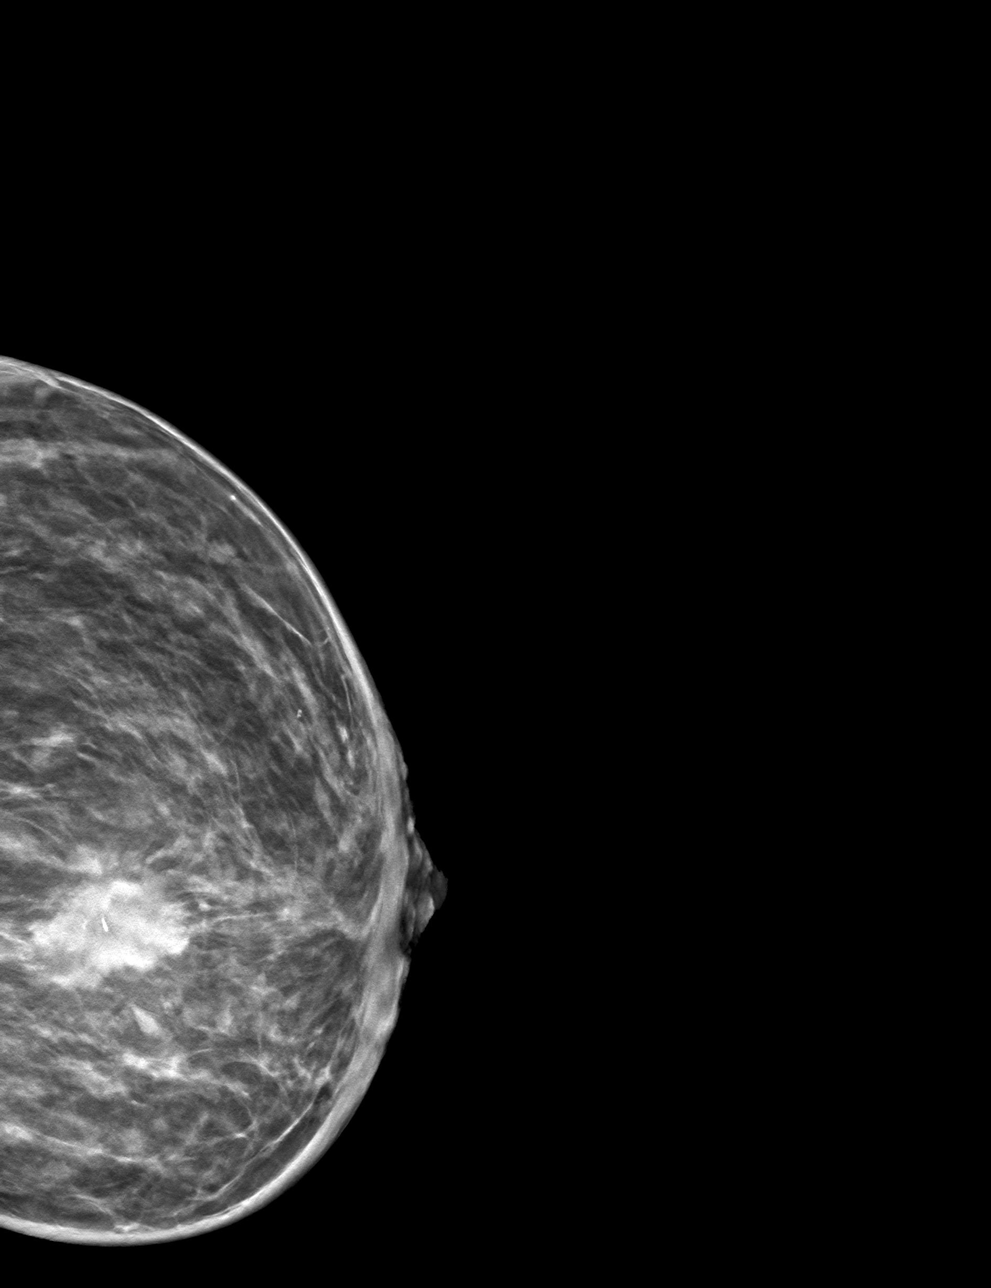

[4 of 12 positions shown; findings below may reference images not displayed]

FINDINGS: 3D Mammographic images were obtained following ultrasound guided
biopsy of a mass at the 6 o'clock position of the RIGHT breast and a
mass at the 9 o'clock position of the LEFT breast.

The RIBBON biopsy marking clip is in expected position at the site
of biopsy within the LOWER RIGHT breast.

The RIBBON biopsy marking clip is in expected position at the site
of biopsy within the INNER LEFT breast.

The HydroMARK spiral clips placed within the biopsied axillary lymph
nodes are not able to be visualized on this study. These HydroMARK
clips are satisfactory located within the biopsied axillary lymph
nodes, confirmed sonographically.
IMPRESSION: Appropriate positioning of the RIBBON shaped biopsy marking clip at
the site of biopsy in the LOWER RIGHT breast.

Appropriate positioning of the RIBBON shaped biopsy marking clip at
the site of biopsy in the INNER RIGHT breast.

HydroMARK spiral clips placed within the biopsied bilateral axillary
lymph nodes are not visualized on this study, but satisfactory
placement was confirmed sonographically.

Final Assessment: Post Procedure Mammograms for Marker Placement

## 2022-04-04 IMAGING — US US  BREAST BX W/ LOC DEV 1ST LESION IMG BX SPEC US GUIDE*R*
1 series · 14 of 23 positions shown · non-contrast
Comparison: Prior studies
COMPARISON: Prior studies

Addendum:
CLINICAL DATA: 75-year-old female for tissue sampling of 1.3 cm
LOWER RIGHT breast mass, abnormal RIGHT axillary lymph node, 2.9 cm
INNER LEFT breast mass and abnormal LEFT axillary lymph node.

EXAM:
ULTRASOUND GUIDED RIGHT BREAST CORE NEEDLE BIOPSY
US AXILLARY NODE CORE BIOPSY RIGHT
ULTRASOUND GUIDED LEFT BREAST CORE NEEDLE BIOPSY
US AXILLARY NODE CORE BIOPSY LEFT

[Series 1: us breast bx w/ loc dev 1st lesion img bx spec us  · 0.07mm/px · 14 of 23 slices shown]
[im 1/23]
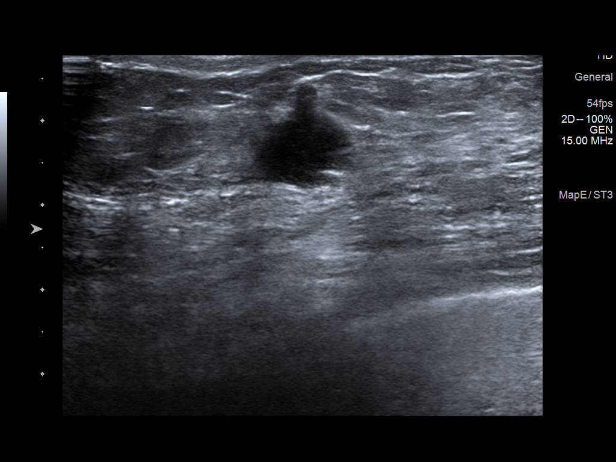
[im 3/23]
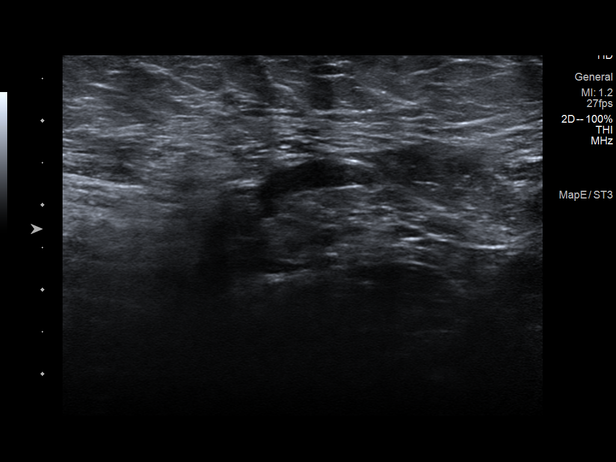
[im 5/23]
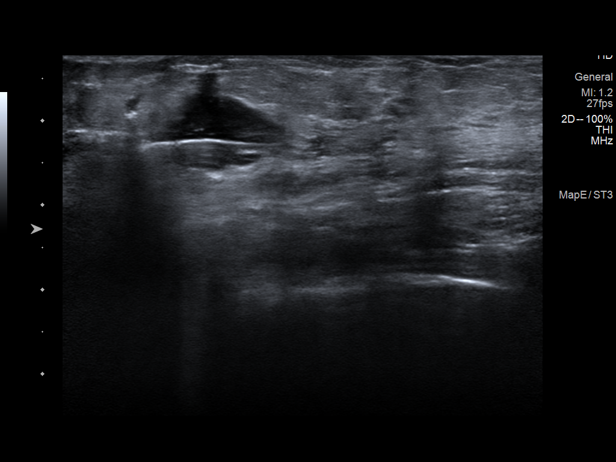
[im 6/23]
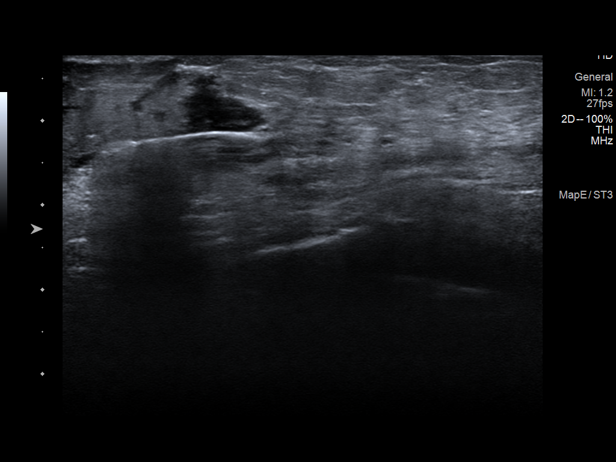
[im 8/23]
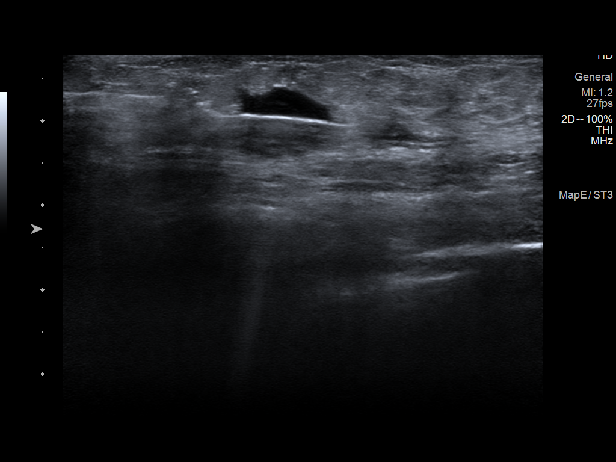
[im 10/23]
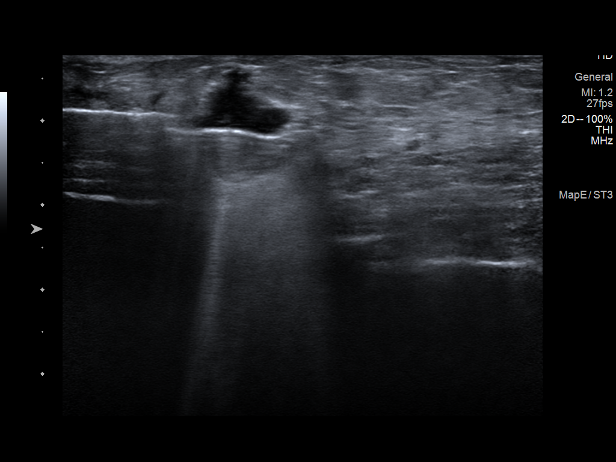
[im 11/23]
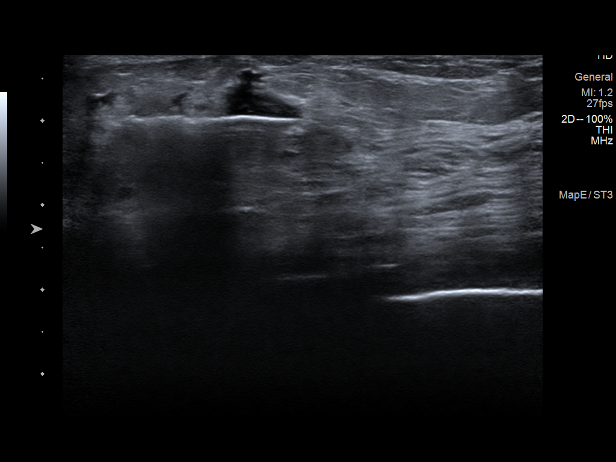
[im 13/23]
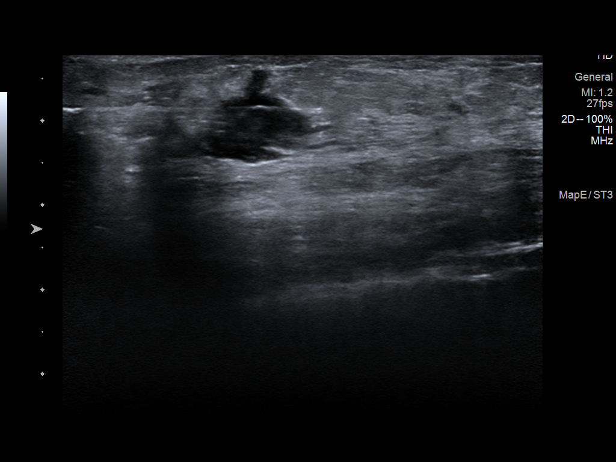
[im 14/23]
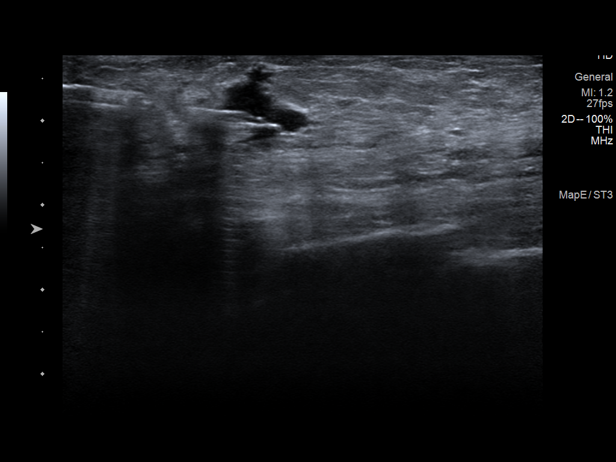
[im 16/23]
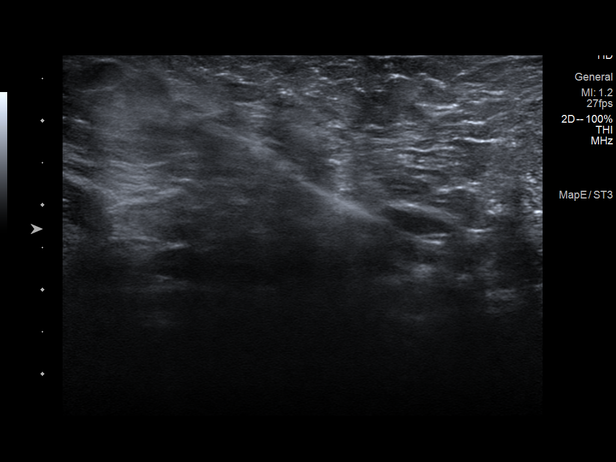
[im 18/23]
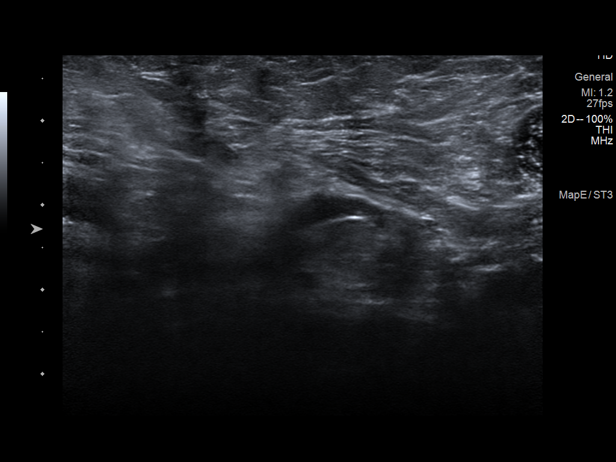
[im 19/23]
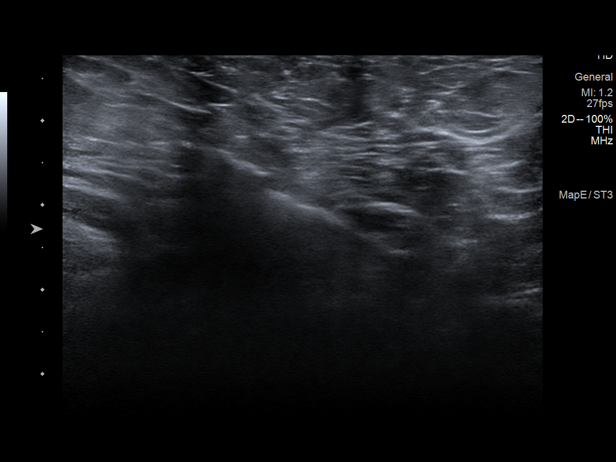
[im 21/23]
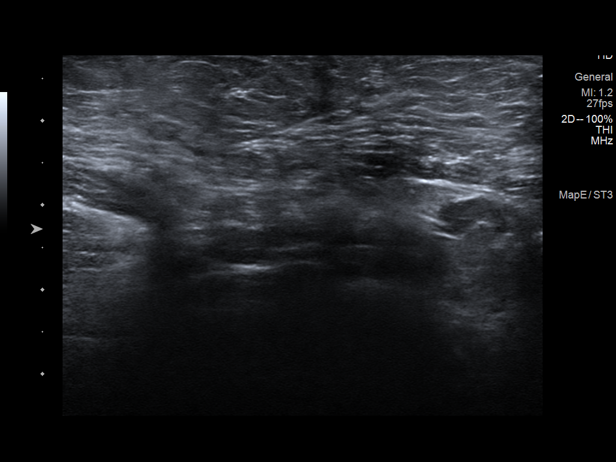
[im 23/23]
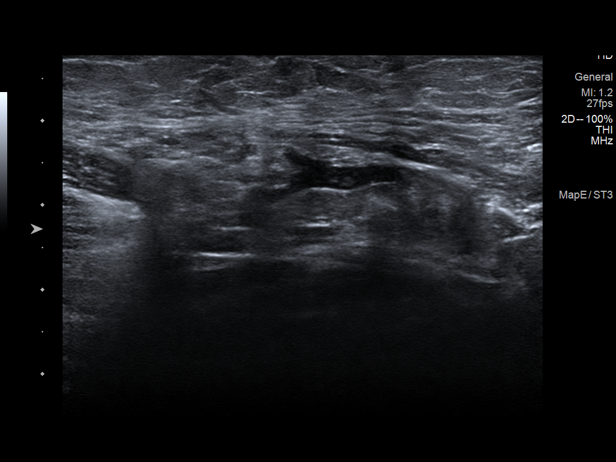

[14 of 23 positions shown; findings below may reference images not displayed]

PROCEDURE:
I met with the patient and we discussed the procedure of
ultrasound-guided biopsies, including benefits and alternatives. We
discussed the high likelihood of successful procedures. We discussed
the risks of the procedures, including infection, bleeding, tissue
injury, clip migration, and inadequate sampling. Informed written
consent was given. The usual time-out protocol was performed
immediately prior to the procedure.

ULTRASOUND GUIDED RIGHT BREAST CORE NEEDLE BIOPSY

Using sterile technique and 1% Lidocaine as local anesthetic, under
direct ultrasound visualization, a 14 gauge KENDRIC device was
used to perform biopsy of the 1.3 cm mass at the 6 o'clock position
using a LATERAL approach. At the conclusion of the procedure a
RIBBON shaped tissue marker clip was deployed into the biopsy
cavity. Follow up 2 view mammogram was performed and dictated
separately. Skin oozing noted so a Quik clot was placed.

US AXILLARY NODE CORE BIOPSY RIGHT

Using sterile technique and 1% Lidocaine as local anesthetic, under
direct ultrasound visualization, a 14 gauge KENDRIC device was
used to perform biopsy of the RIGHT axillary lymph node with mild
cortical thickening using a LATERAL approach. At the conclusion of
the procedure a HydroMARK spiral tissue marker clip was deployed
into the biopsy cavity. Follow up 2 view mammogram was performed and
dictated separately.

ULTRASOUND GUIDED LEFT BREAST CORE NEEDLE BIOPSY

Using sterile technique and 1% Lidocaine as local anesthetic, under
direct ultrasound visualization, a 14 gauge KENDRIC device was
used to perform biopsy of the 2.9 cm mass at the 9 o'clock position
of the LEFT breast using a MEDIAL approach. At the conclusion of the
procedure a RIBBON shaped tissue marker clip was deployed into the
biopsy cavity. Follow up 2 view mammogram was performed and dictated
separately.

US AXILLARY NODE CORE BIOPSY LEFT

Using sterile technique and 1% Lidocaine as local anesthetic, under
direct ultrasound visualization, a 14 gauge KENDRIC device was
used to perform biopsy of 1 of the abnormal LEFT axillary lymph
nodes with cortical thickening using a MEDIAL approach. At the
conclusion of the procedure a HydroMARK spiral tissue marker clip
was deployed into the biopsy cavity. Follow up 2 view mammogram was
performed and dictated separately.
IMPRESSION: Ultrasound guided biopsy of the 1.3 cm LOWER RIGHT breast mass and
abnormal RIGHT axillary lymph node.

Ultrasound-guided biopsy of the 2.9 cm INNER LEFT breast mass and 1
of the abnormal LEFT axillary lymph nodes.

No apparent complications.

ADDENDUM:
Pathology revealed GRADE III INVASIVE DUCTAL CARCINOMA of the RIGHT
breast, 6:00 o'clock, (ribbon clip). This was found to be concordant
by Dr. KENDRIC.

Pathology revealed NO CARCINOMA IDENTIFIED IN NODAL TISSUE of the
RIGHT axilla, (hydromark spiral clip). This was found to be
concordant by Dr. KENDRIC.

Pathology revealed GRADE III INVASIVE MAMMARY CARCINOMA of the LEFT
breast, 9:00 o'clock, (ribbon clip). This was found to be concordant
by Dr. KENDRIC.

Pathology revealed METASTATIC CARCINOMA INVOLVING NODAL TISSUE of
the LEFT axilla, (hydromark spiral clip). This was found to be
concordant by Dr. KENDRIC.

Pathology results were discussed with the patient's KENDRIC by telephone, per request. The patient's daughter
reported her mother did well after the biopsies with tenderness at
the sites. Post biopsy instructions and care were reviewed and
questions were answered. The patient's daughter was encouraged to
call The [REDACTED] for any additional
concerns. My direct phone number was provided.

The patient was referred to [REDACTED]
[REDACTED] at [REDACTED] on

NOTE: Stereotactic biopsy of indeterminate RIGHT breast
calcifications can be attempted at a later date if this will alter
clinical management.

Pathology results reported by KENDRIC, RN on [DATE].

*** End of Addendum ***
PROCEDURE:
I met with the patient and we discussed the procedure of
ultrasound-guided biopsies, including benefits and alternatives. We
discussed the high likelihood of successful procedures. We discussed
the risks of the procedures, including infection, bleeding, tissue
injury, clip migration, and inadequate sampling. Informed written
consent was given. The usual time-out protocol was performed
immediately prior to the procedure.

ULTRASOUND GUIDED RIGHT BREAST CORE NEEDLE BIOPSY

Using sterile technique and 1% Lidocaine as local anesthetic, under
direct ultrasound visualization, a 14 gauge KENDRIC device was
used to perform biopsy of the 1.3 cm mass at the 6 o'clock position
using a LATERAL approach. At the conclusion of the procedure a
RIBBON shaped tissue marker clip was deployed into the biopsy
cavity. Follow up 2 view mammogram was performed and dictated
separately. Skin oozing noted so a Quik clot was placed.

US AXILLARY NODE CORE BIOPSY RIGHT

Using sterile technique and 1% Lidocaine as local anesthetic, under
direct ultrasound visualization, a 14 gauge KENDRIC device was
used to perform biopsy of the RIGHT axillary lymph node with mild
cortical thickening using a LATERAL approach. At the conclusion of
the procedure a HydroMARK spiral tissue marker clip was deployed
into the biopsy cavity. Follow up 2 view mammogram was performed and
dictated separately.

ULTRASOUND GUIDED LEFT BREAST CORE NEEDLE BIOPSY

Using sterile technique and 1% Lidocaine as local anesthetic, under
direct ultrasound visualization, a 14 gauge KENDRIC device was
used to perform biopsy of the 2.9 cm mass at the 9 o'clock position
of the LEFT breast using a MEDIAL approach. At the conclusion of the
procedure a RIBBON shaped tissue marker clip was deployed into the
biopsy cavity. Follow up 2 view mammogram was performed and dictated
separately.

US AXILLARY NODE CORE BIOPSY LEFT

Using sterile technique and 1% Lidocaine as local anesthetic, under
direct ultrasound visualization, a 14 gauge KENDRIC device was
used to perform biopsy of 1 of the abnormal LEFT axillary lymph
nodes with cortical thickening using a MEDIAL approach. At the
conclusion of the procedure a HydroMARK spiral tissue marker clip
was deployed into the biopsy cavity. Follow up 2 view mammogram was
performed and dictated separately.
IMPRESSION: Ultrasound guided biopsy of the 1.3 cm LOWER RIGHT breast mass and
abnormal RIGHT axillary lymph node.

Ultrasound-guided biopsy of the 2.9 cm INNER LEFT breast mass and 1
of the abnormal LEFT axillary lymph nodes.

No apparent complications.

## 2022-04-04 IMAGING — MG MM BREAST LOCALIZATION CLIP
4 series · 4 of 12 positions shown · non-contrast
Comparison: Previous exam(s).

CLINICAL DATA: Evaluate placement of RIBBON biopsy clips following
bilateral breast biopsies.

EXAM:
3D DIAGNOSTIC BILATERAL MAMMOGRAM POST ULTRASOUND BIOPSY

[R ML synth-2D]
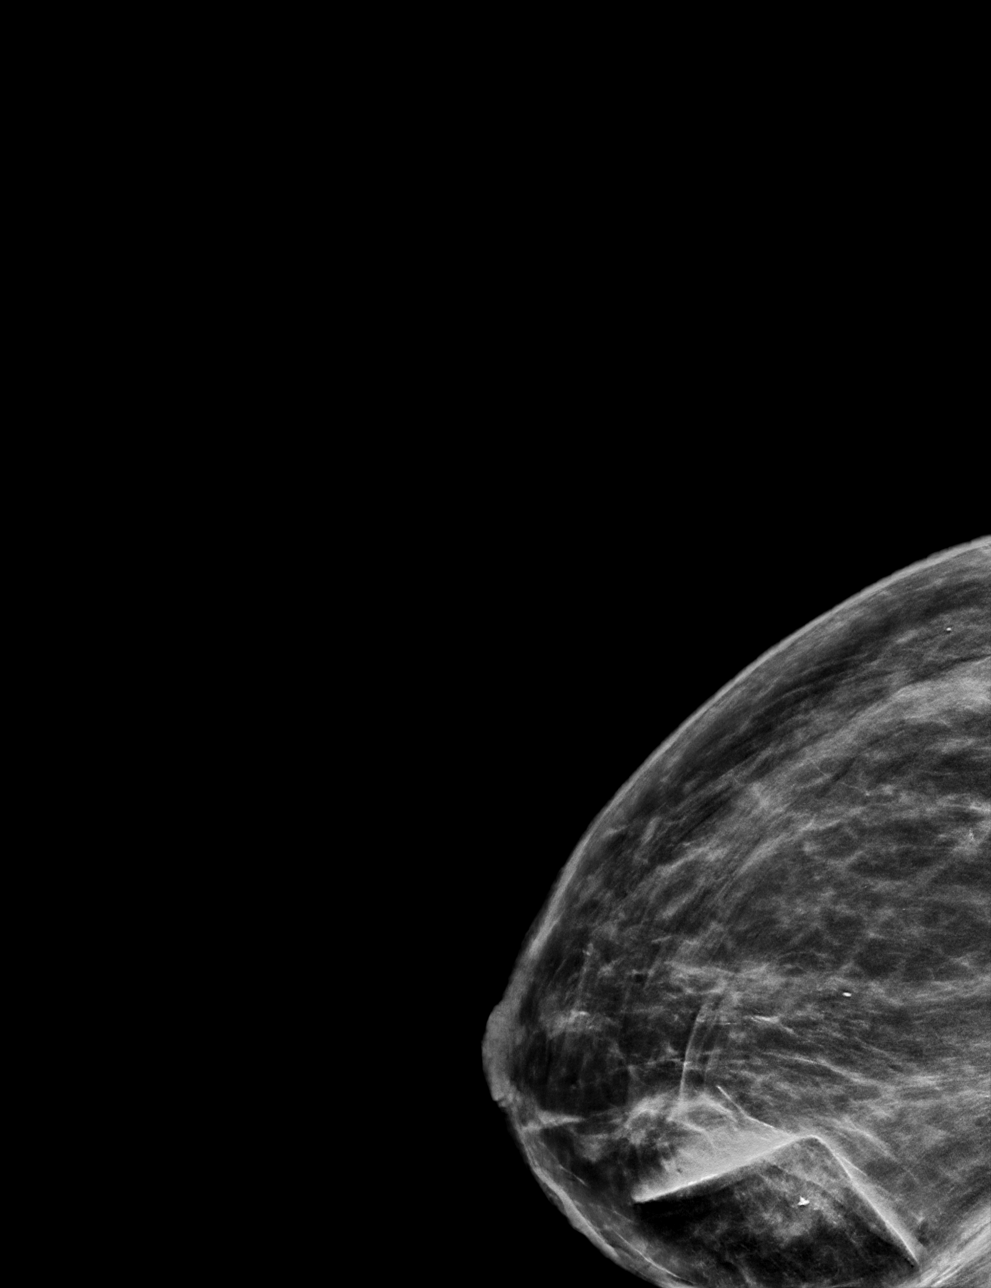

[R CC synth-2D]
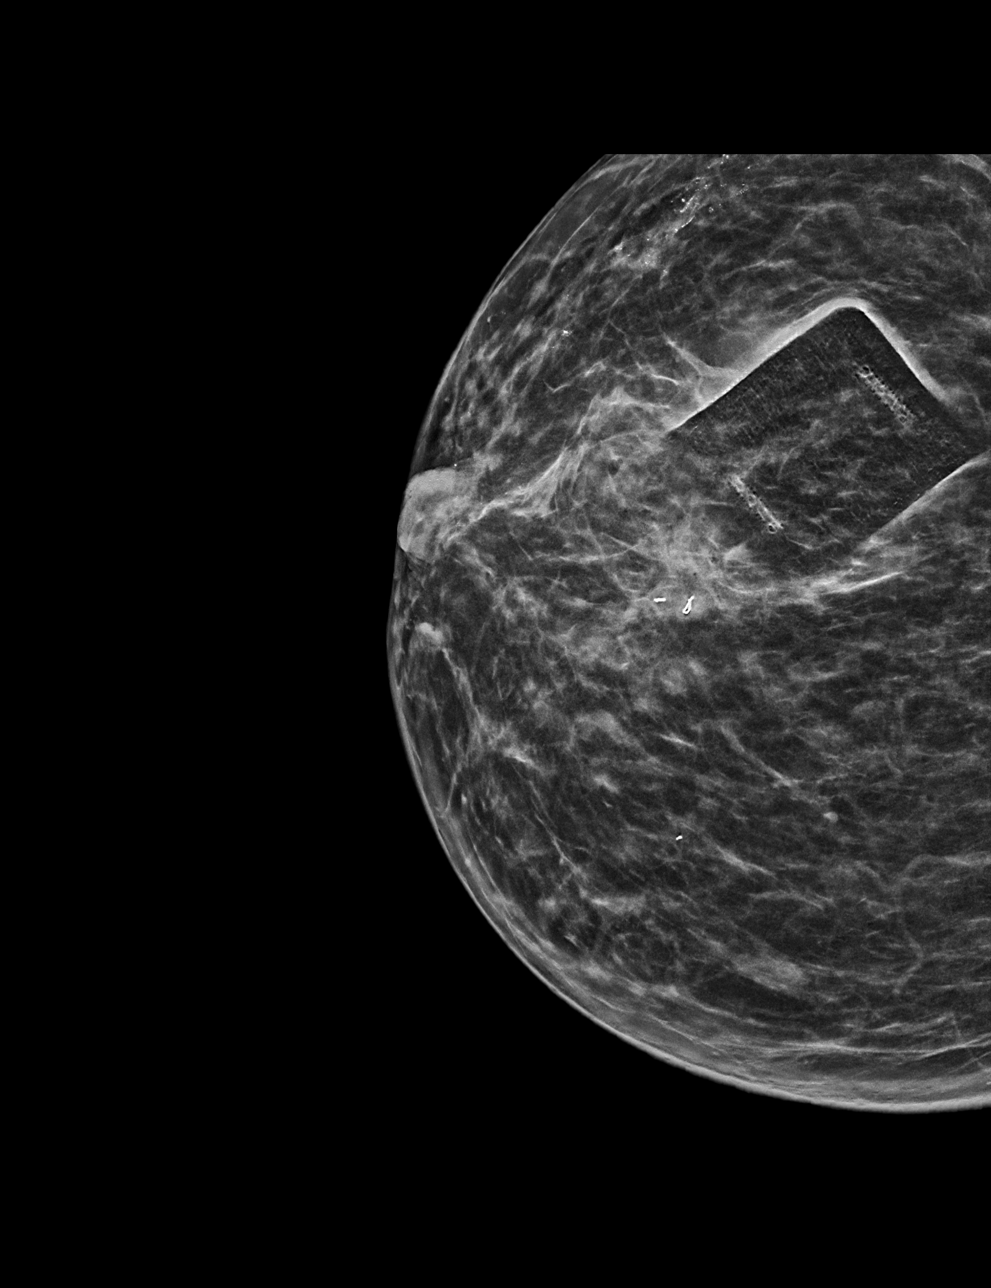

[R ML tomo · tomo slice 35/69.0]
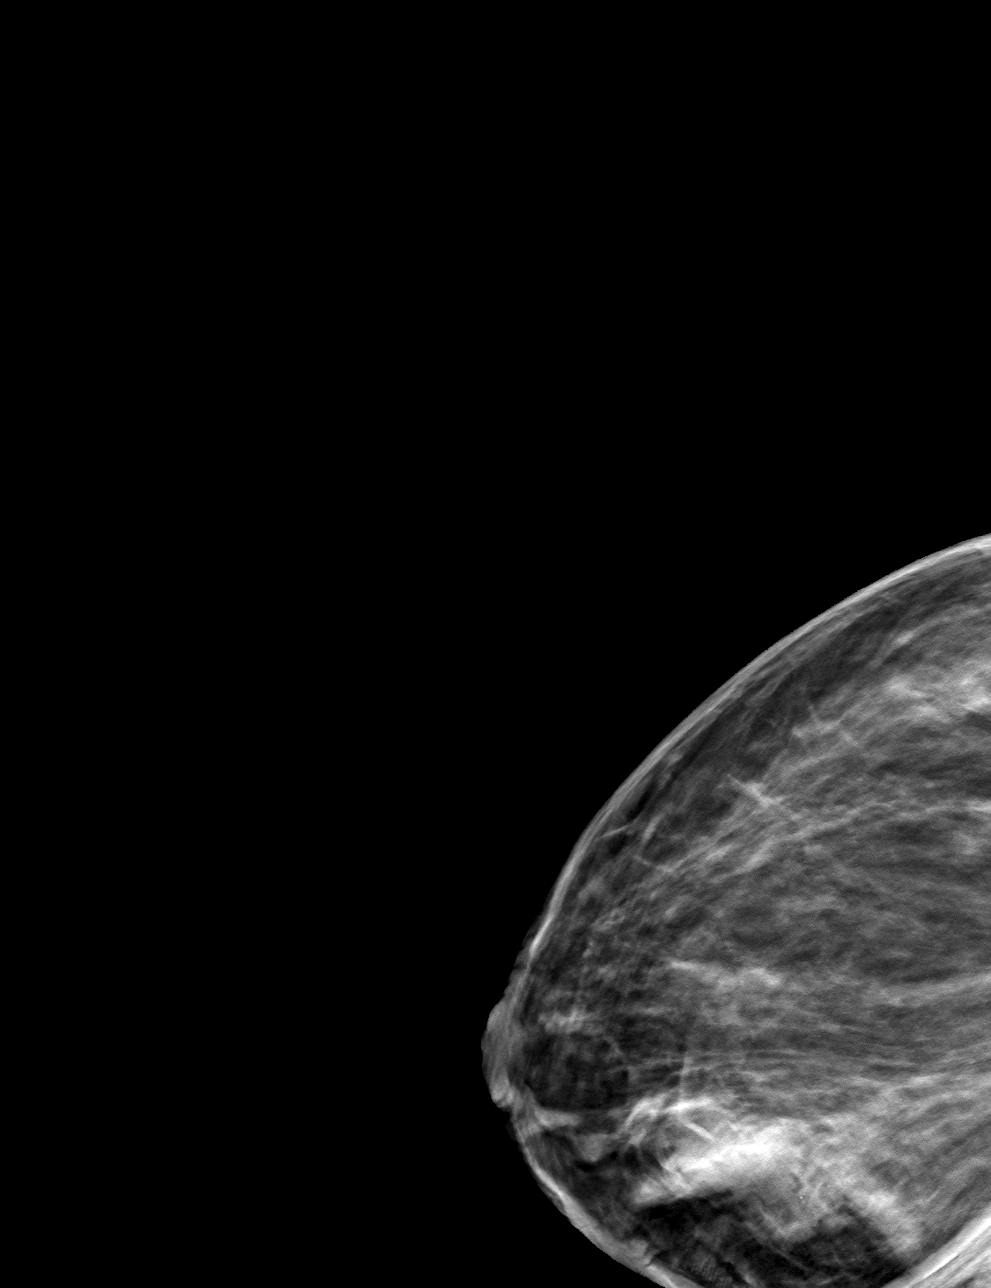

[R CC tomo · tomo slice 25/49.0]
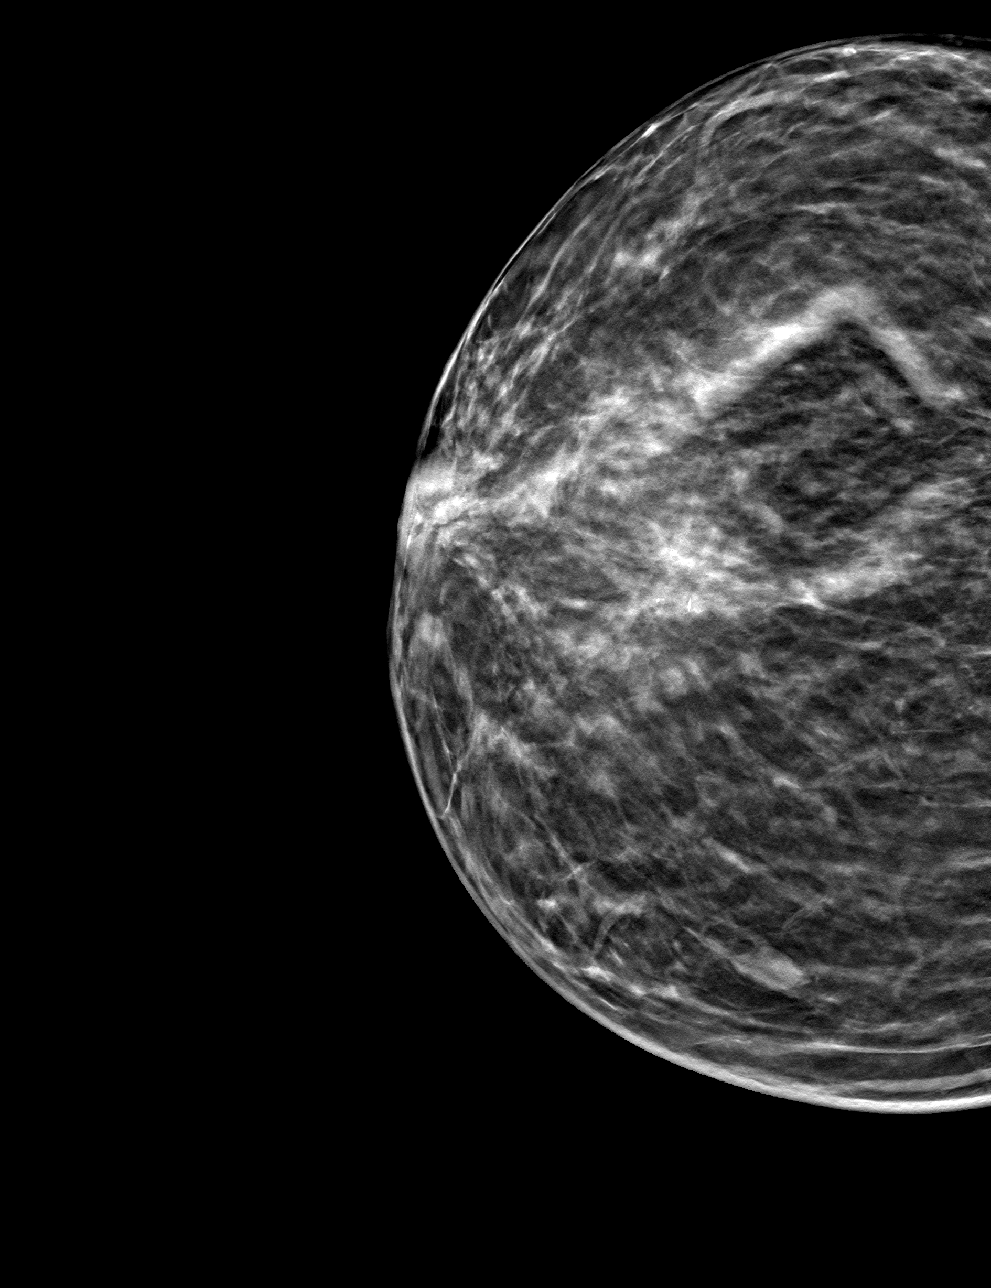

[4 of 12 positions shown; findings below may reference images not displayed]

FINDINGS: 3D Mammographic images were obtained following ultrasound guided
biopsy of a mass at the 6 o'clock position of the RIGHT breast and a
mass at the 9 o'clock position of the LEFT breast.

The RIBBON biopsy marking clip is in expected position at the site
of biopsy within the LOWER RIGHT breast.

The RIBBON biopsy marking clip is in expected position at the site
of biopsy within the INNER LEFT breast.

The HydroMARK spiral clips placed within the biopsied axillary lymph
nodes are not able to be visualized on this study. These HydroMARK
clips are satisfactory located within the biopsied axillary lymph
nodes, confirmed sonographically.
IMPRESSION: Appropriate positioning of the RIBBON shaped biopsy marking clip at
the site of biopsy in the LOWER RIGHT breast.

Appropriate positioning of the RIBBON shaped biopsy marking clip at
the site of biopsy in the INNER RIGHT breast.

HydroMARK spiral clips placed within the biopsied bilateral axillary
lymph nodes are not visualized on this study, but satisfactory
placement was confirmed sonographically.

Final Assessment: Post Procedure Mammograms for Marker Placement

## 2022-04-06 ENCOUNTER — Encounter: Payer: Self-pay | Admitting: *Deleted

## 2022-04-06 ENCOUNTER — Telehealth: Payer: Self-pay | Admitting: *Deleted

## 2022-04-06 NOTE — Telephone Encounter (Signed)
Called to discuss [facility] on 5/31. No answer. Mailbox full. Will call again.

## 2022-04-07 ENCOUNTER — Encounter: Payer: Self-pay | Admitting: *Deleted

## 2022-04-07 ENCOUNTER — Telehealth: Payer: Self-pay | Admitting: *Deleted

## 2022-04-07 NOTE — Telephone Encounter (Signed)
Confirmed [facility] for 04/12/22 at 1215 .  Instructions and contact information given.

## 2022-04-11 ENCOUNTER — Other Ambulatory Visit: Payer: Self-pay | Admitting: *Deleted

## 2022-04-11 ENCOUNTER — Encounter: Payer: Self-pay | Admitting: *Deleted

## 2022-04-11 DIAGNOSIS — C50511 Malignant neoplasm of lower-outer quadrant of right female breast: Secondary | ICD-10-CM | POA: Insufficient documentation

## 2022-04-11 DIAGNOSIS — C50212 Malignant neoplasm of upper-inner quadrant of left female breast: Secondary | ICD-10-CM | POA: Insufficient documentation

## 2022-04-11 NOTE — Progress Notes (Signed)
[place] NOTE  Patient Care Team: [name], [name], MD as PCP - General (Family Medicine) Pieter Partridge, DO as Consulting Physician (Neurology) [name], RN as Case Manager Saporito, Maree Erie, LCSW as Social Worker (Licensed Clinical Social Worker) [name], [name], MD (Family Medicine) Mauro Kaufmann, RN as Oncology Nurse Navigator Rockwell Germany, RN as Oncology Nurse Navigator Erroll Luna, MD as Consulting Physician (General Surgery) Nicholas Lose, MD as Consulting Physician (Hematology and Oncology) Eppie Gibson, MD as Attending Physician (Radiation Oncology)  CHIEF COMPLAINTS/PURPOSE OF CONSULTATION:  Newly diagnosed breast cancer  HISTORY OF PRESENTING ILLNESS: BJSEGBTD Zamirah Denny 76 y.o. female is here because of recent diagnosis of left breast cancer. Screening mammogram on 03/31/22 detected palpable left breast lump Diagnostic mammogram showed Highly suspicious left breast mass consistent with the patient's palpable lump at the 9 o'clock position. Recommendation is for ultrasound-guided biopsy. 2. Overlying skin and trabecular thickening of the left breast, suggesting lymphovascular spread of disease. Skin punch biopsy should be performed if this will alter clinical management. 3. Suspicious left axillary lymphadenopathy with diffuse cortical thickening up to 1.1 cm. Recommendation ultrasound-guided biopsy. 4. Suspicious right breast mass at the 6 o'clock position. Recommendation is for ultrasound-guided biopsy. 5. 2 cm group of indeterminate calcifications in the upper-outer quadrant of the right breast. Unfortunately, this patient is felt to be a poor candidate for stereotactic biopsy given her markedly limited mobility. 6. Borderline right axillary lymphadenopathy up to 3.5 mm. Biopsy on the date of 04/04/22 showed: Prognostic indicators significant for ER 100%, POSITIVE; PR Progesterone Receptor: 30%, POSITIVE, Her2 status 2+;  Grade ***. Proliferation Marker Ki67: 40%   I reviewed her records extensively and collaborated the history with the patient.  SUMMARY OF ONCOLOGIC HISTORY: Oncology History   No history exists.     MEDICAL HISTORY:  Past Medical History:  Diagnosis Date   Blood transfusion without reported diagnosis    Diabetes mellitus without complication ([place])    prediabetic-no medications   Gait abnormality    Hyperlipidemia    Hypertension    Neuromuscular disorder ([place])    never damage for the way she walks   Stroke [facility])    mimi    SURGICAL HISTORY: Past Surgical History:  Procedure Laterality Date   ABDOMINAL HYSTERECTOMY     maxilo facial surgery      SOCIAL HISTORY: Social History   Socioeconomic History   Marital status: Widowed    Spouse name: Not on file   Number of children: 2   Years of education: 10   Highest education level: 10th grade  Occupational History   Occupation: Retired  Tobacco Use   Smoking status: Never   Smokeless tobacco: Never  Vaping Use   Vaping Use: Never used  Substance and Sexual Activity   Alcohol use: No   Drug use: No   Sexual activity: Not Currently  Other Topics Concern   Not on file  Social History Narrative   ** Merged History Encounter **       Right handed Lives daughter in a two story home but lives on first floor Drinks no caffeine    Social Determinants of Health   Financial Resource Strain: Low Risk    Difficulty of Paying Living Expenses: Not hard at all  Food Insecurity: No Food Insecurity   Worried About Charity fundraiser in the Last Year: Never true   [place] in the Last Year: Never true  Transportation Needs: No Data processing manager (Medical): No   Lack of Transportation (Non-Medical): No  Physical Activity: Inactive   Days of Exercise per Week: 0 days   Minutes of Exercise per Session: 0 min  Stress: No Stress Concern Present   Feeling of Stress : Not at all  Social  Connections: Socially Isolated   Frequency of Communication with Friends and Family: More than three times a week   Frequency of Social Gatherings with Friends and Family: More than three times a week   Attends Religious Services: Never   Marine scientist or Organizations: No   Attends Archivist Meetings: Never   Marital Status: Widowed  Human resources officer Violence: Not At Risk   Fear of Current or Ex-Partner: No   Emotionally Abused: No   Physically Abused: No   Sexually Abused: No    FAMILY HISTORY: Family History  Problem Relation Age of Onset   Kidney disease Mother    Diabetes Mother    Alcohol abuse Father    Diabetes Sister    Diabetes Sister    Diabetes Daughter    Colon cancer Neg Hx    Colon polyps Neg Hx    Heart disease Neg Hx    Rectal cancer Neg Hx    Stomach cancer Neg Hx     ALLERGIES:  has No Known Allergies.  MEDICATIONS:  Current Outpatient Medications  Medication Sig Dispense Refill   acetaminophen (TYLENOL) 500 MG tablet Take 500 mg by mouth every 6 (six) hours as needed for mild pain.     amLODipine (NORVASC) 10 MG tablet Take 1 tablet (10 mg total) by mouth daily. 30 tablet 0   atorvastatin (LIPITOR) 40 MG tablet Take 1 tablet (40 mg total) by mouth daily. 90 tablet 3   cholecalciferol (VITAMIN D3) 25 MCG (1000 UNIT) tablet Take 1,000 Units by mouth daily.     co-enzyme Q-10 30 MG capsule Take 30 mg by mouth daily.     omega-3 acid ethyl esters (LOVAZA) 1 g capsule Take 1 g by mouth 2 (two) times daily.     No current facility-administered medications for this visit.    REVIEW OF SYSTEMS:   Constitutional: Denies fevers, chills or abnormal night sweats Eyes: Denies blurriness of vision, double vision or watery eyes Ears, nose, mouth, throat, and face: Denies mucositis or sore throat Respiratory: Denies cough, dyspnea or wheezes Cardiovascular: Denies palpitation, chest discomfort or lower extremity swelling Gastrointestinal:   Denies nausea, heartburn or change in bowel habits Skin: Denies abnormal skin rashes Lymphatics: Denies new lymphadenopathy or easy bruising Neurological:Denies numbness, tingling or new weaknesses Behavioral/Psych: Mood is stable, no new changes  Breast: *** Denies any palpable lumps or discharge All other systems were reviewed with the patient and are negative.  PHYSICAL EXAMINATION: ECOG PERFORMANCE STATUS: {CHL ONC ECOG [phone]}  There were no vitals filed for this visit. There were no vitals filed for this visit.  GENERAL:alert, no distress and comfortable SKIN: skin color, texture, turgor are normal, no rashes or significant lesions EYES: normal, conjunctiva are pink and non-injected, sclera clear OROPHARYNX:no exudate, no erythema and lips, buccal mucosa, and tongue normal  NECK: supple, thyroid normal size, non-tender, without nodularity LYMPH:  no palpable lymphadenopathy in the cervical, axillary or inguinal LUNGS: clear to auscultation and percussion with normal breathing effort HEART: regular rate & rhythm and no murmurs and no lower extremity edema ABDOMEN:abdomen soft, non-tender and normal bowel sounds Musculoskeletal:no cyanosis  of digits and no clubbing  PSYCH: alert & oriented x 3 with fluent speech NEURO: no focal motor/sensory deficits BREAST:*** No palpable nodules in breast. No palpable axillary or supraclavicular lymphadenopathy (exam performed in the presence of a chaperone)   LABORATORY DATA:  I have reviewed the data as listed Lab Results  Component Value Date   WBC 7.1 08/27/2021   HGB 13.2 08/27/2021   HCT 40.9 08/27/2021   MCV 93.8 08/27/2021   PLT 251 08/27/2021   Lab Results  Component Value Date   NA 142 03/17/2022   K 3.8 03/17/2022   CL 105 03/17/2022   CO2 25 03/17/2022    RADIOGRAPHIC STUDIES: I have personally reviewed the radiological reports and agreed with the findings in the report.  ASSESSMENT AND PLAN:  No  problem-specific Assessment & Plan notes found for this encounter.   All questions were answered. The patient knows to call the clinic with any problems, questions or concerns.    [place], CMA 04/11/22  I Gardiner Coins am scribing for Dr. Lindi Adie  ***

## 2022-04-11 NOTE — Progress Notes (Signed)
Radiation Oncology         [phone]) [phone] ________________________________  Initial Outpatient Consultation  Name: Sheila Perez MRN: [id number]  Date: 04/12/2022  DOB: Feb 09, 1946  AY:[id number], [name], MD  Erroll Luna, MD   REFERRING PHYSICIAN: Erroll Luna, MD  DIAGNOSIS: No diagnosis found.  Right Breast LOQ Invasive Ductal Carcinoma, ER+ / PR+ / Her2-, Grade 3  Left Breast UIQ, Invasive Mammary Carcinoma, ER+ / PR+ / Her2+, Grade 3   Cancer Staging  Malignant neoplasm of lower-outer quadrant of right breast of female, estrogen receptor positive ([place]) Staging form: Breast, AJCC 8th Edition - Clinical: Stage IA (cT1c, cN0, cM0, G3, ER+, PR+, HER2: Equivocal) - Signed by Nicholas Lose, MD on 04/12/2022  Malignant neoplasm of upper-inner quadrant of left breast in female, estrogen receptor positive ([place]) Staging form: Breast, AJCC 8th Edition - Clinical: Stage IIB (cT2, cN1, cM0, G3, ER+, PR+, HER2: Equivocal) - Signed by Nicholas Lose, MD on 04/12/2022  CHIEF COMPLAINT: Here to discuss management of bilateral breast cancers  HISTORY OF PRESENT ILLNESS::Sheila Perez is a 76 y.o. female who presented with a palpable left breast lump.   Subsequent diagnostic bilateral mammogram and ultrasound on 03/31/22 revealed: a highly suspicious left breast mass measuring 2.9 x 2.4 x 2.3 cm consistent with the patient's palpable lump at the 9 o'clock position; multiple left axillary lymph nodes with diffuse cortical thickening measuring up to 1.1 cm; overlying skin and trabecular thickening of the left breast suggestive of lymphovascular spread of disease; a suspicious right breast mass at the 6 o'clock position 3 cmfn with associated vascularity measuring 1.3 x 1.0 x 1.1 cm; a single right axillary lymph node with borderline cortical thickening measuring up to 3.5 mm; and a 2 cm group of indeterminate calcifications in the upper-outer quadrant of the right breast.   Biopsy  of the left breast mass on date of 04/04/22 showed grade 3 invasive mammary carcinoma measuring 1.2 cm in the greatest linear extent, with metastatic carcinoma involving a left axillary lymph node.  ER status: 70% positive with  moderate staining intensity; PR status 5% positive with strong staining intensity; Proliferation marker Ki67 at 30%; Her2 status positive; Grade 3.  Biopsy of the right breast mass performed on that same date revealed grade 3 invasive ductal carcinoma measuring 1.1 cm in the greatest linear extent. Right axillary lymph node biopsy showed no evidence of carcinoma. ER status: 100% positive; PR status 30% positive (both with strong staining intensity); Proliferation marker Ki67 at 70%; Her2 status negative; Grade 3.  Of note: the patient has markedly limited mobility and requires a wheelchair.   ***  PREVIOUS RADIATION THERAPY: No  PAST MEDICAL HISTORY:  has a past medical history of Blood transfusion without reported diagnosis, Diabetes mellitus without complication ([place]), Gait abnormality, Hyperlipidemia, Hypertension, Neuromuscular disorder ([place]), and Stroke ([place]).    PAST SURGICAL HISTORY: Past Surgical History:  Procedure Laterality Date   ABDOMINAL HYSTERECTOMY     maxilo facial surgery      FAMILY HISTORY: family history includes Alcohol abuse in her father; Diabetes in her daughter, mother, sister, and sister; Kidney disease in her mother.  SOCIAL HISTORY:  reports that she has never smoked. She has never used smokeless tobacco. She reports that she does not drink alcohol and does not use drugs.  ALLERGIES: Patient has no known allergies.  MEDICATIONS:  Current Outpatient Medications  Medication Sig Dispense Refill   acetaminophen (TYLENOL) 500 MG tablet Take 500 mg by mouth every  6 (six) hours as needed for mild pain.     amLODipine (NORVASC) 10 MG tablet Take 1 tablet (10 mg total) by mouth daily. 30 tablet 0   atorvastatin (LIPITOR) 40 MG tablet Take 1  tablet (40 mg total) by mouth daily. 90 tablet 3   cholecalciferol (VITAMIN D3) 25 MCG (1000 UNIT) tablet Take 1,000 Units by mouth daily.     co-enzyme Q-10 30 MG capsule Take 30 mg by mouth daily.     omega-3 acid ethyl esters (LOVAZA) 1 g capsule Take 1 g by mouth 2 (two) times daily.     No current facility-administered medications for this encounter.    REVIEW OF SYSTEMS: As above in HPI.   PHYSICAL EXAM:  vitals were not taken for this visit.   General: Alert and oriented, in no acute distress HEENT: Head is normocephalic. Extraocular movements are intact. Oropharynx is clear. Neck: Neck is supple, no palpable cervical or supraclavicular lymphadenopathy. Heart: Regular in rate and rhythm with no murmurs, rubs, or gallops. Chest: Clear to auscultation bilaterally, with no rhonchi, wheezes, or rales. Abdomen: Soft, nontender, nondistended, with no rigidity or guarding. Extremities: No cyanosis or edema. Lymphatics: see Neck Exam Skin: No concerning lesions. Musculoskeletal: symmetric strength and muscle tone throughout. Neurologic: Cranial nerves II through XII are grossly intact. No obvious focalities. Speech is fluent. Coordination is intact. Psychiatric: Judgment and insight are intact. Affect is appropriate. Breasts: *** . No other palpable masses appreciated in the breasts or axillae *** .    ECOG = ***  0 - Asymptomatic (Fully active, able to carry on all predisease activities without restriction)  1 - Symptomatic but completely ambulatory (Restricted in physically strenuous activity but ambulatory and able to carry out work of a light or sedentary nature. For example, light housework, office work)  2 - Symptomatic, <50% in bed during the day (Ambulatory and capable of all self care but unable to carry out any work activities. Up and about more than 50% of waking hours)  3 - Symptomatic, >50% in bed, but not bedbound (Capable of only limited self-care, confined to bed or  chair 50% or more of waking hours)  4 - Bedbound (Completely disabled. Cannot carry on any self-care. Totally confined to bed or chair)  5 - Death   Eustace Pen MM, Creech RH, Tormey DC, et al. [phone]). "Toxicity and response criteria of the [facility]". [place] Oncol. 5 (6): 649-55   LABORATORY DATA:  Lab Results  Component Value Date   WBC 7.1 08/27/2021   HGB 13.2 08/27/2021   HCT 40.9 08/27/2021   MCV 93.8 08/27/2021   PLT 251 08/27/2021   CMP     Component Value Date/Time   NA 142 03/17/2022 1630   K 3.8 03/17/2022 1630   CL 105 03/17/2022 1630   CO2 25 03/17/2022 1630   GLUCOSE 81 03/17/2022 1630   BUN 19 03/17/2022 1630   CREATININE 1.12 (H) 03/17/2022 1630   CALCIUM 9.6 03/17/2022 1630   PROT 6.6 08/27/2021 1126   ALBUMIN 3.2 (L) 08/27/2021 1126   AST 15 08/27/2021 1126   ALT 12 08/27/2021 1126   ALKPHOS 54 08/27/2021 1126   BILITOT 0.7 08/27/2021 1126   GFRNONAA >60 08/27/2021 1126   GFRNONAA 57 (L) 09/08/2020 1642   GFRAA 66 09/08/2020 1642         RADIOGRAPHY: DG Shoulder Left  Result Date: 03/20/2022 CLINICAL DATA:  Left shoulder pain, limitation of motion. Muscle atrophy.  EXAM: LEFT SHOULDER - 2+ VIEW COMPARISON:  None FINDINGS: Osseous demineralization. AC joint alignment normal. Minimal acromial spurring. No acute fracture, dislocation, or bone destruction. Visualized ribs intact. IMPRESSION: No acute osseous abnormalities. Osseous demineralization with minimal acromial spurring. Electronically Signed   By: Lavonia Dana M.D.   On: 03/20/2022 08:40   US BREAST LTD UNI LEFT INC AXILLA  Result Date: 03/31/2022 CLINICAL DATA:  76 year old female with a palpable left breast lump. The patient is in a wheelchair with markedly limited mobility. The best possible images were obtained. EXAM: DIGITAL DIAGNOSTIC BILATERAL MAMMOGRAM WITH TOMOSYNTHESIS AND CAD; ULTRASOUND LEFT BREAST LIMITED; ULTRASOUND RIGHT BREAST LIMITED TECHNIQUE: Bilateral  digital diagnostic mammography and breast tomosynthesis was performed. The images were evaluated with computer-aided detection.; Targeted ultrasound examination of the left breast was performed.; Targeted ultrasound examination of the right breast was performed COMPARISON:  None. ACR Breast Density Category c: The breast tissue is heterogeneously dense, which may obscure small masses. FINDINGS: A radiopaque BB was placed at the site of the patient's palpable lump in the lower inner quadrant of the left breast. A spiculated hyperdense mass with associated calcifications is seen deep to the radiopaque BB. There is overlying skin and trabecular thickening of the left breast. No additional suspicious findings on the left. Within the right breast, there is a spiculated hyperdense mass in the lower inner quadrant at anterior depth. Additionally, there is a 2 cm group of pleomorphic calcifications in the upper-outer quadrant of the right breast. No additional suspicious findings on the right. Targeted ultrasound is performed, showing an irregular, hypoechoic mass with marked associated vascularity at the 9 o'clock position 3 cm from the nipple on the left. It measures 2.9 x 2.4 x 2.3 cm. This correlates with the mammographic finding. Evaluation of the left axilla demonstrates multiple lymph nodes with diffuse cortical thickening up to 1.1 cm. Evaluation of the right breast demonstrates an irregular hypoechoic mass at the 6 o'clock position 3 cm from the nipple with associated vascularity. It measures 1.3 x 1.0 by 1.1 cm. Evaluation of the right axilla demonstrates a single lymph node with borderline cortical thickening up to 3.5 mm IMPRESSION: 1. Highly suspicious left breast mass consistent with the patient's palpable lump at the 9 o'clock position. Recommendation is for ultrasound-guided biopsy. 2. Overlying skin and trabecular thickening of the left breast, suggesting lymphovascular spread of disease. Skin punch biopsy  should be performed if this will alter clinical management. 3. Suspicious left axillary lymphadenopathy with diffuse cortical thickening up to 1.1 cm. Recommendation ultrasound-guided biopsy. 4. Suspicious right breast mass at the 6 o'clock position. Recommendation is for ultrasound-guided biopsy. 5. 2 cm group of indeterminate calcifications in the upper-outer quadrant of the right breast. Unfortunately, this patient is felt to be a poor candidate for stereotactic biopsy given her markedly limited mobility. 6. Borderline right axillary lymphadenopathy up to 3.5 mm. RECOMMENDATION: 1. Three area ultrasound-guided biopsy of the bilateral breasts and left axilla. This will be scheduled to follow. 2. Consider skin punch biopsy of diffuse left-sided skin thickening if this will alter clinical management. 3. Stereotactic biopsy of indeterminate right breast calcifications and borderline right axillary lymphadenopathy can be attempted at a later date if this will alter clinical management. I have discussed the findings and recommendations with the patient. If applicable, a reminder letter will be sent to the patient regarding the next appointment. BI-RADS CATEGORY  5: Highly suggestive of malignancy. Electronically Signed   By: Kristopher Oppenheim M.D.   On:  03/31/2022 16:19  US BREAST LTD UNI RIGHT INC AXILLA  Result Date: 03/31/2022 CLINICAL DATA:  76 year old female with a palpable left breast lump. The patient is in a wheelchair with markedly limited mobility. The best possible images were obtained. EXAM: DIGITAL DIAGNOSTIC BILATERAL MAMMOGRAM WITH TOMOSYNTHESIS AND CAD; ULTRASOUND LEFT BREAST LIMITED; ULTRASOUND RIGHT BREAST LIMITED TECHNIQUE: Bilateral digital diagnostic mammography and breast tomosynthesis was performed. The images were evaluated with computer-aided detection.; Targeted ultrasound examination of the left breast was performed.; Targeted ultrasound examination of the right breast was performed  COMPARISON:  None. ACR Breast Density Category c: The breast tissue is heterogeneously dense, which may obscure small masses. FINDINGS: A radiopaque BB was placed at the site of the patient's palpable lump in the lower inner quadrant of the left breast. A spiculated hyperdense mass with associated calcifications is seen deep to the radiopaque BB. There is overlying skin and trabecular thickening of the left breast. No additional suspicious findings on the left. Within the right breast, there is a spiculated hyperdense mass in the lower inner quadrant at anterior depth. Additionally, there is a 2 cm group of pleomorphic calcifications in the upper-outer quadrant of the right breast. No additional suspicious findings on the right. Targeted ultrasound is performed, showing an irregular, hypoechoic mass with marked associated vascularity at the 9 o'clock position 3 cm from the nipple on the left. It measures 2.9 x 2.4 x 2.3 cm. This correlates with the mammographic finding. Evaluation of the left axilla demonstrates multiple lymph nodes with diffuse cortical thickening up to 1.1 cm. Evaluation of the right breast demonstrates an irregular hypoechoic mass at the 6 o'clock position 3 cm from the nipple with associated vascularity. It measures 1.3 x 1.0 by 1.1 cm. Evaluation of the right axilla demonstrates a single lymph node with borderline cortical thickening up to 3.5 mm IMPRESSION: 1. Highly suspicious left breast mass consistent with the patient's palpable lump at the 9 o'clock position. Recommendation is for ultrasound-guided biopsy. 2. Overlying skin and trabecular thickening of the left breast, suggesting lymphovascular spread of disease. Skin punch biopsy should be performed if this will alter clinical management. 3. Suspicious left axillary lymphadenopathy with diffuse cortical thickening up to 1.1 cm. Recommendation ultrasound-guided biopsy. 4. Suspicious right breast mass at the 6 o'clock position.  Recommendation is for ultrasound-guided biopsy. 5. 2 cm group of indeterminate calcifications in the upper-outer quadrant of the right breast. Unfortunately, this patient is felt to be a poor candidate for stereotactic biopsy given her markedly limited mobility. 6. Borderline right axillary lymphadenopathy up to 3.5 mm. RECOMMENDATION: 1. Three area ultrasound-guided biopsy of the bilateral breasts and left axilla. This will be scheduled to follow. 2. Consider skin punch biopsy of diffuse left-sided skin thickening if this will alter clinical management. 3. Stereotactic biopsy of indeterminate right breast calcifications and borderline right axillary lymphadenopathy can be attempted at a later date if this will alter clinical management. I have discussed the findings and recommendations with the patient. If applicable, a reminder letter will be sent to the patient regarding the next appointment. BI-RADS CATEGORY  5: Highly suggestive of malignancy. Electronically Signed   By: Kristopher Oppenheim M.D.   On: 03/31/2022 16:19  MM DIAG BREAST TOMO BILATERAL  Result Date: 03/31/2022 CLINICAL DATA:  76 year old female with a palpable left breast lump. The patient is in a wheelchair with markedly limited mobility. The best possible images were obtained. EXAM: DIGITAL DIAGNOSTIC BILATERAL MAMMOGRAM WITH TOMOSYNTHESIS AND CAD; ULTRASOUND LEFT BREAST LIMITED; ULTRASOUND  RIGHT BREAST LIMITED TECHNIQUE: Bilateral digital diagnostic mammography and breast tomosynthesis was performed. The images were evaluated with computer-aided detection.; Targeted ultrasound examination of the left breast was performed.; Targeted ultrasound examination of the right breast was performed COMPARISON:  None. ACR Breast Density Category c: The breast tissue is heterogeneously dense, which may obscure small masses. FINDINGS: A radiopaque BB was placed at the site of the patient's palpable lump in the lower inner quadrant of the left breast. A  spiculated hyperdense mass with associated calcifications is seen deep to the radiopaque BB. There is overlying skin and trabecular thickening of the left breast. No additional suspicious findings on the left. Within the right breast, there is a spiculated hyperdense mass in the lower inner quadrant at anterior depth. Additionally, there is a 2 cm group of pleomorphic calcifications in the upper-outer quadrant of the right breast. No additional suspicious findings on the right. Targeted ultrasound is performed, showing an irregular, hypoechoic mass with marked associated vascularity at the 9 o'clock position 3 cm from the nipple on the left. It measures 2.9 x 2.4 x 2.3 cm. This correlates with the mammographic finding. Evaluation of the left axilla demonstrates multiple lymph nodes with diffuse cortical thickening up to 1.1 cm. Evaluation of the right breast demonstrates an irregular hypoechoic mass at the 6 o'clock position 3 cm from the nipple with associated vascularity. It measures 1.3 x 1.0 by 1.1 cm. Evaluation of the right axilla demonstrates a single lymph node with borderline cortical thickening up to 3.5 mm IMPRESSION: 1. Highly suspicious left breast mass consistent with the patient's palpable lump at the 9 o'clock position. Recommendation is for ultrasound-guided biopsy. 2. Overlying skin and trabecular thickening of the left breast, suggesting lymphovascular spread of disease. Skin punch biopsy should be performed if this will alter clinical management. 3. Suspicious left axillary lymphadenopathy with diffuse cortical thickening up to 1.1 cm. Recommendation ultrasound-guided biopsy. 4. Suspicious right breast mass at the 6 o'clock position. Recommendation is for ultrasound-guided biopsy. 5. 2 cm group of indeterminate calcifications in the upper-outer quadrant of the right breast. Unfortunately, this patient is felt to be a poor candidate for stereotactic biopsy given her markedly limited mobility. 6.  Borderline right axillary lymphadenopathy up to 3.5 mm. RECOMMENDATION: 1. Three area ultrasound-guided biopsy of the bilateral breasts and left axilla. This will be scheduled to follow. 2. Consider skin punch biopsy of diffuse left-sided skin thickening if this will alter clinical management. 3. Stereotactic biopsy of indeterminate right breast calcifications and borderline right axillary lymphadenopathy can be attempted at a later date if this will alter clinical management. I have discussed the findings and recommendations with the patient. If applicable, a reminder letter will be sent to the patient regarding the next appointment. BI-RADS CATEGORY  5: Highly suggestive of malignancy. Electronically Signed   By: Kristopher Oppenheim M.D.   On: 03/31/2022 16:19  [place] AXILLARY NODE CORE BIOPSY LEFT  Addendum Date: 04/05/2022   ADDENDUM REPORT: 04/05/2022 14:04 ADDENDUM: Pathology revealed GRADE III INVASIVE DUCTAL CARCINOMA of the RIGHT breast, 6:00 o'clock, (ribbon clip). This was found to be concordant by Dr. Hassan Rowan. Pathology revealed NO CARCINOMA IDENTIFIED IN NODAL TISSUE of the RIGHT axilla, (hydromark spiral clip). This was found to be concordant by Dr. Hassan Rowan. 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 This was found to be concordant by Dr. Hassan Rowan. Pathology revealed METASTATIC CARCINOMA INVOLVING NODAL TISSUE of the LEFT axilla, (hydromark spiral  clip). This was found to be concordant by Dr. Hassan Rowan. Pathology results were discussed with the patient's daughter, Sheila Perez by telephone, per request. The patient's daughter reported her mother did well after the biopsies with tenderness at the sites. Post biopsy instructions and care were reviewed and questions were answered. The patient's daughter was encouraged to call [facility] for any additional concerns. My direct phone number was provided. The patient was  referred to [facility] on Apr 12, 2022. NOTE: Stereotactic biopsy of indeterminate RIGHT breast calcifications can be attempted at a later date if this will alter clinical management. Pathology results reported by Terie Purser, RN on 04/05/2022. Electronically Signed   By: Margarette Canada M.D.   On: 04/05/2022 14:04   Result Date: 04/05/2022 CLINICAL DATA:  76 year old female for tissue sampling of 1.3 cm LOWER RIGHT breast mass, abnormal RIGHT axillary lymph node, 2.9 cm INNER LEFT breast mass and abnormal LEFT axillary lymph node. EXAM: ULTRASOUND GUIDED RIGHT BREAST CORE NEEDLE BIOPSY [place] AXILLARY NODE CORE BIOPSY RIGHT ULTRASOUND GUIDED LEFT BREAST CORE NEEDLE BIOPSY [place] AXILLARY NODE CORE BIOPSY LEFT COMPARISON:  Prior studies PROCEDURE: I met with the patient and we discussed the procedure of ultrasound-guided biopsies, including benefits and alternatives. We discussed the high likelihood of successful procedures. We discussed the risks of the procedures, including infection, bleeding, tissue injury, clip migration, and inadequate sampling. Informed written consent was given. The usual time-out protocol was performed immediately prior to the procedure. ULTRASOUND GUIDED RIGHT BREAST CORE NEEDLE BIOPSY Using sterile technique and 1% Lidocaine as local anesthetic, under direct ultrasound visualization, a 14 gauge spring-loaded device was used to perform biopsy of the 1.3 cm mass at the 6 o'clock position using a LATERAL approach. At the conclusion of the procedure a RIBBON shaped tissue marker clip was deployed into the biopsy cavity. Follow up 2 view mammogram was performed and dictated separately. Skin oozing noted so a Quik clot was placed. [place] AXILLARY NODE CORE BIOPSY RIGHT Using sterile technique and 1% Lidocaine as local anesthetic, under direct ultrasound visualization, a 14 gauge spring-loaded device was used to perform biopsy of the  RIGHT axillary lymph node with mild cortical thickening using a LATERAL approach. At the conclusion of the procedure a HydroMARK spiral tissue marker clip was deployed into the biopsy cavity. Follow up 2 view mammogram was performed and dictated separately. ULTRASOUND GUIDED LEFT BREAST CORE NEEDLE BIOPSY Using sterile technique and 1% Lidocaine as local anesthetic, under direct ultrasound visualization, a 14 gauge spring-loaded device was used to perform biopsy of the 2.9 cm mass at the 9 o'clock position of the LEFT breast using a MEDIAL approach. At the conclusion of the procedure a RIBBON shaped tissue marker clip was deployed into the biopsy cavity. Follow up 2 view mammogram was performed and dictated separately. [place] AXILLARY NODE CORE BIOPSY LEFT Using sterile technique and 1% Lidocaine as local anesthetic, under direct ultrasound visualization, a 14 gauge spring-loaded device was used to perform biopsy of 1 of the abnormal LEFT axillary lymph nodes with cortical thickening using a MEDIAL approach. At the conclusion of the procedure a HydroMARK spiral tissue marker clip was deployed into the biopsy cavity. Follow up 2 view mammogram was performed and dictated separately. IMPRESSION: Ultrasound guided biopsy of the 1.3 cm LOWER RIGHT breast mass and abnormal RIGHT axillary lymph node. Ultrasound-guided biopsy of the 2.9 cm INNER LEFT breast mass and 1  of the abnormal LEFT axillary lymph nodes. No apparent complications. Electronically Signed: By: Margarette Canada M.D. On: 04/04/2022 15:32  [place] AXILLARY NODE CORE BIOPSY RIGHT  Addendum Date: 04/05/2022   ADDENDUM REPORT: 04/05/2022 14:04 ADDENDUM: Pathology revealed GRADE III INVASIVE DUCTAL CARCINOMA of the RIGHT breast, 6:00 o'clock, (ribbon clip). This was found to be concordant by Dr. Hassan Rowan. Pathology revealed NO CARCINOMA IDENTIFIED IN NODAL TISSUE of the RIGHT axilla, (hydromark spiral clip). This was found to be concordant by Dr. Hassan Rowan. Pathology  revealed GRADE III INVASIVE MAMMARY CARCINOMA of the LEFT breast, 9:00 o'clock, (ribbon clip). This was found to be concordant by Dr. Hassan Rowan. 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 This was found to be concordant by Dr. Hassan Rowan. Pathology results were discussed with the patient's daughter, Sheila Perez by telephone, per request. The patient's daughter reported her mother did well after the biopsies with tenderness at the sites. Post biopsy instructions and care were reviewed and questions were answered. The patient's daughter was encouraged to call [facility] for any additional concerns. My direct phone number was provided. The patient was referred to [facility] on Apr 12, 2022. NOTE: Stereotactic biopsy of indeterminate RIGHT breast calcifications can be attempted at a later date if this will alter clinical management. Pathology results reported by Terie Purser, RN on 04/05/2022. Electronically Signed   By: Margarette Canada M.D.   On: 04/05/2022 14:04   Result Date: 04/05/2022 CLINICAL DATA:  76 year old female for tissue sampling of 1.3 cm LOWER RIGHT breast mass, abnormal RIGHT axillary lymph node, 2.9 cm INNER LEFT breast mass and abnormal LEFT axillary lymph node. EXAM: ULTRASOUND GUIDED RIGHT BREAST CORE NEEDLE BIOPSY [place] AXILLARY NODE CORE BIOPSY RIGHT ULTRASOUND GUIDED LEFT BREAST CORE NEEDLE BIOPSY [place] AXILLARY NODE CORE BIOPSY LEFT COMPARISON:  Prior studies PROCEDURE: I met with the patient and we discussed the procedure of ultrasound-guided biopsies, including benefits and alternatives. We discussed the high likelihood of successful procedures. We discussed the risks of the procedures, including infection, bleeding, tissue injury, clip migration, and inadequate sampling. Informed written consent was given. The usual time-out protocol  was performed immediately prior to the procedure. ULTRASOUND GUIDED RIGHT BREAST CORE NEEDLE BIOPSY Using sterile technique and 1% Lidocaine as local anesthetic, under direct ultrasound visualization, a 14 gauge spring-loaded device was used to perform biopsy of the 1.3 cm mass at the 6 o'clock position using a LATERAL approach. At the conclusion of the procedure a RIBBON shaped tissue marker clip was deployed into the biopsy cavity. Follow up 2 view mammogram was performed and dictated separately. Skin oozing noted so a Quik clot was placed. [place] AXILLARY NODE CORE BIOPSY RIGHT Using sterile technique and 1% Lidocaine as local anesthetic, under direct ultrasound visualization, a 14 gauge spring-loaded device was used to perform biopsy of the RIGHT axillary lymph node with mild cortical thickening using a LATERAL approach. At the conclusion of the procedure a HydroMARK spiral tissue marker clip was deployed into the biopsy cavity. Follow up 2 view mammogram was performed and dictated separately. ULTRASOUND GUIDED LEFT BREAST CORE NEEDLE BIOPSY Using sterile technique and 1% Lidocaine as local anesthetic, under direct ultrasound visualization, a 14 gauge spring-loaded device was used to perform biopsy of the 2.9 cm mass at the 9 o'clock position of the LEFT breast using a MEDIAL approach. At the conclusion of the  procedure a RIBBON shaped tissue marker clip was deployed into the biopsy cavity. Follow up 2 view mammogram was performed and dictated separately. [place] AXILLARY NODE CORE BIOPSY LEFT Using sterile technique and 1% Lidocaine as local anesthetic, under direct ultrasound visualization, a 14 gauge spring-loaded device was used to perform biopsy of 1 of the abnormal LEFT axillary lymph nodes with cortical thickening using a MEDIAL approach. At the conclusion of the procedure a HydroMARK spiral tissue marker clip was deployed into the biopsy cavity. Follow up 2 view mammogram was performed and dictated separately.  IMPRESSION: Ultrasound guided biopsy of the 1.3 cm LOWER RIGHT breast mass and abnormal RIGHT axillary lymph node. Ultrasound-guided biopsy of the 2.9 cm INNER LEFT breast mass and 1 of the abnormal LEFT axillary lymph nodes. No apparent complications. Electronically Signed: By: Margarette Canada M.D. On: 04/04/2022 15:32  MM CLIP PLACEMENT LEFT  Result Date: 04/04/2022 CLINICAL DATA:  Evaluate placement of RIBBON biopsy clips following bilateral breast biopsies. EXAM: 3D DIAGNOSTIC BILATERAL MAMMOGRAM POST ULTRASOUND BIOPSY COMPARISON:  Previous exam(s). FINDINGS: 3D Mammographic images were obtained following ultrasound guided biopsy of a mass at the 6 o'clock position of the RIGHT breast and a mass at the 9 o'clock position of the LEFT breast. The RIBBON biopsy marking clip is in expected position at the site of biopsy within the LOWER RIGHT breast. The RIBBON biopsy marking clip is in expected position at the site of biopsy within the INNER LEFT breast. The HydroMARK spiral clips placed within the biopsied axillary lymph nodes are not able to be visualized on this study. These HydroMARK clips are satisfactory located within the biopsied axillary lymph nodes, confirmed sonographically. IMPRESSION: Appropriate positioning of the RIBBON shaped biopsy marking clip at the site of biopsy in the LOWER RIGHT breast. Appropriate positioning of the RIBBON shaped biopsy marking clip at the site of biopsy in the INNER RIGHT breast. HydroMARK spiral clips placed within the biopsied bilateral axillary lymph nodes are not visualized on this study, but satisfactory placement was confirmed sonographically. Final Assessment: Post Procedure Mammograms for Marker Placement Electronically Signed   By: Margarette Canada M.D.   On: 04/04/2022 16:04  MM CLIP PLACEMENT RIGHT  Result Date: 04/04/2022 CLINICAL DATA:  Evaluate placement of RIBBON biopsy clips following bilateral breast biopsies. EXAM: 3D DIAGNOSTIC BILATERAL MAMMOGRAM POST  ULTRASOUND BIOPSY COMPARISON:  Previous exam(s). FINDINGS: 3D Mammographic images were obtained following ultrasound guided biopsy of a mass at the 6 o'clock position of the RIGHT breast and a mass at the 9 o'clock position of the LEFT breast. The RIBBON biopsy marking clip is in expected position at the site of biopsy within the LOWER RIGHT breast. The RIBBON biopsy marking clip is in expected position at the site of biopsy within the INNER LEFT breast. The HydroMARK spiral clips placed within the biopsied axillary lymph nodes are not able to be visualized on this study. These HydroMARK clips are satisfactory located within the biopsied axillary lymph nodes, confirmed sonographically. IMPRESSION: Appropriate positioning of the RIBBON shaped biopsy marking clip at the site of biopsy in the LOWER RIGHT breast. Appropriate positioning of the RIBBON shaped biopsy marking clip at the site of biopsy in the INNER RIGHT breast. HydroMARK spiral clips placed within the biopsied bilateral axillary lymph nodes are not visualized on this study, but satisfactory placement was confirmed sonographically. Final Assessment: Post Procedure Mammograms for Marker Placement Electronically Signed   By: Margarette Canada M.D.   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 This was found to be concordant by Dr. Hassan Rowan. Pathology revealed NO CARCINOMA IDENTIFIED IN NODAL TISSUE of the RIGHT axilla, (hydromark spiral clip). This was found to be concordant by Dr. Hassan Rowan. 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 This was found to be concordant by Dr. Hassan Rowan. 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 This  was found to be concordant by Dr. Hassan Rowan. Pathology results were discussed with the patient's daughter, Sheila Perez by telephone, per request. The patient's daughter reported her mother did well after the biopsies with tenderness at the sites. Post biopsy instructions and care were reviewed and questions were answered. The patient's daughter was encouraged to call [facility] for any additional concerns. My direct phone number was provided. The patient was referred to [facility] on Apr 12, 2022. NOTE: Stereotactic biopsy of indeterminate RIGHT breast calcifications can be attempted at a later date if this will alter clinical management. Pathology results reported by Terie Purser, RN on 04/05/2022. Electronically Signed   By: Margarette Canada M.D.   On: 04/05/2022 14:04   Result Date: 04/05/2022 CLINICAL DATA:  76 year old female for tissue sampling of 1.3 cm LOWER RIGHT breast mass, abnormal RIGHT axillary lymph node, 2.9 cm INNER LEFT breast mass and abnormal LEFT axillary lymph node. EXAM: ULTRASOUND GUIDED RIGHT BREAST CORE NEEDLE BIOPSY [place] AXILLARY NODE CORE BIOPSY RIGHT ULTRASOUND GUIDED LEFT BREAST CORE NEEDLE BIOPSY [place] AXILLARY NODE CORE BIOPSY LEFT COMPARISON:  Prior studies PROCEDURE: I met with the patient and we discussed the procedure of ultrasound-guided biopsies, including benefits and alternatives. We discussed the high likelihood of successful procedures. We discussed the risks of the procedures, including infection, bleeding, tissue injury, clip migration, and inadequate sampling. Informed written consent was given. The usual time-out protocol was performed immediately prior to the procedure. ULTRASOUND GUIDED RIGHT BREAST CORE NEEDLE BIOPSY Using sterile technique and 1% Lidocaine as local anesthetic, under direct ultrasound visualization, a 14 gauge spring-loaded device was used to perform  biopsy of the 1.3 cm mass at the 6 o'clock position using a LATERAL approach. At the conclusion of the procedure a RIBBON shaped tissue marker clip was deployed into the biopsy cavity. Follow up 2 view mammogram was performed and dictated separately. Skin oozing noted so a Quik clot was placed. [place] AXILLARY NODE CORE BIOPSY RIGHT Using sterile technique and 1% Lidocaine as local anesthetic, under direct ultrasound visualization, a 14 gauge spring-loaded device was used to perform biopsy of the RIGHT axillary lymph node with mild cortical thickening using a LATERAL approach. At the conclusion of the procedure a HydroMARK spiral tissue marker clip was deployed into the biopsy cavity. Follow up 2 view mammogram was performed and dictated separately. ULTRASOUND GUIDED LEFT BREAST CORE NEEDLE BIOPSY Using sterile technique and 1% Lidocaine as local anesthetic, under direct ultrasound visualization, a 14 gauge spring-loaded device was used to perform biopsy of the 2.9 cm mass at the 9 o'clock position of the LEFT breast using a MEDIAL approach. At the conclusion of the procedure a RIBBON shaped tissue marker clip was deployed into the biopsy cavity. Follow up 2  view mammogram was performed and dictated separately. [place] AXILLARY NODE CORE BIOPSY LEFT Using sterile technique and 1% Lidocaine as local anesthetic, under direct ultrasound visualization, a 14 gauge spring-loaded device was used to perform biopsy of 1 of the abnormal LEFT axillary lymph nodes with cortical thickening using a MEDIAL approach. At the conclusion of the procedure a HydroMARK spiral tissue marker clip was deployed into the biopsy cavity. Follow up 2 view mammogram was performed and dictated separately. IMPRESSION: Ultrasound guided biopsy of the 1.3 cm LOWER RIGHT breast mass and abnormal RIGHT axillary lymph node. Ultrasound-guided biopsy of the 2.9 cm INNER LEFT breast mass and 1 of the abnormal LEFT axillary lymph nodes. No apparent complications.  Electronically Signed: By: Margarette Canada M.D. 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 This was found to be concordant by Dr. Hassan Rowan. Pathology revealed NO CARCINOMA IDENTIFIED IN NODAL TISSUE of the RIGHT axilla, (hydromark spiral clip). This was found to be concordant by Dr. Hassan Rowan. 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 This was found to be concordant by Dr. Hassan Rowan. 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 This was found to be concordant by Dr. Hassan Rowan. Pathology results were discussed with the patient's daughter, Sheila Perez by telephone, per request. The patient's daughter reported her mother did well after the biopsies with tenderness at the sites. Post biopsy instructions and care were reviewed and questions were answered. The patient's daughter was encouraged to call [facility] for any additional concerns. My direct phone number was provided. The patient was referred to [facility] on Apr 12, 2022. NOTE: Stereotactic biopsy of indeterminate RIGHT breast calcifications can be attempted at a later date if this will alter clinical management. Pathology results reported by Terie Purser, RN on 04/05/2022. Electronically Signed   By: Margarette Canada M.D.   On: 04/05/2022 14:04   Result Date: 04/05/2022 CLINICAL DATA:  76 year old female for tissue sampling of 1.3 cm LOWER RIGHT breast mass, abnormal RIGHT axillary lymph node, 2.9 cm INNER LEFT breast mass and abnormal LEFT axillary lymph node. EXAM: ULTRASOUND GUIDED RIGHT BREAST CORE NEEDLE BIOPSY [place] AXILLARY  NODE CORE BIOPSY RIGHT ULTRASOUND GUIDED LEFT BREAST CORE NEEDLE BIOPSY [place] AXILLARY NODE CORE BIOPSY LEFT COMPARISON:  Prior studies PROCEDURE: I met with the patient and we discussed the procedure of ultrasound-guided biopsies, including benefits and alternatives. We discussed the high likelihood of successful procedures. We discussed the risks of the procedures, including infection, bleeding, tissue injury, clip migration, and inadequate sampling. Informed written consent was given. The usual time-out protocol was performed immediately prior to the procedure. ULTRASOUND GUIDED RIGHT BREAST CORE NEEDLE BIOPSY Using sterile technique and 1% Lidocaine as local anesthetic, under direct ultrasound visualization, a 14 gauge spring-loaded device was used to perform biopsy of the 1.3 cm mass at the 6 o'clock position using a LATERAL approach. At the conclusion of the procedure a RIBBON shaped tissue marker clip was deployed into the biopsy cavity. Follow up 2 view mammogram was performed and dictated separately. Skin oozing noted so a Quik clot was placed. [place] AXILLARY NODE CORE BIOPSY RIGHT  Using sterile technique and 1% Lidocaine as local anesthetic, under direct ultrasound visualization, a 14 gauge spring-loaded device was used to perform biopsy of the RIGHT axillary lymph node with mild cortical thickening using a LATERAL approach. At the conclusion of the procedure a HydroMARK spiral tissue marker clip was deployed into the biopsy cavity. Follow up 2 view mammogram was performed and dictated separately. ULTRASOUND GUIDED LEFT BREAST CORE NEEDLE BIOPSY Using sterile technique and 1% Lidocaine as local anesthetic, under direct ultrasound visualization, a 14 gauge spring-loaded device was used to perform biopsy of the 2.9 cm mass at the 9 o'clock position of the LEFT breast using a MEDIAL approach. At the conclusion of the procedure a RIBBON shaped tissue marker clip was deployed into the biopsy cavity. Follow up 2 view  mammogram was performed and dictated separately. [place] AXILLARY NODE CORE BIOPSY LEFT Using sterile technique and 1% Lidocaine as local anesthetic, under direct ultrasound visualization, a 14 gauge spring-loaded device was used to perform biopsy of 1 of the abnormal LEFT axillary lymph nodes with cortical thickening using a MEDIAL approach. At the conclusion of the procedure a HydroMARK spiral tissue marker clip was deployed into the biopsy cavity. Follow up 2 view mammogram was performed and dictated separately. IMPRESSION: Ultrasound guided biopsy of the 1.3 cm LOWER RIGHT breast mass and abnormal RIGHT axillary lymph node. Ultrasound-guided biopsy of the 2.9 cm INNER LEFT breast mass and 1 of the abnormal LEFT axillary lymph nodes. No apparent complications. Electronically Signed: By: Margarette Canada M.D. On: 04/04/2022 15:32     IMPRESSION/PLAN: ***   It was a pleasure meeting the patient today. We discussed the risks, benefits, and side effects of radiotherapy. I recommend radiotherapy to the *** to reduce her risk of locoregional recurrence by 2/3.  We discussed that radiation would take approximately *** weeks to complete and that I would give the patient a few weeks to heal following surgery before starting treatment planning. *** If chemotherapy were to be given, this would precede radiotherapy. We spoke about acute effects including skin irritation and fatigue as well as much less common late effects including internal organ injury or irritation. We spoke about the latest technology that is used to minimize the risk of late effects for patients undergoing radiotherapy to the breast or chest wall. No guarantees of treatment were given. The patient is enthusiastic about proceeding with treatment. I look forward to participating in the patient's care.  I will await her referral back to me for postoperative follow-up and eventual CT simulation/treatment planning.  On date of service, in total, I spent ***  minutes on this encounter. Patient was seen in person.   __________________________________________   Eppie Gibson, MD  This document serves as a record of services personally performed by Eppie Gibson, MD. It was created on her behalf by Roney Mans, a trained medical scribe. The creation of this record is based on the scribe's personal observations and the provider's statements to them. This document has been checked and approved by the attending provider.

## 2022-04-12 ENCOUNTER — Inpatient Hospital Stay: Payer: Medicare Other | Attending: Hematology and Oncology | Admitting: Hematology and Oncology

## 2022-04-12 ENCOUNTER — Inpatient Hospital Stay (HOSPITAL_BASED_OUTPATIENT_CLINIC_OR_DEPARTMENT_OTHER): Payer: Medicare Other | Admitting: Genetic Counselor

## 2022-04-12 ENCOUNTER — Encounter: Payer: Self-pay | Admitting: *Deleted

## 2022-04-12 ENCOUNTER — Ambulatory Visit
Admission: RE | Admit: 2022-04-12 | Discharge: 2022-04-12 | Disposition: A | Discharge status: Home / Self Care | Payer: Medicare Other | Source: Ambulatory Visit | Attending: Radiation Oncology | Admitting: Radiation Oncology

## 2022-04-12 ENCOUNTER — Ambulatory Visit: Payer: Medicare Other | Admitting: Physical Therapy

## 2022-04-12 ENCOUNTER — Other Ambulatory Visit: Payer: Self-pay

## 2022-04-12 ENCOUNTER — Other Ambulatory Visit: Payer: Self-pay | Admitting: *Deleted

## 2022-04-12 ENCOUNTER — Encounter: Payer: Self-pay | Admitting: General Practice

## 2022-04-12 ENCOUNTER — Inpatient Hospital Stay: Payer: Medicare Other

## 2022-04-12 DIAGNOSIS — C773 Secondary and unspecified malignant neoplasm of axilla and upper limb lymph nodes: Secondary | ICD-10-CM

## 2022-04-12 DIAGNOSIS — C50412 Malignant neoplasm of upper-outer quadrant of left female breast: Secondary | ICD-10-CM | POA: Diagnosis not present

## 2022-04-12 DIAGNOSIS — Z170 Estrogen receptor positive status [ER+]: Secondary | ICD-10-CM | POA: Diagnosis not present

## 2022-04-12 DIAGNOSIS — C50511 Malignant neoplasm of lower-outer quadrant of right female breast: Secondary | ICD-10-CM

## 2022-04-12 DIAGNOSIS — Z803 Family history of malignant neoplasm of breast: Secondary | ICD-10-CM

## 2022-04-12 DIAGNOSIS — C50212 Malignant neoplasm of upper-inner quadrant of left female breast: Secondary | ICD-10-CM

## 2022-04-12 DIAGNOSIS — Z8041 Family history of malignant neoplasm of ovary: Secondary | ICD-10-CM

## 2022-04-12 DIAGNOSIS — Z853 Personal history of malignant neoplasm of breast: Secondary | ICD-10-CM | POA: Diagnosis not present

## 2022-04-12 LAB — CBC WITH DIFFERENTIAL (CANCER CENTER ONLY)
Abs Immature Granulocytes: 0.01 10*3/uL (ref 0.00–0.07)
Basophils Absolute: 0.1 10*3/uL (ref 0.0–0.1)
Basophils Relative: 1 %
Eosinophils Absolute: 0.2 10*3/uL (ref 0.0–0.5)
Eosinophils Relative: 3 %
HCT: 38.4 % (ref 36.0–46.0)
Hemoglobin: 13.1 g/dL (ref 12.0–15.0)
Immature Granulocytes: 0 %
Lymphocytes Relative: 33 %
Lymphs Abs: 1.9 10*3/uL (ref 0.7–4.0)
MCH: 31 pg (ref 26.0–34.0)
MCHC: 34.1 g/dL (ref 30.0–36.0)
MCV: 90.8 fL (ref 80.0–100.0)
Monocytes Absolute: 0.5 10*3/uL (ref 0.1–1.0)
Monocytes Relative: 9 %
Neutro Abs: 3.1 10*3/uL (ref 1.7–7.7)
Neutrophils Relative %: 54 %
Platelet Count: 227 10*3/uL (ref 150–400)
RBC: 4.23 MIL/uL (ref 3.87–5.11)
RDW: 13.6 % (ref 11.5–15.5)
WBC Count: 5.6 10*3/uL (ref 4.0–10.5)
nRBC: 0 % (ref 0.0–0.2)

## 2022-04-12 LAB — CMP (CANCER CENTER ONLY)
ALT: 7 U/L (ref 0–44)
AST: 12 U/L — ABNORMAL LOW (ref 15–41)
Albumin: 4 g/dL (ref 3.5–5.0)
Alkaline Phosphatase: 66 U/L (ref 38–126)
Anion gap: 8 (ref 5–15)
BUN: 11 mg/dL (ref 8–23)
CO2: 28 mmol/L (ref 22–32)
Calcium: 9.7 mg/dL (ref 8.9–10.3)
Chloride: 104 mmol/L (ref 98–111)
Creatinine: 0.92 mg/dL (ref 0.44–1.00)
GFR, Estimated: >60 mL/min (ref >60)
Glucose, Bld: 105 mg/dL — ABNORMAL HIGH (ref 70–99)
Potassium: 3.8 mmol/L (ref 3.5–5.1)
Sodium: 140 mmol/L (ref 135–145)
Total Bilirubin: 1 mg/dL (ref 0.3–1.2)
Total Protein: 7.3 g/dL (ref 6.5–8.1)

## 2022-04-12 LAB — GENETIC SCREENING ORDER

## 2022-04-12 NOTE — Assessment & Plan Note (Signed)
Palpable left breast mass 9 o'clock position 2.9 cm, multiple left axillary lymph nodes with skin thickening biopsy: Positive lymph node, biopsy of the breast mass: Grade 3 IDC ER 70%, PR 5%, HER2 equivocal, FISH pending, Ki-67 30%  Right breast mass 1.3 cm with calcifications 2 cm (calcs were not biopsied), biopsy grade 3 IDC ER 100%, PR 30%, HER2 equivocal by IHC, FISH pending, Ki-67 70%, 1 abnormal lymph node biopsy: Benign concordant  Pathology and radiology counseling: Discussed with the patient, the details of pathology including the type of breast cancer,the clinical staging, the significance of ER, PR and HER-2/neu receptors and the implications for treatment. After reviewing the pathology in detail, we proceeded to discuss the different treatment options between surgery, radiation, chemotherapy, antiestrogen therapies.  Treatment plan: 1.  Bilateral lumpectomies versus bilateral mastectomies with targeted node dissection 2. adjuvant versus neoadjuvant chemotherapy based upon HER2 result 3.  Adjuvant radiation 4.  Adjuvant antiestrogen therapy 5.  Genetics  With the multiple positive lymph nodes on the left side, we are concerned for the cancer being high risk.  If the HER2 is negative we will consider sending Oncotype Dx We may also need to consider doing a breast MRI for further evaluation.

## 2022-04-12 NOTE — Progress Notes (Signed)
[place] Psychosocial Distress Screening Spiritual Care  Met with HUOHFGBM and her daughter Theodis Shove in [facility] to introduce [place] team/resources, reviewing distress screen per protocol.  The patient scored a  [unspecified]  on the Psychosocial Distress Thermometer which indicates  [unspecified]  distress. Also assessed for distress and other psychosocial needs.    04/12/2022  ONCBCN DISTRESS SCREENING   Emotional problem type Nervousness/Anxiety   Referral to support programs Yes    Ms Keleher and her daughter were feeling down and worried about diagnosis at the time of this encounter, which we kept brief because Ms Flow was growing physically uncomfortable.  Provided empathic listening, normalization of feelings, and encouragement to look through packet of [place] and [employer] information.  Follow up needed: Yes.  We plan to follow up by phone next week to speak in more detail after they have had more time to process.   [place], [place], [facility] Pager [phone] Voicemail [phone]

## 2022-04-13 ENCOUNTER — Encounter: Payer: Self-pay | Admitting: Genetic Counselor

## 2022-04-13 ENCOUNTER — Telehealth: Payer: Self-pay | Admitting: Hematology and Oncology

## 2022-04-13 DIAGNOSIS — Z8041 Family history of malignant neoplasm of ovary: Secondary | ICD-10-CM | POA: Insufficient documentation

## 2022-04-13 DIAGNOSIS — C801 Malignant (primary) neoplasm, unspecified: Secondary | ICD-10-CM

## 2022-04-13 DIAGNOSIS — Z803 Family history of malignant neoplasm of breast: Secondary | ICD-10-CM | POA: Insufficient documentation

## 2022-04-13 HISTORY — DX: Malignant (primary) neoplasm, unspecified: C80.1

## 2022-04-13 NOTE — Telephone Encounter (Signed)
Scheduled appointment per 05/31 los. Patient aware.

## 2022-04-13 NOTE — Progress Notes (Signed)
REFERRING PROVIDER: Nicholas Lose, MD [place], [place] [address]  PRIMARY PROVIDER:  [name], [name], MD  PRIMARY REASON FOR VISIT:  1. Malignant neoplasm of lower-outer quadrant of right breast of female, estrogen receptor positive ([place])   2. Malignant neoplasm of upper-inner quadrant of left breast in female, estrogen receptor positive ([place])   3. Family history of breast cancer   4. Family history of ovarian cancer     HISTORY OF PRESENT ILLNESS:   Sheila Perez, a 76 y.o. female, was seen for a  cancer genetics consultation at the request of Dr. Lindi Adie due to a personal and family history of cancer.  Sheila Perez presents to clinic today to discuss the possibility of a hereditary predisposition to cancer, to discuss genetic testing, and to further clarify her future cancer risks, as well as potential cancer risks for family members.   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   CANCER HISTORY:  Oncology History  Malignant neoplasm of lower-outer quadrant of right breast of female, estrogen receptor positive ([place])  04/04/2022 Initial Diagnosis   Right breast mass 1.3 cm with calcifications 2 cm (calcs were not biopsied), biopsy grade 3 IDC ER 100%, PR 30%, HER2 equivocal by IHC, FISH pending, Ki-67 70%, 1 abnormal lymph node biopsy: Benign concordant   04/12/2022 Cancer Staging   Staging form: Breast, AJCC 8th Edition - Clinical: Stage IA (cT1c, cN0, cM0, G3, ER+, PR+, HER2: Equivocal) - Signed by Nicholas Lose, MD on 04/12/2022 Stage prefix: Initial diagnosis Histologic grading system: 3 grade system    Malignant neoplasm of upper-inner quadrant of left breast in female, estrogen receptor positive ([place])  04/04/2022 Initial Diagnosis   Palpable left breast mass 9 o'clock position 2.9 cm, multiple left axillary lymph nodes with skin  thickening biopsy: Positive lymph node, biopsy of the breast mass: Grade 3 IDC ER 70%, PR 5%, HER2 equivocal, FISH pending, Ki-67 30%   04/12/2022 Cancer Staging   Staging form: Breast, AJCC 8th Edition - Clinical: Stage IIB (cT2, cN1, cM0, G3, ER+, PR+, HER2: Equivocal) - Signed by Nicholas Lose, MD on 04/12/2022 Stage prefix: Initial diagnosis Histologic grading system: 3 grade system      Past Medical History:  Diagnosis Date   Blood transfusion without reported diagnosis    Diabetes mellitus without complication ([place])    prediabetic-no medications   Gait abnormality    Hyperlipidemia    Hypertension    Neuromuscular disorder ([place])    never damage for the way she walks   Stroke [facility])    mimi    Past Surgical History:  Procedure Laterality Date   ABDOMINAL HYSTERECTOMY     maxilo facial surgery      Social History   Socioeconomic History   Marital status: Widowed    Spouse name: Not on file   Number of children: 2   Years of education: 10   Highest education level: 10th grade  Occupational History   Occupation: Retired  Tobacco Use   Smoking status: Never   Smokeless tobacco: Never  Vaping Use   Vaping Use: Never used  Substance and Sexual Activity   Alcohol use: No   Drug use: No   Sexual activity: Not Currently  Other Topics Concern   Not on file  Social History Narrative   ** Merged History Encounter **  Right handed Lives daughter in a two story home but lives on first floor Drinks no caffeine    Social Determinants of Health   Financial Resource Strain: Low Risk    Difficulty of Paying Living Expenses: Not hard at all  Food Insecurity: No Food Insecurity   Worried About Charity fundraiser in the Last Year: Never true   Arboriculturist in the Last Year: Never true  Transportation Needs: No Transportation Needs   Lack of Transportation (Medical): No   Lack of Transportation (Non-Medical): No  Physical Activity: Inactive   Days of Exercise  per Week: 0 days   Minutes of Exercise per Session: 0 min  Stress: No Stress Concern Present   Feeling of Stress : Not at all  Social Connections: Socially Isolated   Frequency of Communication with Friends and Family: More than three times a week   Frequency of Social Gatherings with Friends and Family: More than three times a week   Attends Religious Services: Never   Marine scientist or Organizations: No   Attends Archivist Meetings: Never   Marital Status: Widowed     FAMILY HISTORY:  We obtained a detailed, 4-generation family history.  Significant diagnoses are listed below: Family History  Problem Relation Age of Onset   Breast cancer Sister    Ovarian cancer Sister 67   Breast cancer Sister    Breast cancer Niece      Sheila Perez's sister was diagnosed with breast cancer at an unknown age, she is deceased. A second sister was diagnosed with ovarian cancer at age 70 and died at age 63. A third sister was diagnosed with breast cancer at an unknown age and this sister's daughter (Sheila Perez niece) was diagnosed with breast cancer and reportedly had negative genetic testing.   GENETIC COUNSELING ASSESSMENT: Sheila Perez is a 76 y.o. female with a personal and family history of cancer which is somewhat suggestive of a hereditary predisposition to cancer.  DISCUSSION: We discussed that 5 - 10% of cancer is hereditary, with most cases of breast and ovarian cancer associated with BRCA1/2.  There are other genes that can be associated with hereditary breast and ovarian cancer syndromes.  We discussed that testing is beneficial for several reasons including knowing how to follow individuals after completing their treatment, identifying whether potential treatment options would be beneficial, and understanding if other family members could be at risk for cancer and allowing them to undergo genetic testing.   We reviewed the characteristics, features and inheritance  patterns of hereditary cancer syndromes. We also discussed genetic testing, including the appropriate family members to test, the process of testing, insurance coverage and turn-around-time for results. We discussed the implications of a negative, positive, carrier and/or variant of uncertain significant result. We recommended Sheila Perez pursue genetic testing for a panel that includes genes associated with breast and ovarian cancer.   Sheila Perez elected to have [facility] CancerNext Panel. The CancerNext gene panel offered by [facility] includes sequencing, rearrangement analysis, and RNA analysis for the following 36 genes:   APC, ATM, AXIN2, BARD1, BMPR1A, BRCA1, BRCA2, BRIP1, CDH1, CDK4, CDKN2A, CHEK2, DICER1, HOXB13, EPCAM, GREM1, MLH1, MSH2, MSH3, MSH6, MUTYH, NBN, NF1, NTHL1, PALB2, PMS2, POLD1, POLE, PTEN, RAD51C, RAD51D, RECQL, SMAD4, SMARCA4, STK11, and TP53.   Based on Ms. Suastegui's personal and family history of cancer, she meets medical criteria for genetic testing. Despite that she meets criteria, she may still have an out  of pocket cost. We discussed that if her out of pocket cost for testing is over $100, the laboratory will call and confirm whether she wants to proceed with testing.  If the out of pocket cost of testing is less than $100 she will be billed by the genetic testing laboratory.   PLAN: After considering the risks, benefits, and limitations, Sheila Perez provided informed consent to pursue genetic testing and the blood sample was sent to [facility] for analysis of the CancerNext Panel. Results should be available within approximately 2-3 weeks' time, at which point they will be disclosed by telephone to Sheila Perez, as will any additional recommendations warranted by these results. Sheila Perez will receive a summary of her genetic counseling visit and a copy of her results once available. This information will also be available in Epic.   Sheila Perez questions  were answered to her satisfaction today. Our contact information was provided should additional questions or concerns arise. Thank you for the referral and allowing [place] to share in the care of your patient.   Lucille Passy, MS, [facility] Genetic Counselor [email or web address] (P) [phone]  The patient was seen for a total of 20 minutes in face-to-face genetic counseling.  The patient brought her daughter. Drs. Lindi Adie and/or Burr Medico were available to discuss this case as needed.   _______________________________________________________________________ For Office Staff:  Number of people involved in session: 2 Was an Intern/ student involved with case: no

## 2022-04-13 NOTE — Telephone Encounter (Signed)
Spoke to patient's daughter to give updated appointment date and times, also emailed an updated calendar.

## 2022-04-14 ENCOUNTER — Encounter: Payer: Self-pay | Admitting: Radiation Oncology

## 2022-04-17 ENCOUNTER — Ambulatory Visit (HOSPITAL_COMMUNITY): Payer: Medicare Other

## 2022-04-18 ENCOUNTER — Ambulatory Visit (HOSPITAL_COMMUNITY)
Admission: RE | Admit: 2022-04-18 | Discharge: 2022-04-18 | Disposition: A | Discharge status: Home / Self Care | Payer: Medicare Other | Source: Ambulatory Visit | Attending: Hematology and Oncology | Admitting: Hematology and Oncology

## 2022-04-18 ENCOUNTER — Encounter (HOSPITAL_COMMUNITY): Payer: Self-pay

## 2022-04-18 DIAGNOSIS — E785 Hyperlipidemia, unspecified: Secondary | ICD-10-CM | POA: Insufficient documentation

## 2022-04-18 DIAGNOSIS — C50511 Malignant neoplasm of lower-outer quadrant of right female breast: Secondary | ICD-10-CM

## 2022-04-18 DIAGNOSIS — I10 Essential (primary) hypertension: Secondary | ICD-10-CM | POA: Insufficient documentation

## 2022-04-18 DIAGNOSIS — C50212 Malignant neoplasm of upper-inner quadrant of left female breast: Secondary | ICD-10-CM

## 2022-04-18 DIAGNOSIS — I351 Nonrheumatic aortic (valve) insufficiency: Secondary | ICD-10-CM | POA: Diagnosis not present

## 2022-04-18 DIAGNOSIS — E119 Type 2 diabetes mellitus without complications: Secondary | ICD-10-CM | POA: Diagnosis not present

## 2022-04-18 DIAGNOSIS — Z5111 Encounter for antineoplastic chemotherapy: Secondary | ICD-10-CM | POA: Diagnosis not present

## 2022-04-18 DIAGNOSIS — C50512 Malignant neoplasm of lower-outer quadrant of left female breast: Secondary | ICD-10-CM | POA: Insufficient documentation

## 2022-04-18 DIAGNOSIS — Z170 Estrogen receptor positive status [ER+]: Secondary | ICD-10-CM | POA: Diagnosis not present

## 2022-04-18 DIAGNOSIS — Z0189 Encounter for other specified special examinations: Secondary | ICD-10-CM

## 2022-04-18 LAB — ECHOCARDIOGRAM COMPLETE
AR max vel: 1.73 cm2
AV Area VTI: 1.79 cm2
AV Area mean vel: 1.74 cm2
AV Mean grad: 3 mmHg
AV Peak grad: 6.5 mmHg
Ao pk vel: 1.27 m/s
Area-P 1/2: 3.23 cm2
P 1/2 time: 425 msec
S' Lateral: 2.6 cm

## 2022-04-18 IMAGING — CT CT CHEST W/ CM
2 of 5 series · 13 of 36 positions shown, 16 images · IV contrast (OMNIPAQUE)
Comparison: None Available.

CLINICAL DATA: Breast cancer, invasive, stage I/II/III, initial
workup new breast cancer staging/multiple metastatic lymph nodes.

* Tracking Code: BO *
EXAM:
CT CHEST, ABDOMEN, AND PELVIS WITH CONTRAST
TECHNIQUE: Multidetector CT imaging of the chest, abdomen and pelvis was
performed following the standard protocol during bolus
administration of intravenous contrast.

[Series 2: cap with · axial · 0.77mm/px · z∈[-409,+41]mm · 10 of 112 slices shown, 13 images]
[im 11/112  mediastinal]
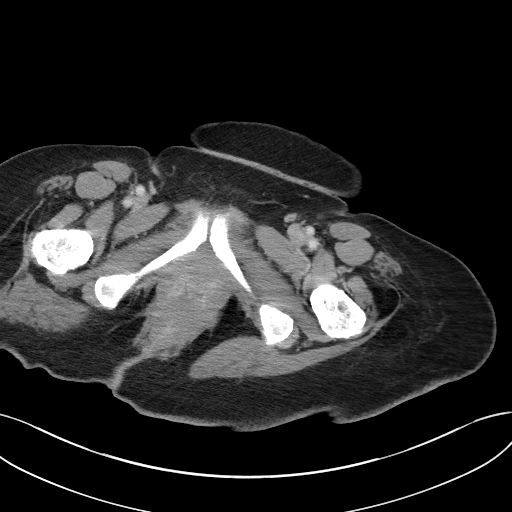
[im 11/112  lung]
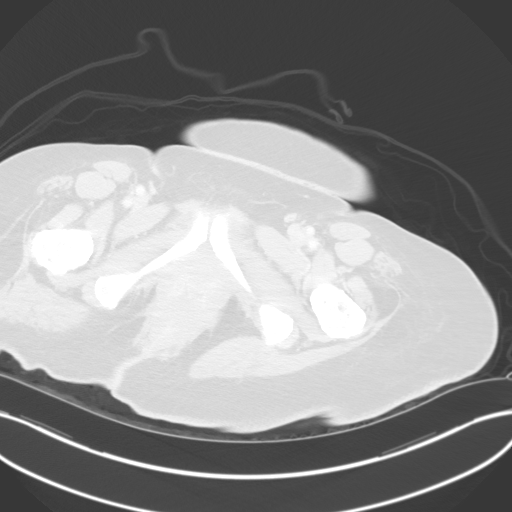
[im 21/112  lung]
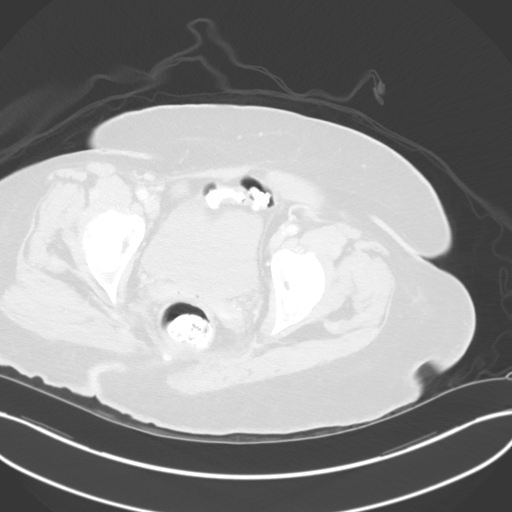
[im 31/112  lung]
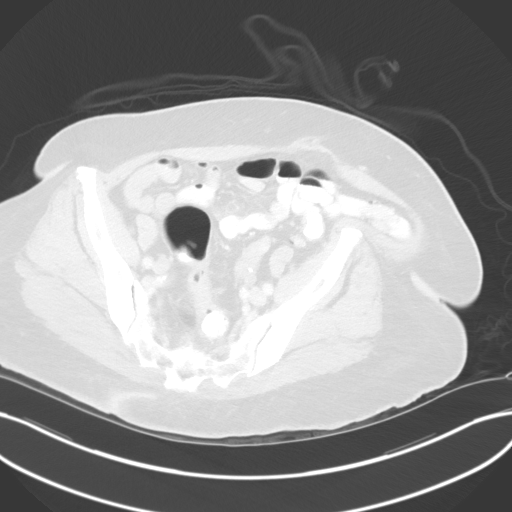
[im 41/112  lung]
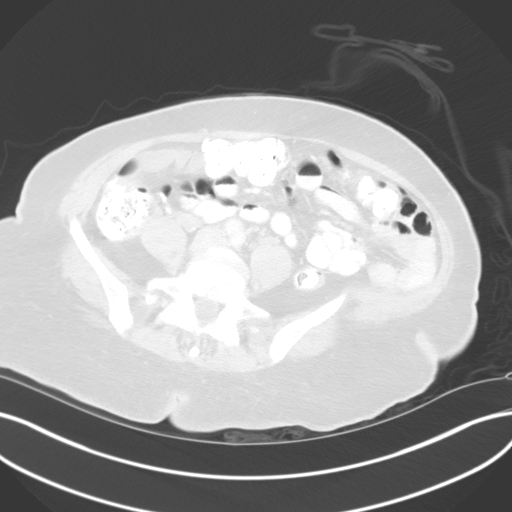
[im 51/112  mediastinal]
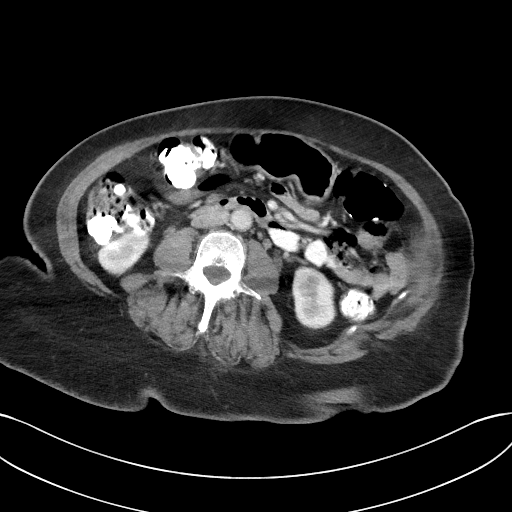
[im 51/112  lung]
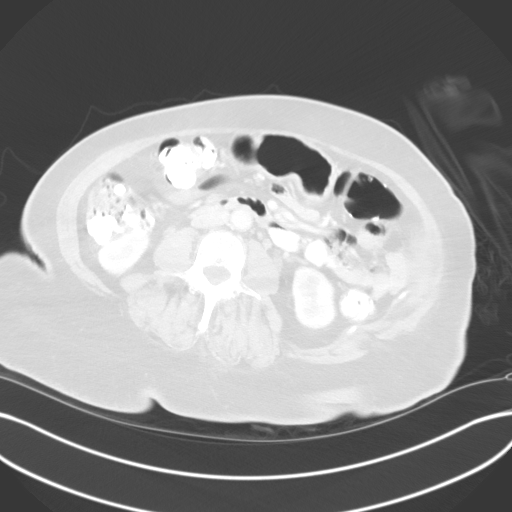
[im 61/112  lung]
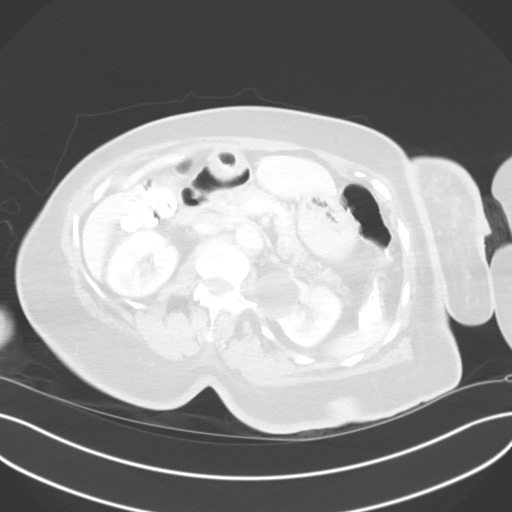
[im 71/112  lung]
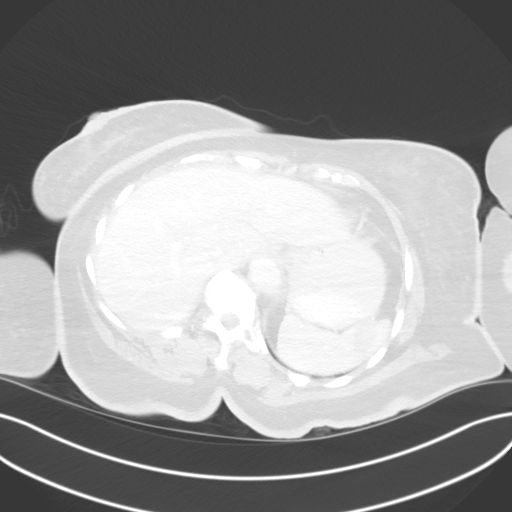
[im 81/112  lung]
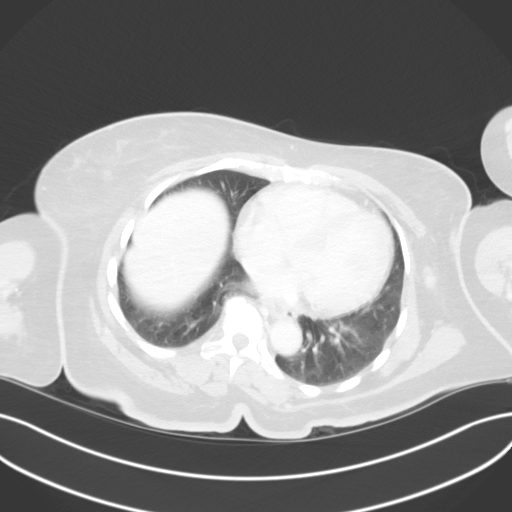
[im 91/112  mediastinal]
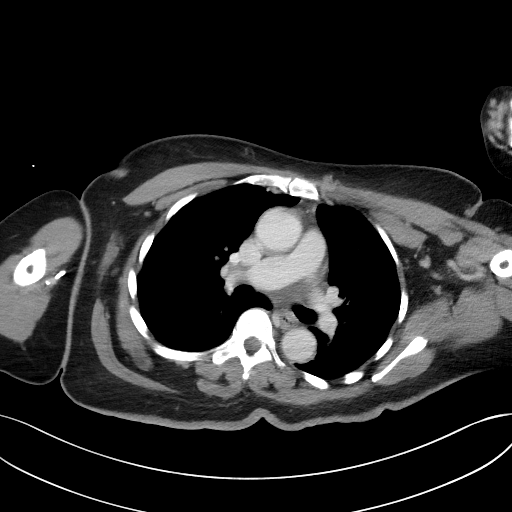
[im 91/112  lung]
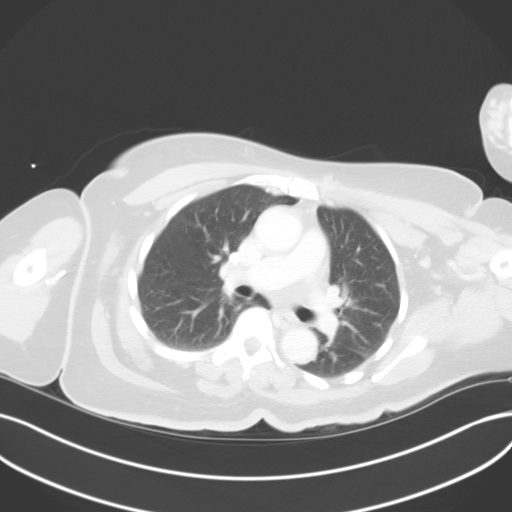
[im 101/112  lung]
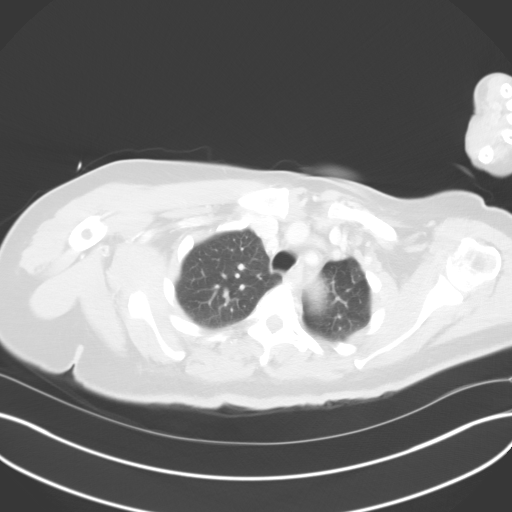

[Series 5: coronals · coronal · 0.82mm/px · 3 of 141 slices shown]
[im 29/141  lung]
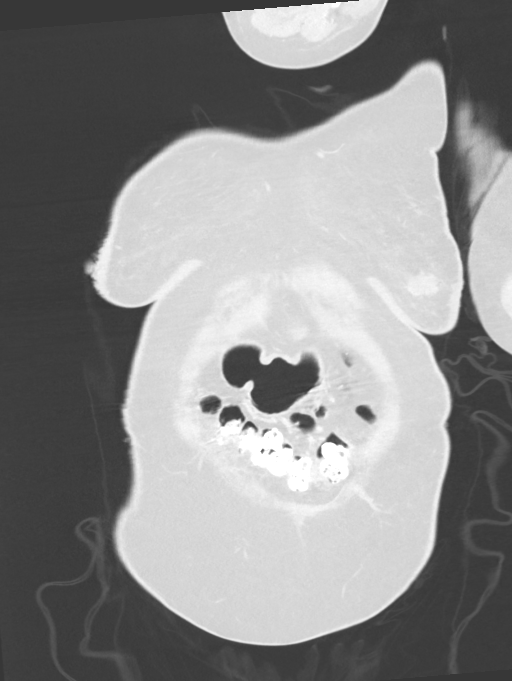
[im 57/141  lung]
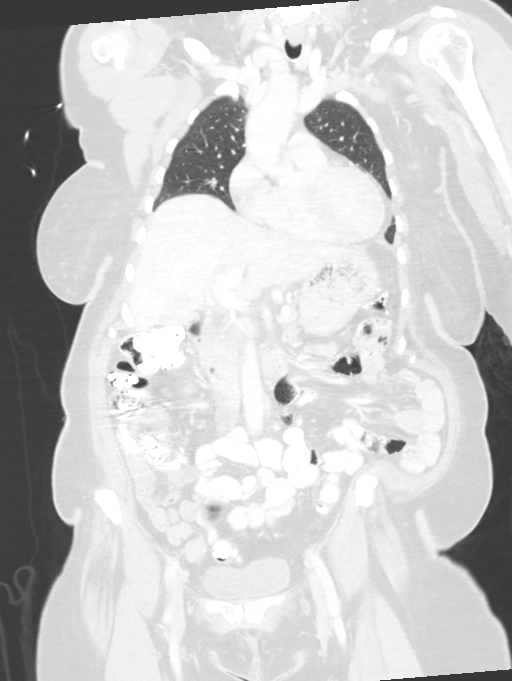
[im 85/141  lung]
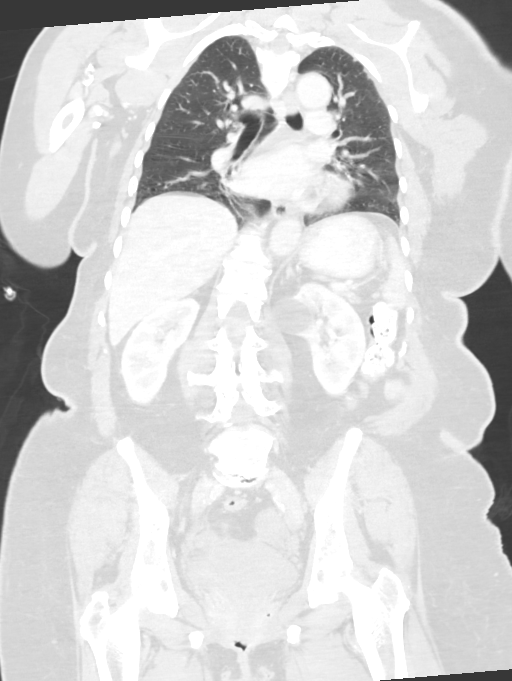

[13 of 36 positions shown; findings below may reference images not displayed]

RADIATION DOSE REDUCTION: This exam was performed according to the
departmental dose-optimization program which includes automated
exposure control, adjustment of the mA and/or kV according to
patient size and/or use of iterative reconstruction technique.

CONTRAST:  100 cc OMNIPAQUE IOHEXOL 300 MG/ML  SOLN
FINDINGS: CT CHEST FINDINGS

Cardiovascular: Top normal heart size. No significant pericardial
effusion/thickening. Atherosclerotic nonaneurysmal thoracic aorta.
Normal caliber pulmonary arteries. No central pulmonary emboli.

Mediastinum/Nodes: No discrete thyroid nodules. Unremarkable
esophagus. A few enlarged left axillary lymph nodes, largest 1.3 cm
short axis diameter (series 2/image 30). No pathologically enlarged
right axillary nodes. No mediastinal or hilar adenopathy.

Lungs/Pleura: No pneumothorax. No pleural effusion. No acute
consolidative airspace disease, lung masses or significant pulmonary
nodules.

Musculoskeletal: No aggressive appearing focal osseous lesions. Mild
thoracic spondylosis. Solid 2.8 x 2.7 cm left breast mass (series
2/image 49). Solid 1.3 x 1.1 cm right breast mass (series 2/image
45) with tiny internal calcification.

CT ABDOMEN PELVIS FINDINGS

Hepatobiliary: Normal liver with no liver mass. Layering hyperdense
material in the gallbladder favoring sludge with no discrete
radiopaque gallstones. Phrygian cap versus focal adenomyomatosis in
the fundal gallbladder wall. No definite gallbladder wall
thickening. No pericholecystic fluid. No biliary ductal dilatation.

Pancreas: Normal, with no mass or duct dilation.

Spleen: Normal size spleen. Hypodense 2.4 cm splenic mass (series
2/image 42). No additional splenic lesions.

Adrenals/Urinary Tract: Right adrenal 1.1 cm nodule with density 69
HU to. No discrete left adrenal lesions. No hydronephrosis. Simple
bilateral renal cysts, largest 3.7 cm in the medial upper left
kidney, for which no follow-up is recommended. Normal bladder.

Stomach/Bowel: Normal non-distended stomach. Normal caliber small
bowel with no small bowel wall thickening. Normal appendix. Oral
contrast transits to the rectum. Normal large bowel with no
diverticulosis, large bowel wall thickening or pericolonic fat
stranding.

Vascular/Lymphatic: Atherosclerotic nonaneurysmal abdominal aorta.
Patent portal, splenic, hepatic and renal veins. No pathologically
enlarged lymph nodes in the abdomen or pelvis.

Reproductive: Hysterectomy. Solid 5.2 x 2.8 cm left adnexal mass
(series 2/image 85). No right adnexal mass.

Other: No pneumoperitoneum, ascites or focal fluid collection.

Musculoskeletal: No aggressive appearing focal osseous lesions.
Marked degenerative disc disease at L5-S1.
IMPRESSION: 1. Bilateral breast masses, measuring 2.8 cm on the left and 1.3 cm
on the right.
2. Left axillary lymphadenopathy, compatible with nodal metastases.
3. Right adrenal 1.1 cm nodule, indeterminate, cannot exclude
adrenal metastasis.
4. Hypodense 2.4 cm splenic mass, nonspecific, cannot exclude
splenic metastasis.
5. Solid 5.2 cm left adnexal mass, suspicious for left ovarian
neoplasm or metastasis. Pelvic ultrasound correlation may be
considered.
6. Aortic Atherosclerosis ([ZV]-[ZV]).

## 2022-04-18 IMAGING — CT CT ABD-PELV W/ CM
2 of 6 series · 13 of 36 positions shown, 16 images · IV contrast (OMNIPAQUE)
Comparison: None Available.

CLINICAL DATA: Breast cancer, invasive, stage I/II/III, initial
workup new breast cancer staging/multiple metastatic lymph nodes.

* Tracking Code: BO *
EXAM:
CT CHEST, ABDOMEN, AND PELVIS WITH CONTRAST
TECHNIQUE: Multidetector CT imaging of the chest, abdomen and pelvis was
performed following the standard protocol during bolus
administration of intravenous contrast.

[Series 505: thins · axial · 0.77mm/px · z∈[-435,+68]mm · 10 of 798 slices shown, 13 images]
[im 40/798  mediastinal]
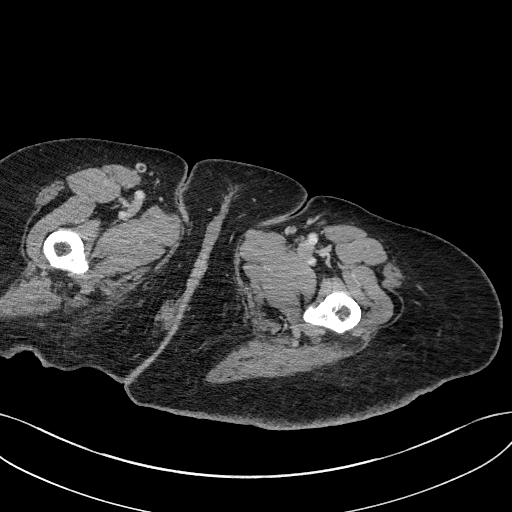
[im 40/798  lung]
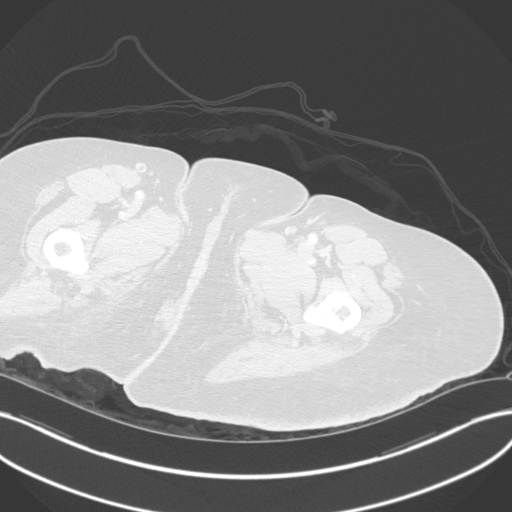
[im 120/798  lung]
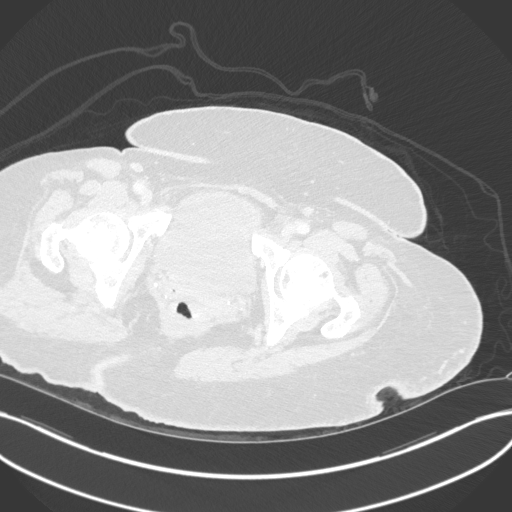
[im 200/798  lung]
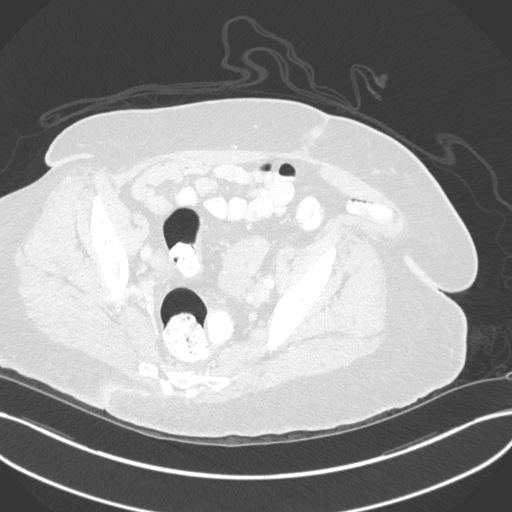
[im 279/798  lung]
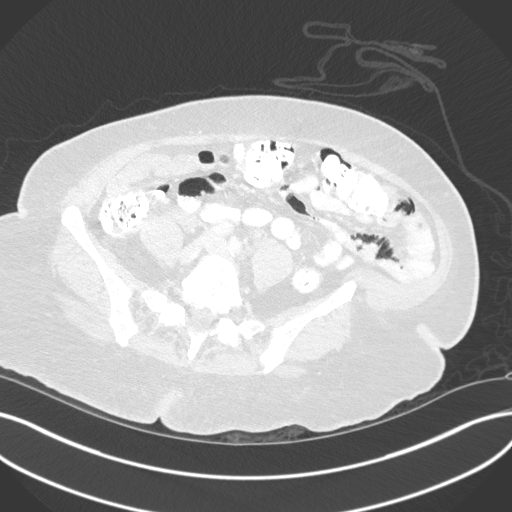
[im 359/798  mediastinal]
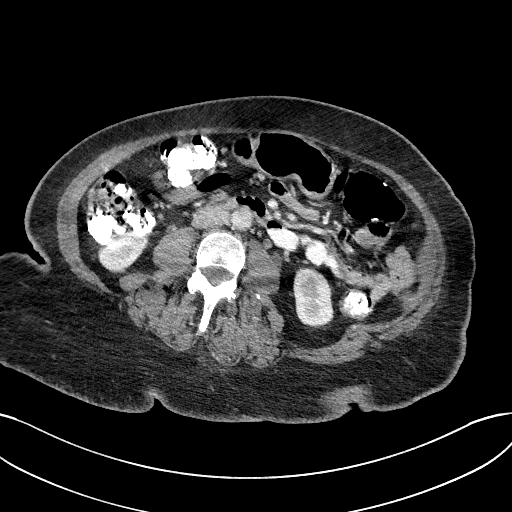
[im 359/798  lung]
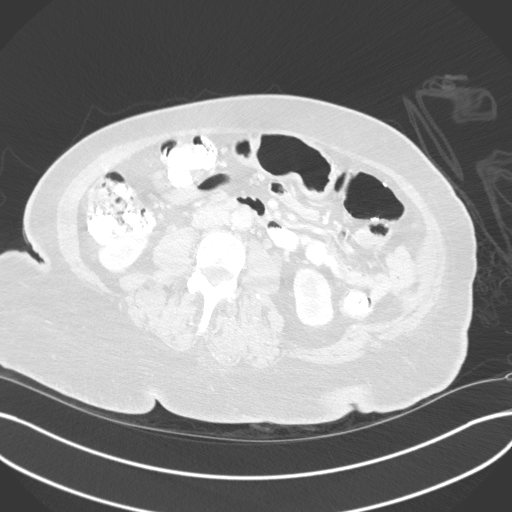
[im 439/798  lung]
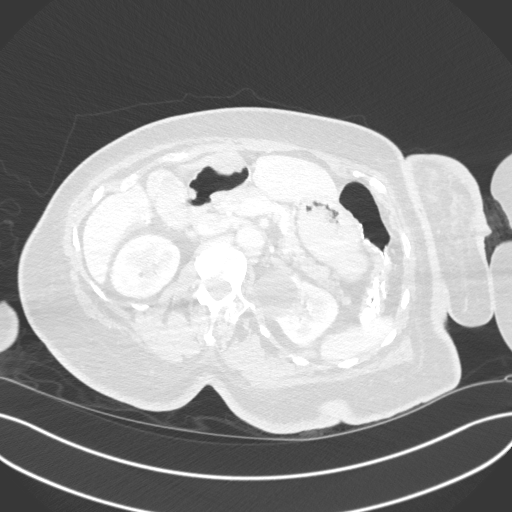
[im 519/798  lung]
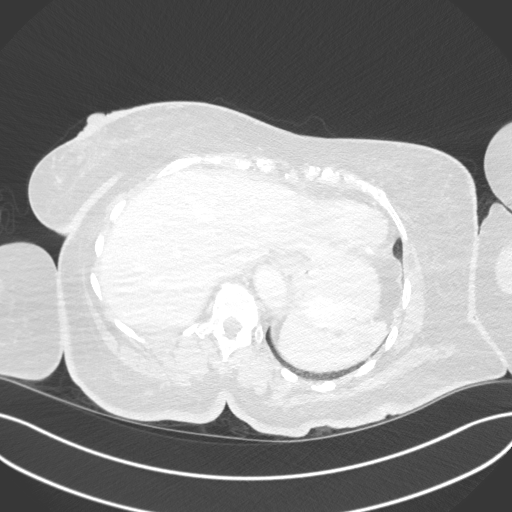
[im 598/798  lung]
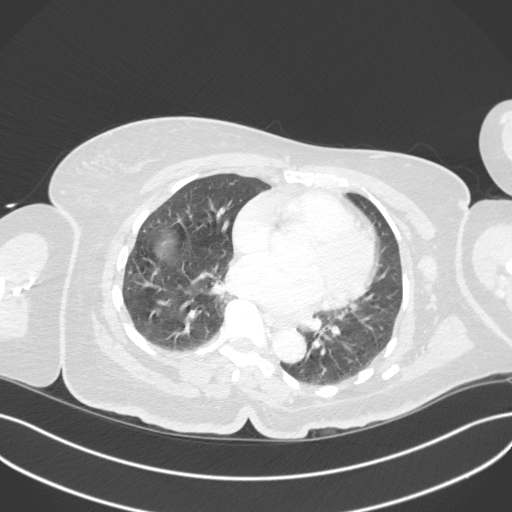
[im 678/798  mediastinal]
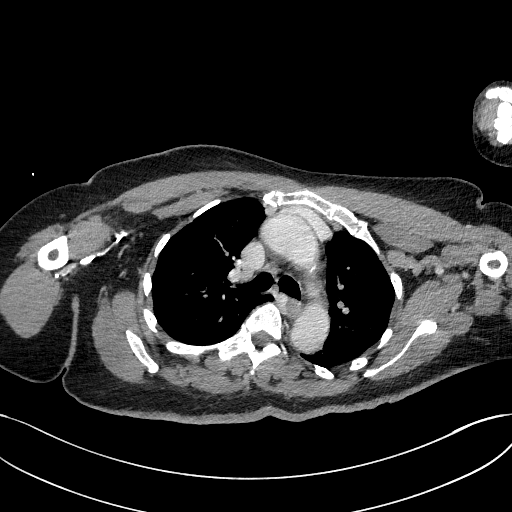
[im 678/798  lung]
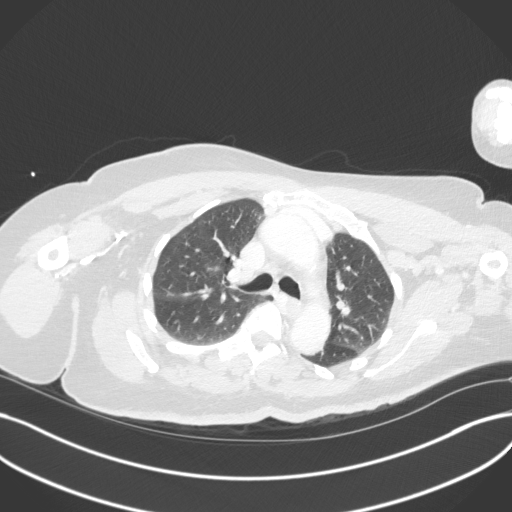
[im 758/798  lung]
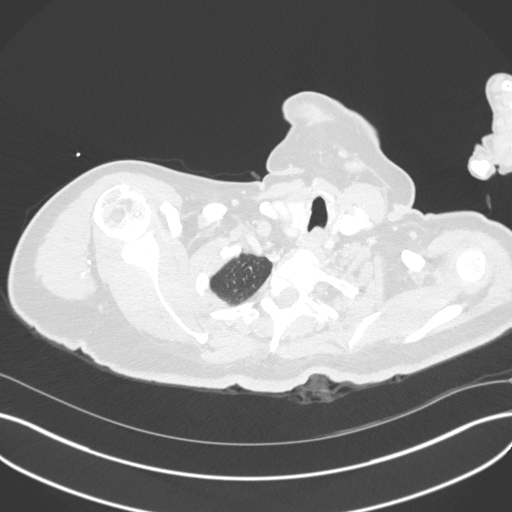

[Series 507: coronals · coronal · 0.82mm/px · 3 of 141 slices shown]
[im 29/141  lung]
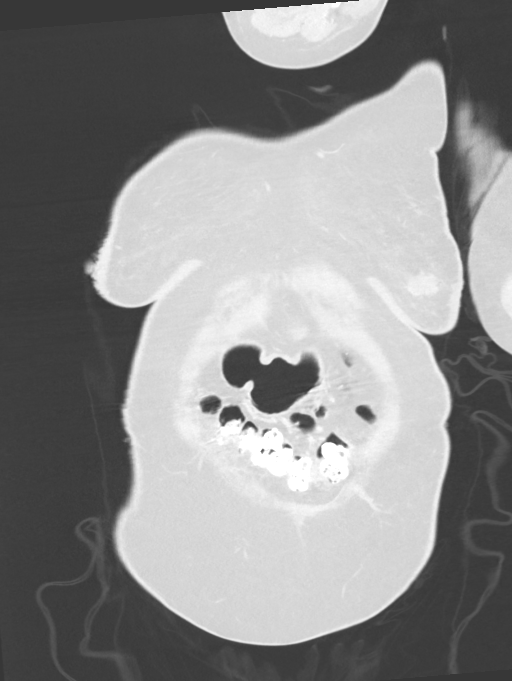
[im 57/141  lung]
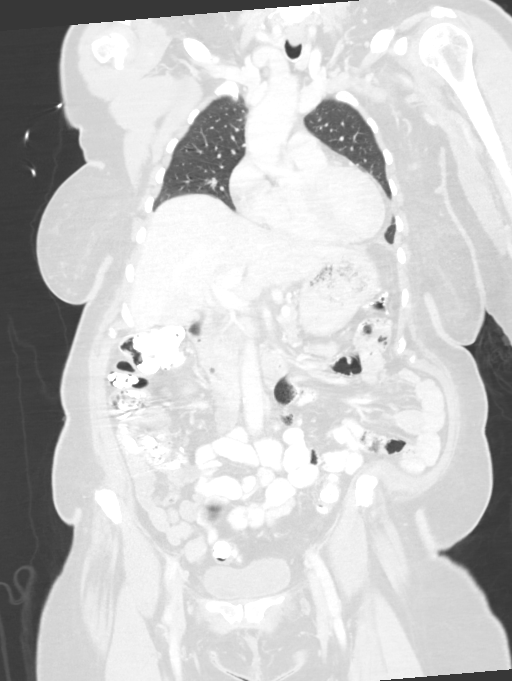
[im 85/141  lung]
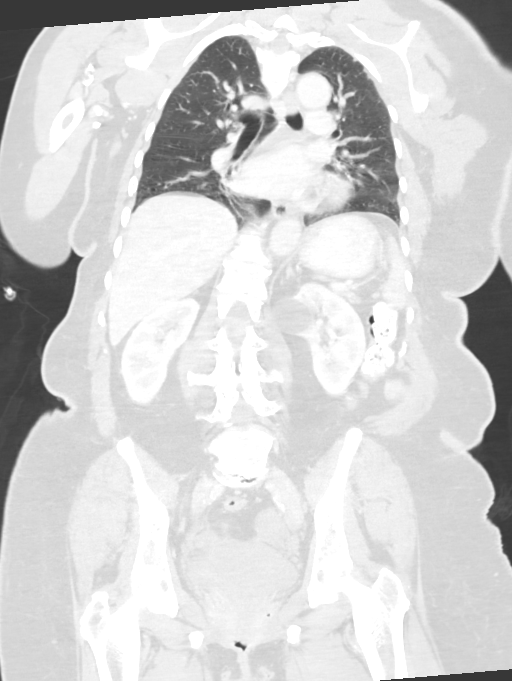

[13 of 36 positions shown; findings below may reference images not displayed]

RADIATION DOSE REDUCTION: This exam was performed according to the
departmental dose-optimization program which includes automated
exposure control, adjustment of the mA and/or kV according to
patient size and/or use of iterative reconstruction technique.

CONTRAST:  100 cc OMNIPAQUE IOHEXOL 300 MG/ML  SOLN
FINDINGS: CT CHEST FINDINGS

Cardiovascular: Top normal heart size. No significant pericardial
effusion/thickening. Atherosclerotic nonaneurysmal thoracic aorta.
Normal caliber pulmonary arteries. No central pulmonary emboli.

Mediastinum/Nodes: No discrete thyroid nodules. Unremarkable
esophagus. A few enlarged left axillary lymph nodes, largest 1.3 cm
short axis diameter (series 2/image 30). No pathologically enlarged
right axillary nodes. No mediastinal or hilar adenopathy.

Lungs/Pleura: No pneumothorax. No pleural effusion. No acute
consolidative airspace disease, lung masses or significant pulmonary
nodules.

Musculoskeletal: No aggressive appearing focal osseous lesions. Mild
thoracic spondylosis. Solid 2.8 x 2.7 cm left breast mass (series
2/image 49). Solid 1.3 x 1.1 cm right breast mass (series 2/image
45) with tiny internal calcification.

CT ABDOMEN PELVIS FINDINGS

Hepatobiliary: Normal liver with no liver mass. Layering hyperdense
material in the gallbladder favoring sludge with no discrete
radiopaque gallstones. Phrygian cap versus focal adenomyomatosis in
the fundal gallbladder wall. No definite gallbladder wall
thickening. No pericholecystic fluid. No biliary ductal dilatation.

Pancreas: Normal, with no mass or duct dilation.

Spleen: Normal size spleen. Hypodense 2.4 cm splenic mass (series
2/image 42). No additional splenic lesions.

Adrenals/Urinary Tract: Right adrenal 1.1 cm nodule with density 69
HU to. No discrete left adrenal lesions. No hydronephrosis. Simple
bilateral renal cysts, largest 3.7 cm in the medial upper left
kidney, for which no follow-up is recommended. Normal bladder.

Stomach/Bowel: Normal non-distended stomach. Normal caliber small
bowel with no small bowel wall thickening. Normal appendix. Oral
contrast transits to the rectum. Normal large bowel with no
diverticulosis, large bowel wall thickening or pericolonic fat
stranding.

Vascular/Lymphatic: Atherosclerotic nonaneurysmal abdominal aorta.
Patent portal, splenic, hepatic and renal veins. No pathologically
enlarged lymph nodes in the abdomen or pelvis.

Reproductive: Hysterectomy. Solid 5.2 x 2.8 cm left adnexal mass
(series 2/image 85). No right adnexal mass.

Other: No pneumoperitoneum, ascites or focal fluid collection.

Musculoskeletal: No aggressive appearing focal osseous lesions.
Marked degenerative disc disease at L5-S1.
IMPRESSION: 1. Bilateral breast masses, measuring 2.8 cm on the left and 1.3 cm
on the right.
2. Left axillary lymphadenopathy, compatible with nodal metastases.
3. Right adrenal 1.1 cm nodule, indeterminate, cannot exclude
adrenal metastasis.
4. Hypodense 2.4 cm splenic mass, nonspecific, cannot exclude
splenic metastasis.
5. Solid 5.2 cm left adnexal mass, suspicious for left ovarian
neoplasm or metastasis. Pelvic ultrasound correlation may be
considered.
6. Aortic Atherosclerosis ([ZV]-[ZV]).

## 2022-04-18 MED ORDER — SODIUM CHLORIDE (PF) 0.9 % IJ SOLN
INTRAMUSCULAR | Status: AC
  Filled 2022-04-18: qty 50

## 2022-04-18 MED ORDER — IOHEXOL 300 MG/ML  SOLN: 100.0000 mL | Freq: Once | INTRAMUSCULAR | Status: DC | PRN

## 2022-04-19 ENCOUNTER — Telehealth: Payer: Self-pay

## 2022-04-19 ENCOUNTER — Ambulatory Visit (HOSPITAL_COMMUNITY)
Admission: RE | Admit: 2022-04-19 | Discharge: 2022-04-19 | Disposition: A | Discharge status: Home / Self Care | Payer: Medicare Other | Source: Ambulatory Visit | Attending: Hematology and Oncology | Admitting: Hematology and Oncology

## 2022-04-19 ENCOUNTER — Encounter: Payer: Self-pay | Admitting: General Practice

## 2022-04-19 ENCOUNTER — Encounter (HOSPITAL_COMMUNITY): Payer: Medicare Other

## 2022-04-19 ENCOUNTER — Encounter (HOSPITAL_COMMUNITY): Payer: Self-pay

## 2022-04-19 ENCOUNTER — Encounter (HOSPITAL_COMMUNITY): Admission: RE | Admit: 2022-04-19 | Payer: Medicare Other | Source: Ambulatory Visit

## 2022-04-19 DIAGNOSIS — M170 Bilateral primary osteoarthritis of knee: Secondary | ICD-10-CM | POA: Diagnosis not present

## 2022-04-19 DIAGNOSIS — C50511 Malignant neoplasm of lower-outer quadrant of right female breast: Secondary | ICD-10-CM | POA: Diagnosis not present

## 2022-04-19 DIAGNOSIS — R911 Solitary pulmonary nodule: Secondary | ICD-10-CM | POA: Diagnosis not present

## 2022-04-19 DIAGNOSIS — Z170 Estrogen receptor positive status [ER+]: Secondary | ICD-10-CM | POA: Diagnosis not present

## 2022-04-19 DIAGNOSIS — M19012 Primary osteoarthritis, left shoulder: Secondary | ICD-10-CM | POA: Diagnosis not present

## 2022-04-19 DIAGNOSIS — M19011 Primary osteoarthritis, right shoulder: Secondary | ICD-10-CM | POA: Diagnosis not present

## 2022-04-19 DIAGNOSIS — C50212 Malignant neoplasm of upper-inner quadrant of left female breast: Secondary | ICD-10-CM | POA: Insufficient documentation

## 2022-04-19 DIAGNOSIS — C50919 Malignant neoplasm of unspecified site of unspecified female breast: Secondary | ICD-10-CM | POA: Diagnosis not present

## 2022-04-19 DIAGNOSIS — N281 Cyst of kidney, acquired: Secondary | ICD-10-CM | POA: Diagnosis not present

## 2022-04-19 IMAGING — NM NM BONE WHOLE BODY
2 series · 2 of 2 positions shown · non-contrast
Comparison: CT abdomen pelvis [DATE]

CLINICAL DATA: Invasive breast cancer.  Initial workup.

EXAM:
NUCLEAR MEDICINE WHOLE BODY BONE SCAN
TECHNIQUE: Whole body anterior and posterior images were obtained approximately
3 hours after intravenous injection of radiopharmaceutical.
RADIOPHARMACEUTICALS:  21.8 mCi [ER] MDP IV

[Series 1: whole body · 2.66mm/px · 1 of 1 slices shown (1 of 2)]
[im 1/1]
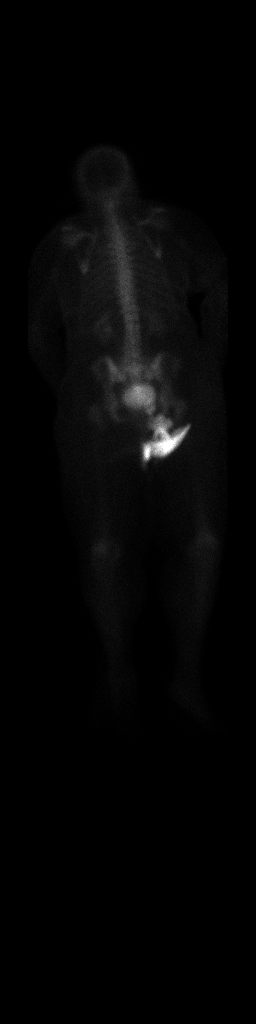

[Series 1: whole body · 2.66mm/px · 1 of 1 slices shown (2 of 2)]
[im 1/1]
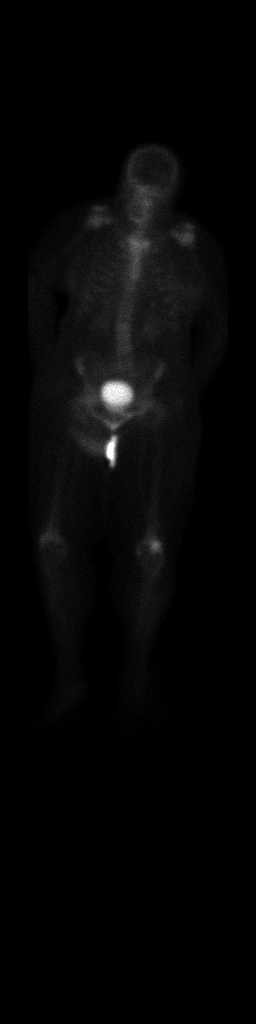

[2 of 2 positions shown; findings below may reference images not displayed]

FINDINGS: No focal abnormal increased radiotracer uptake is identified to
suggest bone metastasis. Mild degenerative uptake of bilateral
shoulders and bilateral knees are noted.
IMPRESSION: No evidence of osseous metastasis.

## 2022-04-19 MED ORDER — IOHEXOL 300 MG/ML  SOLN
100.0000 mL | Freq: Once | INTRAMUSCULAR | Status: AC | PRN
  Administered 2022-04-19: 100 mL via INTRAVENOUS

## 2022-04-19 MED ORDER — IOHEXOL 9 MG/ML PO SOLN
500.0000 mL | ORAL | Status: DC
  Administered 2022-04-19: 500 mL via ORAL

## 2022-04-19 MED ORDER — TECHNETIUM TC 99M MEDRONATE IV KIT
21.8000 | PACK | Freq: Once | INTRAVENOUS | Status: AC
  Administered 2022-04-19: 21.8 via INTRAVENOUS

## 2022-04-19 MED ORDER — IOHEXOL 9 MG/ML PO SOLN: 500.0000 mL | Freq: Once | ORAL | Status: DC

## 2022-04-19 MED ORDER — IOHEXOL 9 MG/ML PO SOLN
ORAL | Status: AC
  Filled 2022-04-19: qty 500

## 2022-04-19 NOTE — Telephone Encounter (Signed)
Spoke with pt's daughter. Pt is to arrive ASAP to drink oral contrast and have BSI before bone scan. She verbalized thanks and understanding.

## 2022-04-19 NOTE — Telephone Encounter (Signed)
Received call from CT imaging this morning, as well as pt's daughter, stating pt was not able to have CT yesterday because her IV infiltrated and there was trouble obtaining IV access to begin with. Debbra Riding, CT director asks if we are able to perform CT W/O contrast. Per Dr Chryl Heck, pt must have contrast in order to obtain accurate scans. Pt was also scheduled for bone scan today.  Spoke with pt's daughter who asks if we can place a PICC line. Advised this would be a decision for Dr Lindi Adie to make. Encouraged increased PO intake but daughter states it takes the pt a while to drink and she does not like to drink much d/t incontinence issues. Educated pt's daughter on the importance of PO intake of fluids for the health of the pt as well as integrity of veins. We discussed PICC line and port-a-cath placement and she would like to discuss these options further with Dr Lindi Adie.   At this point pt is late for injection for bone scan and is not able to attend that appt. Speaking with Debbra Riding to further accommodate bone scan and CT. Pt's daughter knows I will be back in touch with further recommendations/schedule for scans.

## 2022-04-19 NOTE — Progress Notes (Signed)
[facility] Spiritual Care Note  Sheila Perez and her daughter Sheila Perez use the same phone. Cherre Huger today for follow-up support and check-in after [facility]. She is planning to investigate whether Sheila Anglemyer could receive reiki in a chair because that would maximize her comfort during such care.  Sheila Perez is also aware of ongoing Spiritual Care availability for both patient and caregiver support, and she may reach out to schedule some time together for her mom to receive social and emotional support. 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   [place], [place], [facility] Pager [phone] Voicemail [phone]

## 2022-04-20 ENCOUNTER — Telehealth: Payer: Self-pay | Admitting: Family Medicine

## 2022-04-20 ENCOUNTER — Telehealth: Payer: Self-pay | Admitting: *Deleted

## 2022-04-20 ENCOUNTER — Encounter: Payer: Self-pay | Admitting: *Deleted

## 2022-04-20 NOTE — Telephone Encounter (Signed)
Sheila Perez (pt daughter) called asking if Dr. [name] can rewrite a order for [facility] skill nursing with [facility]. Skill nursing need a custodian care recommendation to support them with pt's physical therapy.   Daughter would like a call back once order has been placed. Call back [phone]  Please advise.

## 2022-04-20 NOTE — Telephone Encounter (Signed)
Spoke with daughter to follow up from [facility] 5/31 and assess navigation needs. Family denies any questions or concerns at this time. Confirmed telephone appt with Dr. Lindi Adie for 6/12 to discuss scans. Encouraged them to call should anything arise. Voiced understanding.

## 2022-04-21 ENCOUNTER — Encounter: Payer: Self-pay | Admitting: *Deleted

## 2022-04-21 NOTE — Telephone Encounter (Signed)
Pt daughter is calling and order need to go through [facility] and [facility] will be agency that is in network

## 2022-04-21 NOTE — Telephone Encounter (Signed)
I called and left patient's daughter a voicemail letting her know that home health aide has been added to order & it will be re-sent to patient's home health provider.

## 2022-04-21 NOTE — Telephone Encounter (Signed)
Order placed & message sent to referral coordinator to have her re-fax order.

## 2022-04-21 NOTE — Addendum Note (Signed)
Addended by: Rodrigo Ran on: 04/21/2022 07:22 AM   Modules accepted: Orders

## 2022-04-21 NOTE — Telephone Encounter (Signed)
Updated message sent to referral coordinator to get order sent to [facility].

## 2022-04-24 ENCOUNTER — Encounter: Payer: Self-pay | Admitting: Family Medicine

## 2022-04-24 ENCOUNTER — Inpatient Hospital Stay: Payer: Medicare Other | Attending: Hematology and Oncology | Admitting: Hematology and Oncology

## 2022-04-24 DIAGNOSIS — C50212 Malignant neoplasm of upper-inner quadrant of left female breast: Secondary | ICD-10-CM

## 2022-04-24 DIAGNOSIS — Z170 Estrogen receptor positive status [ER+]: Secondary | ICD-10-CM | POA: Diagnosis not present

## 2022-04-24 DIAGNOSIS — I700 Atherosclerosis of aorta: Secondary | ICD-10-CM | POA: Insufficient documentation

## 2022-04-24 MED ORDER — LETROZOLE 2.5 MG PO TABS: 2.5000 mg | ORAL_TABLET | Freq: Every day | ORAL | 3 refills | Status: DC

## 2022-04-24 NOTE — Assessment & Plan Note (Signed)
Palpable left breast mass 9 o'clock position 2.9 cm, multiple left axillary lymph nodes with skin thickening biopsy: Positive lymph node, biopsy of the breast mass: Grade 3 IDC ER 70%, PR 5%, HER2 equivocal, FISH Neg, Ki-67 30%  Right breast mass 1.3 cm with calcifications 2 cm (calcs were not biopsied), biopsy grade 3 IDC ER 100%, PR 30%, HER2 equivocal by IHC, FISH Neg, Ki-67 70%, 1 abnormal lymph node biopsy: Benign concordant   CT CAP 04/21/22: Bil Breast masses, Left Axill LN, Rt Adrenal Nodule, Left Adnexal mass 5.2 cm (suspicious for ovarian cancer)  Treatment plan: 1.  Bilateral lumpectomies versus bilateral mastectomies with targeted node dissection 2. adjuvantchemotherapy based upon Oncotype 3.  Adjuvant radiation 4.  Adjuvant antiestrogen therapy 5.  Genetics  Gyn Consult reg Ovarian mass   consider sending Oncotype Dx We may also need to consider doing a breast MRI for further evaluation.

## 2022-04-24 NOTE — Progress Notes (Signed)
PHONE VISIT     Patient Care Team: [name], [name], MD as PCP - General (Family Medicine) Pieter Partridge, DO as Consulting Physician (Neurology) [name], RN as Case Manager Saporito, Maree Erie, LCSW as Social Worker (Licensed Clinical Social Worker) [name], [name], MD (Family Medicine) Mauro Kaufmann, RN as Oncology Nurse Navigator Rockwell Germany, RN as Oncology Nurse Navigator Erroll Luna, MD as Consulting Physician (General Surgery) Nicholas Lose, MD as Consulting Physician (Hematology and Oncology) Eppie Gibson, MD as Attending Physician (Radiation Oncology)  DIAGNOSIS:  Encounter Diagnosis  Name Primary?   Malignant neoplasm of upper-inner quadrant of left breast in female, estrogen receptor positive ([place])     SUMMARY OF ONCOLOGIC HISTORY: Oncology History  Malignant neoplasm of lower-outer quadrant of right breast of female, estrogen receptor positive ([place])  04/04/2022 Initial Diagnosis   Right breast mass 1.3 cm with calcifications 2 cm (calcs were not biopsied), biopsy grade 3 IDC ER 100%, PR 30%, HER2 equivocal by IHC, FISH pending, Ki-67 70%, 1 abnormal lymph node biopsy: Benign concordant   04/12/2022 Cancer Staging   Staging form: Breast, AJCC 8th Edition - Clinical: Stage IA (cT1c, cN0, cM0, G3, ER+, PR+, HER2: Equivocal) - Signed by Nicholas Lose, MD on 04/12/2022 Stage prefix: Initial diagnosis Histologic grading system: 3 grade system   Malignant neoplasm of upper-inner quadrant of left breast in female, estrogen receptor positive ([place])  04/04/2022 Initial Diagnosis   Palpable left breast mass 9 o'clock position 2.9 cm, multiple left axillary lymph nodes with skin thickening biopsy: Positive lymph node, biopsy of the breast mass: Grade 3 IDC ER 70%, PR 5%, HER2 equivocal, FISH pending, Ki-67 30%   04/12/2022 Cancer Staging   Staging form: Breast, AJCC 8th Edition - Clinical: Stage IIB (cT2, cN1, cM0, G3, ER+, PR+, HER2: Equivocal) - Signed by  Nicholas Lose, MD on 04/12/2022 Stage prefix: Initial diagnosis Histologic grading system: 3 grade system     CHIEF COMPLIANT: F/U to discuss scans  INTERVAL HISTORY: Sheila Perez is a patient with Bil breast cancers whose daughter connected with me by phone to discuss her scans. She has poor PS with Bil leg swelling and weakness   ALLERGIES:  has No Known Allergies.  MEDICATIONS:  Current Outpatient Medications  Medication Sig Dispense Refill   acetaminophen (TYLENOL) 500 MG tablet Take 500 mg by mouth every 6 (six) hours as needed for mild pain.     amLODipine (NORVASC) 10 MG tablet Take 1 tablet (10 mg total) by mouth daily. 30 tablet 0   atorvastatin (LIPITOR) 40 MG tablet Take 1 tablet (40 mg total) by mouth daily. 90 tablet 3   cholecalciferol (VITAMIN D3) 25 MCG (1000 UNIT) tablet Take 1,000 Units by mouth daily.     co-enzyme Q-10 30 MG capsule Take 30 mg by mouth daily.     omega-3 acid ethyl esters (LOVAZA) 1 g capsule Take 1 g by mouth 2 (two) times daily.     No current facility-administered medications for this visit.    PHYSICAL EXAMINATION: ECOG PERFORMANCE STATUS: 2 - Symptomatic, <50% confined to bed   LABORATORY DATA:  I have reviewed the data as listed    Latest Ref Rng & Units 04/12/2022   12:36 PM 03/17/2022    4:30 PM 08/27/2021   11:26 AM  CMP  Glucose 70 - 99 mg/dL 105  81  94   BUN 8 - 23 mg/dL $Remove'11  19  16   'NnESyPH$ Creatinine 0.44 - 1.00  mg/dL 0.92  1.12  0.93   Sodium 135 - 145 mmol/L 140  142  137   Potassium 3.5 - 5.1 mmol/L 3.8  3.8  4.0   Chloride 98 - 111 mmol/L 104  105  105   CO2 22 - 32 mmol/L $RemoveB'28  25  24   'PnNSNNKt$ Calcium 8.9 - 10.3 mg/dL 9.7  9.6  8.7   Total Protein 6.5 - 8.1 g/dL 7.3   6.6   Total Bilirubin 0.3 - 1.2 mg/dL 1.0   0.7   Alkaline Phos 38 - 126 U/L 66   54   AST 15 - 41 U/L 12   15   ALT 0 - 44 U/L 7   12     Lab Results  Component Value Date   WBC 5.6 04/12/2022   HGB 13.1 04/12/2022   HCT 38.4 04/12/2022   MCV 90.8  04/12/2022   PLT 227 04/12/2022   NEUTROABS 3.1 04/12/2022    ASSESSMENT & PLAN:  Malignant neoplasm of upper-inner quadrant of left breast in female, estrogen receptor positive (H[facility]) Palpable left breast mass 9 o'clock position 2.9 cm, multiple left axillary lymph nodes with skin thickening biopsy: Positive lymph node, biopsy of the breast mass: Grade 3 IDC ER 70%, PR 5%, HER2 equivocal, FISH Neg, Ki-67 30%  Right breast mass 1.3 cm with calcifications 2 cm (calcs were not biopsied), biopsy grade 3 IDC ER 100%, PR 30%, HER2 equivocal by IHC, FISH Neg, Ki-67 70%, 1 abnormal lymph node biopsy: Benign concordant   CT CAP 04/21/22: Bil Breast masses, Left Axill LN, Rt Adrenal Nodule, Left Adnexal mass 5.2 cm (suspicious for ovarian cancer)  Treatment plan: 1.  Surgery: not being planned because of her health issues and possibility that she may have metastatic disease 2. Anti estrogen therapy: Letrozole   Goal of treatment: Palliation Gyn Consult reg Ovarian mass. F/U in 1 month Plan is to get another CT in 3 months  No orders of the defined types were placed in this encounter.  The patient has a good understanding of the overall plan. she agrees with it. she will call with any problems that may develop before the next visit here. Total time spent: 30 mins including face to face time and time spent for planning, charting and co-ordination of care   Harriette Ohara, MD 04/24/22  Total time spent 15 mins

## 2022-04-25 ENCOUNTER — Ambulatory Visit (HOSPITAL_COMMUNITY): Payer: Medicare Other

## 2022-04-25 ENCOUNTER — Other Ambulatory Visit (HOSPITAL_COMMUNITY): Payer: Medicare Other

## 2022-04-25 ENCOUNTER — Encounter: Payer: Self-pay | Admitting: *Deleted

## 2022-04-25 DIAGNOSIS — N838 Other noninflammatory disorders of ovary, fallopian tube and broad ligament: Secondary | ICD-10-CM

## 2022-04-27 ENCOUNTER — Telehealth: Payer: Self-pay | Admitting: *Deleted

## 2022-04-27 NOTE — Telephone Encounter (Signed)
Attempted to reach the patient to schedule a new patient appt with Dr Berline Lopes; [facility] to call the office back. Patient needs an appt for the week on July 3rd

## 2022-04-28 ENCOUNTER — Telehealth: Payer: Self-pay | Admitting: Genetic Counselor

## 2022-04-28 ENCOUNTER — Encounter: Payer: Self-pay | Admitting: Genetic Counselor

## 2022-04-28 DIAGNOSIS — Z1379 Encounter for other screening for genetic and chromosomal anomalies: Secondary | ICD-10-CM | POA: Insufficient documentation

## 2022-04-28 NOTE — Telephone Encounter (Signed)
I contacted Ms. Hebner to discuss her genetic testing results. No pathogenic variants were identified in the 36 genes analyzed. 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 Detailed clinic note to follow.  The test report has been scanned into EPIC and is located under the Molecular Pathology section of the Results Review tab.  A portion of the result report is included below for reference.   Lucille Passy, MS, [facility] Genetic Counselor [email or web address] (P) [phone]

## 2022-05-01 NOTE — Telephone Encounter (Addendum)
Pt Daughter is calling checking on the referral  and please call pt daughter

## 2022-05-01 NOTE — Telephone Encounter (Signed)
Awaiting answer back from [facility] to see if they accept. Will update as soon as I get a response.  Thank you! Paula Libra

## 2022-05-02 ENCOUNTER — Ambulatory Visit (INDEPENDENT_AMBULATORY_CARE_PROVIDER_SITE_OTHER): Payer: Medicare Other

## 2022-05-02 DIAGNOSIS — E785 Hyperlipidemia, unspecified: Secondary | ICD-10-CM

## 2022-05-02 DIAGNOSIS — N63 Unspecified lump in unspecified breast: Secondary | ICD-10-CM

## 2022-05-02 DIAGNOSIS — I10 Essential (primary) hypertension: Secondary | ICD-10-CM

## 2022-05-02 DIAGNOSIS — I639 Cerebral infarction, unspecified: Secondary | ICD-10-CM

## 2022-05-02 DIAGNOSIS — I680 Cerebral amyloid angiopathy: Secondary | ICD-10-CM

## 2022-05-02 NOTE — Chronic Care Management (AMB) (Cosign Needed)
Chronic Care Management   CCM RN Visit Note  05/02/2022 Name: Sheila Perez MRN: [id number] DOB: June 22, 1946  Subjective: [id number] Sheila Perez is a 76 y.o. year old female who is a primary care patient of [name], [name], MD. The care management team was consulted for assistance with disease management and care coordination needs.    Engaged with patient by telephone for follow up visit in response to provider referral for case management and/or care coordination services.   Consent to Services:  The patient was given information about Chronic Care Management services, agreed to services, and gave verbal consent prior to initiation of services.  Please see initial visit note for detailed documentation.   Patient agreed to services and verbal consent obtained.   Assessment: Review of patient past medical history, allergies, medications, health status, including review of consultants reports, laboratory and other test data, was performed as part of comprehensive evaluation and provision of chronic care management services.   SDOH (Social Determinants of Health) assessments and interventions performed:    CCM Care Plan  No Known Allergies  Outpatient Encounter Medications as of 05/02/2022  Medication Sig   acetaminophen (TYLENOL) 500 MG tablet Take 500 mg by mouth every 6 (six) hours as needed for mild pain.   amLODipine (NORVASC) 10 MG tablet Take 1 tablet (10 mg total) by mouth daily.   atorvastatin (LIPITOR) 40 MG tablet Take 1 tablet (40 mg total) by mouth daily.   cholecalciferol (VITAMIN D3) 25 MCG (1000 UNIT) tablet Take 1,000 Units by mouth daily.   co-enzyme Q-10 30 MG capsule Take 30 mg by mouth daily.   letrozole (FEMARA) 2.5 MG tablet Take 1 tablet (2.5 mg total) by mouth daily.   omega-3 acid ethyl esters (LOVAZA) 1 g capsule Take 1 g by mouth 2 (two) times daily.   No facility-administered encounter medications on file as of 05/02/2022.    Patient Active  Problem List   Diagnosis Date Noted   Genetic testing 04/28/2022   Aortic atherosclerosis ([place]) 04/24/2022   Family history of breast cancer 04/13/2022   Family history of ovarian cancer 04/13/2022   Malignant neoplasm of lower-outer quadrant of right breast of female, estrogen receptor positive ([place]) 04/11/2022   Malignant neoplasm of upper-inner quadrant of left breast in female, estrogen receptor positive ([place]) 04/11/2022   Limited mobility 01/09/2022   Vitamin D deficiency, unspecified 04/18/2021   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CODE) 04/18/2021   Morbid obesity ([place]) 05/19/2020   PAD (peripheral artery disease) ([place]) 05/07/2020   Urinary incontinence in female 02/27/2020   Hyperlipidemia 02/18/2020   Prediabetes 02/18/2020   CVA (cerebral vascular accident) ([place]) 02/18/2020   Hypertension, essential, benign 01/30/2020    Conditions to be addressed/monitored:HTN, HLD, Dementia, and hx CVA, breast CA  Care Plan : RN Care Manager Plan of Care  Updates made by [name], RN since 05/02/2022 12:00 AM     Problem: Chronic Disease Management and Care Coordination Needs (HTN, hx CVA, memory issues and HLD)   Priority: High     Long-Range Goal: Establish Plan of Care for Chronic Disease Management Needs (HTN, hx CVA, memory issues and HLD)   Start Date: 11/08/2021  Expected End Date: 05/07/2022  Priority: High  Note:   Current Barriers:  Care Coordination needs related to Level of care concerns, ADL IADL limitations, Memory Deficits, Inability to perform ADL's independently, and Inability to perform IADL's independently Chronic Disease Management support and education needs related to HTN, HLD, Dementia, and hx  CVA  Spoke with daughter [name]. States pt has a new dx of breast CA in both breasts which has spread to her lymph nodes.  States she has a mass on her ovary that they will go to see the GYN oncologist on 05/19/22.  States she is not a  candidate for surgery, chemo or radiation for her breast CA.  States she is now taking a pill and getting injections every 3 week.  Daughter states she is trying to give her a plant based diet with more antioxidants.  States she is trying to get her to move more and she is walking to the door and sitting outside some.  States her cognition comes and goes. States that pt's B/P has been up and down and last reading was 160/90.   States she is giving the pt her medications.    Denies any recent falls. States she is using her Rolator but also uses furniture to steady herself.   States they are still working on getting more home help with pt's insurance.  States she never heard back about pt's referral for home health for PT/OT which help pt get custodial care.  RNCM Clinical Goal(s):  Patient will verbalize understanding of plan for management of HTN, HLD, Dementia, and hx CVA  as evidenced by voiced adherence to plan of care verbalize basic understanding of  HTN, HLD, Dementia, and hx CVA  disease process and self health management plan as evidenced by voiced understanding and teach back take all medications exactly as prescribed and will call provider for medication related questions as evidenced by dispense report and pt verbalization attend all scheduled medical appointments: GYN 05/19/22 as evidenced by medical records demonstrate Improved adherence to prescribed treatment plan for HTN, HLD, Dementia, and hx CVA  as evidenced by readings within limits, voiced adherence to plan of care continue to work with RN Care Manager to address care management and care coordination needs related to  HTN, HLD, Dementia, and hx CVA  as evidenced by adherence to CM Team Scheduled appointments through collaboration with RN Care manager, provider, and care team.   Interventions: 1:1 collaboration with primary care provider regarding development and update of comprehensive plan of care as evidenced by provider attestation  and co-signature Inter-disciplinary care team collaboration (see longitudinal plan of care) Evaluation of current treatment plan related to  self management and patient's adherence to plan as established by provider Communicated to provider to resend referral for home health    Oncology:  (Status: New goal.) Long Term Goal Assessment of understanding of oncology diagnosis:  Assessed patient understanding of cancer diagnosis and recommended treatment plan, Reviewed upcoming provider appointments and treatment appointments, Assessed available transportation to appointments and treatments. Has consistent/reliable transportation: Yes, and Assessed support system. Has consistent/reliable family or other support: Yes  Hyperlipidemia Interventions:  (Status:  Goal on track:  Yes.) Long Term Goal Medication review performed; medication list updated in electronic medical record.  Counseled on importance of regular laboratory monitoring as prescribed Reviewed role and benefits of statin for ASCVD risk reduction Reviewed importance of limiting foods high in cholesterol Reinforced to try to eat more fiber  Hypertension Interventions:  (Status:  Goal on track:  NO.) Long Term Goal Last practice recorded BP readings:  BP Readings from Last 3 Encounters:  04/12/22 (!) 189/94  03/17/22 (!) 160/90  12/07/21 (!) 182/95  Most recent eGFR/CrCl: No results found for: EGFR  No components found for: CRCL  Evaluation  of current treatment plan related to hypertension self management and patient's adherence to plan as established by provider Provided education to patient re: stroke prevention, s/s of heart attack and stroke Reviewed medications with patient and discussed importance of compliance Discussed plans with patient for ongoing care management follow up and provided patient with direct contact information for care management team Advised patient, providing education and rationale, to monitor blood pressure  daily and record, calling PCP for findings outside established parameters Provided education on prescribed diet low sodium Reinforced to call provider for high B/P readings. Reviewed to encouraged more activity  Dementia:  (Status:  Goal on track:  Yes.)  Long Term Goal Evaluation of current treatment plan related to misuse of: Vascular/Multi-infarct dementia, mild Consideration of in-home help encouraged , Advised to contact provider for new or worsening symptoms, and Reinforced to continue to work on getting assistance in home from new insurance. Communicated to provider to resend home health referral   Stroke:  (Status:Goal on track:  Yes.) Long Term Goal Reviewed Importance of taking all medications as prescribed Reviewed Importance of attending all scheduled provider appointments Advised to report any changes in symptoms or exercise tolerance Assessed for signs and symptoms of stroke Assessed for management of bladder and/or bowel incontinence Reviewed the importance of exercise Reinforced fall prevention and home safety. Reinforced to use walker at all times  Patient Goals/Self-Care Activities: Take all medications as prescribed Attend all scheduled provider appointments Call pharmacy for medication refills 3-7 days in advance of running out of medications Call provider office for new concerns or questions  check blood pressure daily choose a place to take my blood pressure (home, clinic or office, retail store) take blood pressure log to all doctor appointments call doctor for signs and symptoms of high blood pressure keep all doctor appointments take medications for blood pressure exactly as prescribed eat more whole grains, fruits and vegetables, lean meats and healthy fats limit salt intake to 2019m/day call for medicine refill 2 or 3 days before it runs out take all medications exactly as prescribed call doctor with any symptoms you believe are related to your  medicine  Follow Up Plan:  Telephone follow up appointment with care management team member scheduled for:  06/05/22 The patient has been provided with contact information for the care management team and has been advised to call with any health related questions or concerns.       Plan:Telephone follow up appointment with care management team member scheduled for:  06/05/22 The patient has been provided with contact information for the care management team and has been advised to call with any health related questions or concerns.  MPeter GarterRN, BJackquline Denmark CDE Care Management Coordinator [facility] [phone]

## 2022-05-02 NOTE — Patient Instructions (Signed)
Visit Information  Thank you for taking time to visit with me today. Please don't hesitate to contact me if I can be of assistance to you before our next scheduled telephone appointment.  Following are the goals we discussed today:  Take all medications as prescribed Attend all scheduled provider appointments Call pharmacy for medication refills 3-7 days in advance of running out of medications Call provider office for new concerns or questions  check blood pressure daily choose a place to take my blood pressure (home, clinic or office, retail store) take blood pressure log to all doctor appointments call doctor for signs and symptoms of high blood pressure keep all doctor appointments take medications for blood pressure exactly as prescribed eat more whole grains, fruits and vegetables, lean meats and healthy fats limit salt intake to '2000mg'$ /day call for medicine refill 2 or 3 days before it runs out take all medications exactly as prescribed call doctor with any symptoms you believe are related to your medicine  Our next appointment is by telephone on 06/05/22 at 11:30 AM  Please call the care guide team at [phone] if you need to cancel or reschedule your appointment.   If you are experiencing a Mental Health or [place] or need someone to talk to, please call the Suicide and Crisis Lifeline: 988 call the [place] National Suicide Prevention Lifeline: 585 250 7894 or TTY: 979-415-0349 TTY (802)067-5906) to talk to a trained counselor call 1-800-273-TALK (toll free, 24 hour hotline) go to [facility] [address], [place] [phone]) call 911   The patient verbalized understanding of instructions, educational materials, and care plan provided today and agreed to receive a mailed copy of patient instructions, educational materials, and care plan.   Peter Garter RN, Jackquline Denmark, CDE Care Management Coordinator [facility] [phone]

## 2022-05-03 ENCOUNTER — Ambulatory Visit: Payer: Self-pay

## 2022-05-03 DIAGNOSIS — I680 Cerebral amyloid angiopathy: Secondary | ICD-10-CM

## 2022-05-03 DIAGNOSIS — I10 Essential (primary) hypertension: Secondary | ICD-10-CM

## 2022-05-03 NOTE — Addendum Note (Signed)
Addended by: Rodrigo Ran on: 05/03/2022 07:48 AM   Modules accepted: Orders

## 2022-05-03 NOTE — Chronic Care Management (AMB) (Cosign Needed)
Chronic Care Management   CCM RN Visit Note  05/03/2022 Name: Sheila Perez MRN: [id number] DOB: Dec 05, 1945  Subjective: [id number] Sheila Perez is a 76 y.o. year old female who is a primary care patient of [name], [name], MD. The care management team was consulted for assistance with disease management and care coordination needs.    Engaged with patient by telephone for follow up visit in response to provider referral for case management and/or care coordination services.   Consent to Services:  The patient was given information about Chronic Care Management services, agreed to services, and gave verbal consent prior to initiation of services.  Please see initial visit note for detailed documentation.   Patient agreed to services and verbal consent obtained.   Assessment: Review of patient past medical history, allergies, medications, health status, including review of consultants reports, laboratory and other test data, was performed as part of comprehensive evaluation and provision of chronic care management services.   SDOH (Social Determinants of Health) assessments and interventions performed:    CCM Care Plan  No Known Allergies  Outpatient Encounter Medications as of 05/03/2022  Medication Sig   acetaminophen (TYLENOL) 500 MG tablet Take 500 mg by mouth every 6 (six) hours as needed for mild pain.   amLODipine (NORVASC) 10 MG tablet Take 1 tablet (10 mg total) by mouth daily.   atorvastatin (LIPITOR) 40 MG tablet Take 1 tablet (40 mg total) by mouth daily.   cholecalciferol (VITAMIN D3) 25 MCG (1000 UNIT) tablet Take 1,000 Units by mouth daily.   co-enzyme Q-10 30 MG capsule Take 30 mg by mouth daily.   letrozole (FEMARA) 2.5 MG tablet Take 1 tablet (2.5 mg total) by mouth daily.   omega-3 acid ethyl esters (LOVAZA) 1 g capsule Take 1 g by mouth 2 (two) times daily.   No facility-administered encounter medications on file as of 05/03/2022.    Patient Active  Problem List   Diagnosis Date Noted   Genetic testing 04/28/2022   Aortic atherosclerosis ([place]) 04/24/2022   Family history of breast cancer 04/13/2022   Family history of ovarian cancer 04/13/2022   Malignant neoplasm of lower-outer quadrant of right breast of female, estrogen receptor positive ([place]) 04/11/2022   Malignant neoplasm of upper-inner quadrant of left breast in female, estrogen receptor positive ([place]) 04/11/2022   Limited mobility 01/09/2022   Vitamin D deficiency, unspecified 04/18/2021   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CODE) 04/18/2021   Morbid obesity ([place]) 05/19/2020   PAD (peripheral artery disease) ([place]) 05/07/2020   Urinary incontinence in female 02/27/2020   Hyperlipidemia 02/18/2020   Prediabetes 02/18/2020   CVA (cerebral vascular accident) ([place]) 02/18/2020   Hypertension, essential, benign 01/30/2020    Conditions to be addressed/monitored:HTN and Dementia  Care Plan : RN Care Manager Plan of Care  Updates made by [name], RN since 05/03/2022 12:00 AM  Completed 05/03/2022   Problem: Chronic Disease Management and Care Coordination Needs (HTN, hx CVA, memory issues and HLD) Resolved 05/03/2022  Priority: High     Long-Range Goal: Establish Plan of Care for Chronic Disease Management Needs (HTN, hx CVA, memory issues and HLD) Completed 05/03/2022  Start Date: 11/08/2021  Expected End Date: 05/07/2022  Priority: High  Note:   Case closed goals met Current Barriers:  Care Coordination needs related to Level of care concerns, ADL IADL limitations, Memory Deficits, Inability to perform ADL's independently, and Inability to perform IADL's independently Chronic Disease Management support and education needs related to HTN, HLD, Dementia,  and hx CVA  Spoke with daughter Systems analyst. States pt has a new dx of breast CA in both breasts which has spread to her lymph nodes.  States she has a mass on her ovary that they will go to see  the GYN oncologist on 05/19/22.  States she is not a candidate for surgery, chemo or radiation for her breast CA.  States she is now taking a pill and getting injections every 3 week.  Daughter states she is trying to give her a plant based diet with more antioxidants.  States she is trying to get her to move more and she is walking to the door and sitting outside some.  States her cognition comes and goes. States that pt's B/P has been up and down and last reading was 160/90.   States she is giving the pt her medications.    Denies any recent falls. States she is using her Rolator but also uses furniture to steady herself.   States they are still working on getting more home help with pt's insurance.  States she never heard back about pt's referral for home health for PT/OT which help pt get custodial care.  RNCM Clinical Goal(s):  Patient will verbalize understanding of plan for management of HTN, HLD, Dementia, and hx CVA  as evidenced by voiced adherence to plan of care verbalize basic understanding of  HTN, HLD, Dementia, and hx CVA  disease process and self health management plan as evidenced by voiced understanding and teach back take all medications exactly as prescribed and will call provider for medication related questions as evidenced by dispense report and pt verbalization attend all scheduled medical appointments: GYN 05/19/22 as evidenced by medical records demonstrate Improved adherence to prescribed treatment plan for HTN, HLD, Dementia, and hx CVA  as evidenced by readings within limits, voiced adherence to plan of care continue to work with RN Care Manager to address care management and care coordination needs related to  HTN, HLD, Dementia, and hx CVA  as evidenced by adherence to CM Team Scheduled appointments through collaboration with RN Care manager, provider, and care team.   Interventions: 1:1 collaboration with primary care provider regarding development and update of comprehensive  plan of care as evidenced by provider attestation and co-signature Inter-disciplinary care team collaboration (see longitudinal plan of care) Evaluation of current treatment plan related to  self management and patient's adherence to plan as established by provider Communicated to provider to resend referral for home health    Oncology:  (Status: Goal Met.) Long Term Goal Assessment of understanding of oncology diagnosis:  Assessed patient understanding of cancer diagnosis and recommended treatment plan, Reviewed upcoming provider appointments and treatment appointments, Assessed available transportation to appointments and treatments. Has consistent/reliable transportation: Yes, and Assessed support system. Has consistent/reliable family or other support: Yes  Hyperlipidemia Interventions:  (Status:  Goal Met.) Long Term Goal Medication review performed; medication list updated in electronic medical record.  Counseled on importance of regular laboratory monitoring as prescribed Reviewed role and benefits of statin for ASCVD risk reduction Reviewed importance of limiting foods high in cholesterol Reinforced to try to eat more fiber  Hypertension Interventions:  (Status:  Gaol Not Met.) Long Term Goal Last practice recorded BP readings:  BP Readings from Last 3 Encounters:  04/12/22 (!) 189/94  03/17/22 (!) 160/90  12/07/21 (!) 182/95  Most recent eGFR/CrCl: No results found for: EGFR  No components found for: CRCL  Evaluation of current treatment  plan related to hypertension self management and patient's adherence to plan as established by provider Provided education to patient re: stroke prevention, s/s of heart attack and stroke Reviewed medications with patient and discussed importance of compliance Discussed plans with patient for ongoing care management follow up and provided patient with direct contact information for care management team Advised patient, providing education and  rationale, to monitor blood pressure daily and record, calling PCP for findings outside established parameters Provided education on prescribed diet low sodium Reinforced to call provider for high B/P readings. Reviewed to encouraged more activity  Dementia:  (Status:  Goal Met.)  Long Term Goal Evaluation of current treatment plan related to misuse of: Vascular/Multi-infarct dementia, mild Consideration of in-home help encouraged , Advised to contact provider for new or worsening symptoms, and Reinforced to continue to work on getting assistance in home from new insurance. Communicated to provider to resend home health referral   Stroke:  (Status:Goal Met.) Long Term Goal Reviewed Importance of taking all medications as prescribed Reviewed Importance of attending all scheduled provider appointments Advised to report any changes in symptoms or exercise tolerance Assessed for signs and symptoms of stroke Assessed for management of bladder and/or bowel incontinence Reviewed the importance of exercise Reinforced fall prevention and home safety. Reinforced to use walker at all times  Patient Goals/Self-Care Activities: Take all medications as prescribed Attend all scheduled provider appointments Call pharmacy for medication refills 3-7 days in advance of running out of medications Call provider office for new concerns or questions  check blood pressure daily choose a place to take my blood pressure (home, clinic or office, retail store) take blood pressure log to all doctor appointments call doctor for signs and symptoms of high blood pressure keep all doctor appointments take medications for blood pressure exactly as prescribed eat more whole grains, fruits and vegetables, lean meats and healthy fats limit salt intake to 205m/day call for medicine refill 2 or 3 days before it runs out take all medications exactly as prescribed call doctor with any symptoms you believe are related to  your medicine  Follow Up Plan:  The patient has been provided with contact information for the care management team and has been advised to call with any health related questions or concerns.  No further follow up required: Case closed goals met       Plan:The patient has been provided with contact information for the care management team and has been advised to call with any health related questions or concerns.  No further follow up required: Case closed goals met MPeter GarterRN, [facility] CDE Care Management Coordinator [facility] ([phone]

## 2022-05-03 NOTE — Patient Instructions (Signed)
Visit Information  Thank you for allowing me to share the care management and care coordination services that are available to you as part of your health plan and services through your primary care provider and medical home. Please reach out to me at [phone] if the care management/care coordination team may be of assistance to you in the future.   Francetta Ilg RN, BSN,CCM, CDE Care Management Coordinator  [facility] [phone]   

## 2022-05-04 ENCOUNTER — Telehealth: Payer: Medicare Other

## 2022-05-05 ENCOUNTER — Telehealth: Payer: Self-pay

## 2022-05-05 DIAGNOSIS — E785 Hyperlipidemia, unspecified: Secondary | ICD-10-CM

## 2022-05-05 DIAGNOSIS — I639 Cerebral infarction, unspecified: Secondary | ICD-10-CM

## 2022-05-05 DIAGNOSIS — I10 Essential (primary) hypertension: Secondary | ICD-10-CM

## 2022-05-05 NOTE — Telephone Encounter (Signed)
Katie from [facility] called to inform us that they do not have any HH aides currently, suggested sending referral to [facility]. New referral placed & message sent to referral coordinator.

## 2022-05-08 ENCOUNTER — Ambulatory Visit: Payer: Self-pay | Admitting: Genetic Counselor

## 2022-05-08 DIAGNOSIS — Z1379 Encounter for other screening for genetic and chromosomal anomalies: Secondary | ICD-10-CM

## 2022-05-09 ENCOUNTER — Telehealth: Payer: Self-pay

## 2022-05-09 ENCOUNTER — Other Ambulatory Visit: Payer: Self-pay | Admitting: *Deleted

## 2022-05-09 DIAGNOSIS — C50212 Malignant neoplasm of upper-inner quadrant of left female breast: Secondary | ICD-10-CM

## 2022-05-11 ENCOUNTER — Encounter: Payer: Self-pay | Admitting: Genetic Counselor

## 2022-05-12 DIAGNOSIS — F0390 Unspecified dementia without behavioral disturbance: Secondary | ICD-10-CM | POA: Diagnosis not present

## 2022-05-12 DIAGNOSIS — I10 Essential (primary) hypertension: Secondary | ICD-10-CM

## 2022-05-12 DIAGNOSIS — E785 Hyperlipidemia, unspecified: Secondary | ICD-10-CM | POA: Diagnosis not present

## 2022-05-17 ENCOUNTER — Encounter: Payer: Self-pay | Admitting: Gynecologic Oncology

## 2022-05-18 ENCOUNTER — Telehealth: Payer: Self-pay

## 2022-05-18 NOTE — Telephone Encounter (Signed)
Spoke with patient's daughter Theodis Shove and scheduled a Mychart Palliative Consult for 05/29/22 @ 12:30 PM first available appointment with provider.   Consent obtained; updated Netsmart, Team List and Epic.

## 2022-05-19 ENCOUNTER — Encounter: Payer: Self-pay | Admitting: Gynecologic Oncology

## 2022-05-19 ENCOUNTER — Inpatient Hospital Stay: Payer: Medicare Other | Attending: Hematology and Oncology | Admitting: Gynecologic Oncology

## 2022-05-19 ENCOUNTER — Inpatient Hospital Stay: Payer: Medicare Other

## 2022-05-19 ENCOUNTER — Other Ambulatory Visit: Payer: Self-pay

## 2022-05-19 VITALS — BP 169/84 | HR 76 | Temp 98.6°F | Resp 16 | Ht 62.0 in | Wt 181.0 lb

## 2022-05-19 DIAGNOSIS — Z8041 Family history of malignant neoplasm of ovary: Secondary | ICD-10-CM | POA: Diagnosis not present

## 2022-05-19 DIAGNOSIS — C50212 Malignant neoplasm of upper-inner quadrant of left female breast: Secondary | ICD-10-CM

## 2022-05-19 DIAGNOSIS — R161 Splenomegaly, not elsewhere classified: Secondary | ICD-10-CM | POA: Insufficient documentation

## 2022-05-19 DIAGNOSIS — C773 Secondary and unspecified malignant neoplasm of axilla and upper limb lymph nodes: Secondary | ICD-10-CM | POA: Diagnosis not present

## 2022-05-19 DIAGNOSIS — D398 Neoplasm of uncertain behavior of other specified female genital organs: Secondary | ICD-10-CM | POA: Diagnosis not present

## 2022-05-19 DIAGNOSIS — Z7409 Other reduced mobility: Secondary | ICD-10-CM

## 2022-05-19 DIAGNOSIS — R590 Localized enlarged lymph nodes: Secondary | ICD-10-CM | POA: Diagnosis not present

## 2022-05-19 DIAGNOSIS — N9489 Other specified conditions associated with female genital organs and menstrual cycle: Secondary | ICD-10-CM

## 2022-05-19 DIAGNOSIS — Z993 Dependence on wheelchair: Secondary | ICD-10-CM | POA: Insufficient documentation

## 2022-05-19 DIAGNOSIS — C50511 Malignant neoplasm of lower-outer quadrant of right female breast: Secondary | ICD-10-CM | POA: Diagnosis not present

## 2022-05-19 DIAGNOSIS — Z803 Family history of malignant neoplasm of breast: Secondary | ICD-10-CM

## 2022-05-19 DIAGNOSIS — Z170 Estrogen receptor positive status [ER+]: Secondary | ICD-10-CM | POA: Diagnosis not present

## 2022-05-19 NOTE — Patient Instructions (Signed)
It was a pleasure meeting you today.  We discussed that your recent CT scan showed what looks like a solid mass on one of your ovaries.  To help get a better look at this, I have ordered an ultrasound.  We will also get a blood test that can sometimes be elevated in ovarian cancer.  My suspicion is, that if this represents a mass on the ovary, it is more likely related to your breast cancer then to a second cancer (ovarian cancer).  If we need more information after the pelvic ultrasound, we will plan to get a pelvic MRI.  At this time, I favor that we plan on close follow-up as you are undergoing treatment for your breast cancer.  If we see this area respond to your treatment along with the rest of your breast cancer, that helps support that this is likely metastatic breast cancer.  We also discussed that the 2 options for knowing for certain what the mass on her near your ovary represents our surgery to remove it or a biopsy.  We generally try to avoid doing a biopsy on ovarian masses, but given your risk related to surgery, this may be something that we discussed or pursue in the future.

## 2022-05-19 NOTE — Progress Notes (Signed)
GYNECOLOGIC ONCOLOGY NEW PATIENT CONSULTATION   Patient Name: [id number] Sheila Perez  Patient Age: 76 y.o. Date of Service: 05/19/22 Referring Provider: Dr. Nicholas Lose  Primary Care Provider: [name], [name], MD Consulting Provider: Jeral Pinch, MD   Assessment/Plan:  Postmenopausal patient with recently diagnosed ER/PR+ breast cancer incidentally found to have a solid-appearing adnexal mass on staging imaging.  Spoke with the patient daughter today about findings on recent CT scan.  We reviewed the images together.  Luckily, the patient is asymptomatic with regard to this mass.  We discussed the limitations of CT in terms of imaging gynecologic organs.  Pelvic ultrasound and pelvic MRI would be more helpful to characterize this adnexal mass.  I recommended that we proceed with pelvic MRI as well as CA-125.  We reviewed possible etiologies for this mass.  If truly adnexal, mass may represent a benign or malignant lesion.  In the setting of known metastatic breast cancer, metastasis to the ovary is certainly possible and I think more likely than a secondary ovarian malignancy.  Depending on pelvic ultrasound findings, we may discuss obtaining a pelvic MRI to better characterize the mass versus plan for evaluation on close follow-up imaging for her breast cancer.  We discussed that options for obtaining a definitive diagnosis include surgical excision and biopsy.  Generally we try to avoid biopsy with ovarian masses to avoid cancer seeding.  We will discuss this further after pelvic ultrasound is performed.  Unless there is significant concern for secondary malignancy based on additional imaging we plan to obtain, I recommend close surveillance, which patient will be obtaining 3 months after her last CT scan for her breast cancer.  If we were to see response in the adnexal mass concordant to what we are seeing in terms of her breast lesions, this would support diagnosis of metastatic breast  cancer.  Discordant response, then it may be worth discussing again possibility of biopsy.  Given her performance status and comorbidities, I do not think that the patient is a good surgical candidate; the patient's daughter is very much in agreement with this.  I will call the patient and her daughter once I have reviewed pelvic ultrasound images.  A copy of this note was sent to the patient's referring provider.   60 minutes of total time was spent for this patient encounter, including preparation, face-to-face counseling with the patient and coordination of care, and documentation of the encounter.   Jeral Pinch, MD  Division of Gynecologic Oncology  Department of Obstetrics and Gynecology  [facility]  ___________________________________________  Chief Complaint: Chief Complaint  Patient presents with   Malignant neoplasm of lower-outer quadrant of right breast    Malignant neoplasm of upper-inner quadrant of left breast i    History of Present Illness:  Sheila Perez is a 76 y.o. y.o. female who is seen in consultation at the request of Dr. Lindi Adie for an evaluation of a solid-appearing adnexal mass incidentally noted on staging CT for her breast cancer.   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  Staging CT scan showed bilateral breast masses, left axillary lymph node, right adrenal nodule, left adnexal mass.  Given performance status and comorbidities as well as possibility of metastatic disease, decision made to not pursue surgery.  Patient has been started on antiestrogen therapy with letrozole.  Plan is for repeat imaging in 3 months.  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  Patient presents to her visit today with her daughter.  She fell this morning.  She is mostly wheelchair-bound although is able to walk some with a walker.  There was some  suspicion she might have a neuromuscular disorder but it sounds like she is believe more likely to have weakness related to being sedentary.  She has a chart reported history of a stroke although her daughter notes that it is suspected she has had TIAs not a stroke.  Patient endorses a good appetite.  Patient denies any pelvic or abdominal pain.  She denies any vaginal bleeding or discharge.  She had 1 episode of nausea and associated emesis shortly after starting letrozole.  She endorses occasional constipation, otherwise has normal bowel movements.  Denies any urinary symptoms.  Patient's daughter thinks she has lost about 40 pounds in the last year but the patient has had significant changes to her diet including eating more plant-based foods.  Patient currently lives with her daughter.  She spends approximately 23 of 24 hours either sitting or in bed.  Treatment History: Oncology History  Malignant neoplasm of lower-outer quadrant of right breast of female, estrogen receptor positive ([place])  04/04/2022 Initial Diagnosis   Right breast mass 1.3 cm with calcifications 2 cm (calcs were not biopsied), biopsy grade 3 IDC ER 100%, PR 30%, HER2 equivocal by IHC, FISH pending, Ki-67 70%, 1 abnormal lymph node biopsy: Benign concordant   04/12/2022 Cancer Staging   Staging form: Breast, AJCC 8th Edition - Clinical: Stage IA (cT1c, cN0, cM0, G3, ER+, PR+, HER2: Equivocal) - Signed by Nicholas Lose, MD on 04/12/2022 Stage prefix: Initial diagnosis Histologic grading system: 3 grade system    Genetic Testing   [facility] CancerNext was Negative. 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 Report date is 04/25/2022.  The CancerNext gene panel offered by [employer] includes sequencing, rearrangement analysis, and RNA analysis for the following 36 genes:   APC, ATM, AXIN2, BARD1, BMPR1A, BRCA1, BRCA2, BRIP1, CDH1, CDK4, CDKN2A, CHEK2, DICER1, HOXB13, EPCAM, GREM1, MLH1,  MSH2, MSH3, MSH6, MUTYH, NBN, NF1, NTHL1, PALB2, PMS2, POLD1, POLE, PTEN, RAD51C, RAD51D, RECQL, SMAD4, SMARCA4, STK11, and TP53.    Malignant neoplasm of upper-inner quadrant of left breast in female, estrogen receptor positive ([place])  04/04/2022 Initial Diagnosis   Palpable left breast mass 9 o'clock position 2.9 cm, multiple left axillary lymph nodes with skin thickening biopsy: Positive lymph node, biopsy of the breast mass: Grade 3 IDC ER 70%, PR 5%, HER2 equivocal, FISH pending, Ki-67 30%   04/12/2022 Cancer Staging   Staging form: Breast, AJCC 8th Edition - Clinical: Stage IIB (cT2, cN1, cM0, G3, ER+, PR+, HER2: Equivocal) - Signed by Nicholas Lose, MD on 04/12/2022 Stage prefix: Initial diagnosis Histologic grading system: 3 grade system    PAST MEDICAL HISTORY:  Past Medical History:  Diagnosis Date   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   Blood transfusion without reported diagnosis    Diabetes mellitus without complication ([place])    prediabetic-no medications   Gait abnormality    Hyperlipidemia    Hypertension    Neuromuscular disorder ([place])    never damage for the way she walks   Stroke [facility])    mimi     PAST SURGICAL HISTORY:  Past Surgical History:  Procedure Laterality Date   ABDOMINAL HYSTERECTOMY     maxilo facial surgery      OB/GYN HISTORY:  OB History  Gravida Para Term Preterm AB  Living  3 3          SAB IAB Ectopic Multiple Live Births               # Outcome Date GA Lbr Len/2nd Weight Sex Delivery Anes PTL Lv  3 Para           2 Para           1 Para             No LMP recorded. Patient has had a hysterectomy.  Age at menarche: Approximately 16 Age at menopause: Patient is unsure, underwent hysterectomy about 20 years ago for fibroids and heavy bleeding Hx of HRT: Denies Hx of STDs: Denies Last pap: Unsure, thinks before her hysterectomy History of abnormal pap smears: Denies  SCREENING STUDIES:  Last mammogram: 2023  Last colonoscopy:  2023  MEDICATIONS: Outpatient Encounter Medications as of 05/19/2022  Medication Sig   acetaminophen (TYLENOL) 500 MG tablet Take 500 mg by mouth every 6 (six) hours as needed for mild pain.   amLODipine (NORVASC) 10 MG tablet Take 1 tablet (10 mg total) by mouth daily.   atorvastatin (LIPITOR) 40 MG tablet Take 1 tablet (40 mg total) by mouth daily.   cholecalciferol (VITAMIN D3) 25 MCG (1000 UNIT) tablet Take 1,000 Units by mouth daily.   co-enzyme Q-10 30 MG capsule Take 30 mg by mouth daily.   letrozole (FEMARA) 2.5 MG tablet Take 1 tablet (2.5 mg total) by mouth daily.   omega-3 acid ethyl esters (LOVAZA) 1 g capsule Take 1 g by mouth 2 (two) times daily.   No facility-administered encounter medications on file as of 05/19/2022.    ALLERGIES:  No Known Allergies   FAMILY HISTORY:  Family History  Problem Relation Age of Onset   Kidney disease Mother    Diabetes Mother    Alcohol abuse Father    Diabetes Sister    Breast cancer Sister    Diabetes Sister    Ovarian cancer Sister 65   Breast cancer Sister    Cancer Brother    Diabetes Daughter    Breast cancer Niece    Colon cancer Neg Hx    Colon polyps Neg Hx    Heart disease Neg Hx    Rectal cancer Neg Hx    Stomach cancer Neg Hx      SOCIAL HISTORY:  Social Connections: Socially Isolated (04/19/2021)   Social Connection and Isolation Panel [NHANES]    Frequency of Communication with Friends and Family: More than three times a week    Frequency of Social Gatherings with Friends and Family: More than three times a week    Attends Religious Services: Never    Marine scientist or Organizations: No    Attends Archivist Meetings: Never    Marital Status: Widowed    REVIEW OF SYSTEMS:  + Fatigue, leg swelling, joint pain, problem walking, swollen lymph nodes. Denies appetite changes, fevers, chills, unexplained weight changes. Denies hearing loss, neck lumps or masses, mouth sores, ringing in ears or  voice changes. Denies cough or wheezing.  Denies shortness of breath. Denies chest pain or palpitations.  Denies abdominal distention, pain, blood in stools, constipation, diarrhea, nausea, vomiting, or early satiety. Denies pain with intercourse, dysuria, frequency, hematuria or incontinence. Denies hot flashes, pelvic pain, vaginal bleeding or vaginal discharge.   Denies back pain or muscle pain/cramps. Denies itching, rash, or wounds. Denies dizziness, headaches, numbness or seizures. Denies easy  bruising or bleeding. Denies anxiety, depression, confusion, or decreased concentration.  Physical Exam:  Vital Signs for this encounter:  Blood pressure (!) 169/84, pulse 76, temperature 98.6 F (37 C), temperature source Oral, resp. rate 16, height $RemoveBe'5\' 2"'vShFCMcUt$  (1.575 m), weight 181 lb (82.1 kg), SpO2 100 %. Body mass index is 33.11 kg/m. General: Alert, oriented, no acute distress.  HEENT: Normocephalic, atraumatic. Sclera anicteric.  Chest: Clear to auscultation bilaterally. No wheezes, rhonchi, or rales. Cardiovascular: Regular rate and rhythm, no murmurs, rubs, or gallops.  Abdomen: Obese. Normoactive bowel sounds. Soft, nondistended, nontender to palpation. No masses or hepatosplenomegaly appreciated. No palpable fluid wave.  Well-healed infraumbilical incision. Extremities: Grossly normal range of motion. Warm, well perfused.  Trace edema bilaterally.  Skin: No rashes or lesions.  Lymphatics: No cervical, supraclavicular, or inguinal adenopathy.  GU:  Normal external female genitalia.  No lesions. No discharge or bleeding.             Bladder/urethra:  No lesions or masses.             Vagina: Well rugated, no lesions or masses.             Cervix/uterus: Surgically absent.             Adnexa: Fullness appreciated on bimanual exam at the apex of the vagina, feels somewhat fixed, smooth.    Rectal: Deferred.  LABORATORY AND RADIOLOGIC DATA:  Outside medical records were reviewed to  synthesize the above history, along with the history and physical obtained during the visit.   Lab Results  Component Value Date   WBC 5.6 04/12/2022   HGB 13.1 04/12/2022   HCT 38.4 04/12/2022   PLT 227 04/12/2022   GLUCOSE 105 (H) 04/12/2022   CHOL 258 (H) 03/17/2022   TRIG 72 03/17/2022   HDL 39 (L) 03/17/2022   LDLCALC 202 (H) 03/17/2022   ALT 7 04/12/2022   AST 12 (L) 04/12/2022   NA 140 04/12/2022   K 3.8 04/12/2022   CL 104 04/12/2022   CREATININE 0.92 04/12/2022   BUN 11 04/12/2022   CO2 28 04/12/2022   TSH 1.26 03/17/2022   INR 1.1 01/26/2020   HGBA1C 6.2 08/01/2021   CT A/P on 04/21/22: IMPRESSION: 1. Bilateral breast masses, measuring 2.8 cm on the left and 1.3 cm on the right. 2. Left axillary lymphadenopathy, compatible with nodal metastases. 3. Right adrenal 1.1 cm nodule, indeterminate, cannot exclude adrenal metastasis. 4. Hypodense 2.4 cm splenic mass, nonspecific, cannot exclude splenic metastasis. 5. Solid 5.2 cm left adnexal mass, suspicious for left ovarian neoplasm or metastasis. Pelvic ultrasound correlation may be considered. 6. Aortic Atherosclerosis (ICD10-I70.0).

## 2022-05-20 LAB — CA 125: Cancer Antigen (CA) 125: 2.9 U/mL (ref 0.0–38.1)

## 2022-05-23 ENCOUNTER — Ambulatory Visit (HOSPITAL_COMMUNITY): Admission: RE | Admit: 2022-05-23 | Payer: Medicare Other | Source: Ambulatory Visit

## 2022-05-23 ENCOUNTER — Encounter: Payer: Self-pay | Admitting: *Deleted

## 2022-05-29 ENCOUNTER — Ambulatory Visit: Payer: Medicare Other | Admitting: Physician Assistant

## 2022-05-29 ENCOUNTER — Telehealth: Payer: Medicare Other | Admitting: Internal Medicine

## 2022-05-29 ENCOUNTER — Encounter: Payer: Self-pay | Admitting: Physician Assistant

## 2022-05-29 DIAGNOSIS — C50511 Malignant neoplasm of lower-outer quadrant of right female breast: Secondary | ICD-10-CM

## 2022-05-29 DIAGNOSIS — Z515 Encounter for palliative care: Secondary | ICD-10-CM

## 2022-05-29 DIAGNOSIS — Z029 Encounter for administrative examinations, unspecified: Secondary | ICD-10-CM

## 2022-05-29 DIAGNOSIS — N9489 Other specified conditions associated with female genital organs and menstrual cycle: Secondary | ICD-10-CM

## 2022-05-29 DIAGNOSIS — C50212 Malignant neoplasm of upper-inner quadrant of left female breast: Secondary | ICD-10-CM

## 2022-05-29 DIAGNOSIS — Z170 Estrogen receptor positive status [ER+]: Secondary | ICD-10-CM | POA: Diagnosis not present

## 2022-05-29 DIAGNOSIS — Z7409 Other reduced mobility: Secondary | ICD-10-CM | POA: Diagnosis not present

## 2022-05-29 NOTE — Progress Notes (Deleted)
Assessment/Plan:   Mild Cognitive Impairment   [id number] Sheila Perez is a very pleasant 76 y.o. RH female with a history of prediabetes, cerebral amyloid angiopathy with left-sided headache, presenting today in follow-up for evaluation of memory loss.   he is on   Recommendations:    Continue donepezil 10 mg daily Side effects were discussed Follow up in   months.   Case discussed with Dr. Delice Lesch who agrees with the plan   1.  Acute on chronic lower extremity weakness - She has longstanding history of weakness.  She did not have requested testing performed, so a diagnosis cannot be made.  But I believe that her worsening weakness was secondary to UTI and deconditioning. 2.  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  Involvement of deep gray matter, brainstem and cerebellum typically seen with hypertension.  There appears more abundant microhemorrhages in the cerebral white matter and cortex, which may be seen in cerebral amyloid angiopathy.  Also, blood pressure over the past year has been relatively controlled, so I cannot attribute this increase to blood pressure. 3.  Cerebrovascular disease 4.  Vascular-related mild neurocognitive disorder     Management of stroke risk factors (blood pressure, hyperlipidemia, glycemic control) as per PCP, but would avoid antiplatelet therapy due to cerebral amyloid angiopathy Increase physical activity/exercise Mediterranean diet Follow up in 6 months with Sharene Butters, PA-C  Subjective:   This patient is accompanied in the office by her who supplements the history.  Previous records as well as any outside records available were reviewed prior to todays visit.  Patient was last seen at our office on  at which time her   Patient is currently on    Any changes in memory since last visit? Patient lives with:   repeats oneself?  Endorsed Disoriented when walking into a room?  Patient denies   Leaving objects in unusual places?  Patient denies   Ambulates  with difficulty?   Patient denies   Recent falls?  Patient denies   Any head injuries?  Patient denies   History of seizures?   Patient denies   Wandering behavior?  Patient denies   Patient drives?   Patient no longer drives  Any mood changes such irritability agitation?  Patient denies   Any history of depression?:  Patient denies   Hallucinations?  Patient denies   Paranoia?  Patient denies   Patient reports that he sleeps well without vivid dreams, REM behavior or sleepwalking   History of sleep apnea?  Patient denies   Any hygiene concerns?  Patient denies   Independent of bathing and dressing?  Endorsed  Does the patient needs help with medications?  Denies Who is in charge of the finances?   is in charge    Any changes in appetite?  Patient denies   Patient have trouble swallowing? Patient denies   Does the patient cook?  Patient denies   Any kitchen accidents such as leaving the stove on? Patient denies   Any headaches?  Patient denies   Double vision? Patient denies   Any focal numbness or tingling?  Patient denies   Chronic back pain Patient denies   Unilateral weakness?  Patient denies   Any tremors?  Patient denies   Any history of anosmia?  Patient denies   Any incontinence of urine?  Patient denies   Any bowel dysfunction?  Patient denies        HISTORY: She had an episode of difficulty forming words on 01/25/2020 for which she went to the ED.  CT and MRI of brain were negative for acute abnormality but did demonstrated significant chronic small vessel ischemic changes.  She was diagnosed with right-sided Bell's palsy and discharged on anti-viral and a prednisone taper.  Slurred speech has resolved gradually over the next week.  Advised to start ASA '81mg'$  daily.     Of note, she reports 5 year history of weakness in both  legs.  No back pain or pain in legs.  No lower extremity numbness.  She has fallen once.  She reportedly is ordered for vascular studies.  She takes atorvastatin which has only recently been started.  She has never before been on a statin.  Again, no trouble swallowing.  Denies double vision.  No bowel or bladder retention.  Her daughter states that she has a sedentary lifestyle.  Patellar reflexes were brisk.  MRI cervical spine was ordered but never performed.  CK was 86.   She was seen by her PCP in February 2022 for new onset left-sided headache with vomiting.  No prior history of headache.  Sed rate on 01/07/2021 was 38 with elevated CRP of 16.1. Repeat CRP on 01/28/2021 was 19.5.  CT head on 01/07/2021 personally reviewed showed 4 mm hyperdensity within the left temporal lobe.  Follow up MRI of brain with and without contrast on 01/12/2021 personally reviewed was limited due to motion.  Moderate to advanced chronic small vessel ischemic changes in cerebral white matter and pons noted as well as innumerable chronic microhemorrhages involving the the subcortical white matter, deep gray nuclei, brainstem and cerebellum suggestive of chronic hypertension, cerebral amyloid angiopathy or both.  She was advised to discontinue ASA, NSAIDs and fish oil.  Due to motion artifact, uncertain to identify finding on previous CT, so repeat CT head performed on 01/21/2021 personally reviewed showed unchanged punctate hyperdensity in left temporal lobe, thought to be calcification.  Headache resolved after 3 days .  She may have a brief headache once in a while since then.  Denies memory deficits.  Blood pressure elevated today.  She reports that she has not been taking her blood pressure medication.  She was referred for left temporal artery biopsy but patient refused the biopsy.  However, headaches reportedly improved.  Repeat CRP in June 2022 was 1.5.    01/26/2020 CT HEAD WO:  1. No acute intracranial findings. 2.  Periventricular white matter and corona radiata hypodensities favor chronic ischemic microvascular white matter disease. 3. 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 4. Mildly irregular 1.7 by 1.9 cm lucent lesion along the hard palate, with possibilities including nasopalatine duct cyst, periapical granuloma, schwannoma, or malignancy such as adenoid cystic carcinoma. There is a note in the patient's surgical history of prior maxillofacial surgery, if this was in the vicinity of the maxilla/hard palate then correlation with that operation would be recommended. If the patient's prior surgery is not connected to this lesion, then maxillofacial MRI with and without contrast with attention to this hard palate mass would be recommended.  5. Chronic bilateral maxillary sinusitis.   01/26/2020 MRI BRAIN WO:  Brain: No acute infarct. Increased diffusion signal at the right basal ganglia is at the level of prior infarction and hemosiderin staining. Remote lacunar infarcts at the deep gray nuclei, brainstem, and right middle cerebellar peduncle. Confluent ischemic gliosis in the deep cerebral  white matter. No acute hemorrhage.  Chronic blood products are seen along the bilateral posterior cerebral cortex, which may be posttraumatic. Remote micro hemorrhages at the basal ganglia and left cerebellum, likely from small vessel disease. Cerebral volume is overall normal; there is some atrophy of the brainstem which is likely post ischemic. No masslike finding or extra-axial collection.  Vascular: Preserved flow voids.  Skull and upper cervical spine: No evidence of bone lesion.  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   Blood transfusion without reported diagnosis    Diabetes mellitus without complication ([place])    prediabetic-no medications   Gait abnormality    Hyperlipidemia    Hypertension    Neuromuscular disorder ([place])    never damage for  the way she walks   Stroke [facility])    mimi     Past Surgical History:  Procedure Laterality Date   ABDOMINAL HYSTERECTOMY     maxilo facial surgery       PREVIOUS MEDICATIONS:   CURRENT MEDICATIONS:  Outpatient Encounter Medications as of 05/29/2022  Medication Sig   acetaminophen (TYLENOL) 500 MG tablet Take 500 mg by mouth every 6 (six) hours as needed for mild pain.   amLODipine (NORVASC) 10 MG tablet Take 1 tablet (10 mg total) by mouth daily.   atorvastatin (LIPITOR) 40 MG tablet Take 1 tablet (40 mg total) by mouth daily.   cholecalciferol (VITAMIN D3) 25 MCG (1000 UNIT) tablet Take 1,000 Units by mouth daily.   co-enzyme Q-10 30 MG capsule Take 30 mg by mouth daily.   letrozole (FEMARA) 2.5 MG tablet Take 1 tablet (2.5 mg total) by mouth daily.   omega-3 acid ethyl esters (LOVAZA) 1 g capsule Take 1 g by mouth 2 (two) times daily.   No facility-administered encounter medications on file as of 05/29/2022.     Objective:     PHYSICAL EXAMINATION:    VITALS:  There were no vitals filed for this visit.  GEN:  The patient appears stated age and is in NAD. HEENT:  Normocephalic, atraumatic.   Neurological examination:  General: NAD, well-groomed, appears stated age. Orientation: The patient is alert. Oriented to person, place and date Cranial nerves: There is good facial symmetry.The speech is fluent and clear. No aphasia or dysarthria. Fund of knowledge is appropriate. Recent memory impaired and remote memory is normal.  Attention and concentration are normal.  Able to name objects and repeat phrases.  Hearing is intact to conversational tone.    Sensation: Sensation is intact to light touch throughout Motor: Strength is at least antigravity x4. Tremors: none  DTR's 2/4 in UE/LE       No data to display              No data to display             Movement examination: Tone: There is normal tone in the UE/LE Abnormal movements:  no tremor.  No myoclonus.   No asterixis.   Coordination:  There is no decremation with RAM's. Normal finger to nose  Gait and Station: The patient has no difficulty arising out of a deep-seated chair without the use of the hands. The patient's stride length is good.  Gait is cautious and narrow.   Thank you for allowing [place] the opportunity to participate in the care of this nice patient. Please do not hesitate to contact us for any questions or concerns.   Total  time spent on today's visit was *** minutes dedicated to this patient today, preparing to see patient, examining the patient, ordering tests and/or medications and counseling the patient, documenting clinical information in the EHR or other health record, independently interpreting results and communicating results to the patient/family, discussing treatment and goals, answering patient's questions and coordinating care.  Cc:  [name], [name], MD  Sharene Butters 05/29/2022 12:46 PM   Cc:  [name], [name], MD Sharene Butters, PA-C

## 2022-05-29 NOTE — Progress Notes (Signed)
[facility] Consult Note Telephone: [phone]  Fax: [phone]   Date of encounter: 05/29/22 8:42 AM PATIENT NAME: Sheila Perez [place] [address]   [phone] (home)  DOB: 07/23/1946 MRN: [id number] PRIMARY CARE PROVIDER:    [name], [name], MD,  [place] [address] [phone]  REFERRING PROVIDER:   Dr. Nicholas Lose with oncology  RESPONSIBLE PARTY:    Contact Information     Name Relation Home Work Mobile   [place] Daughter [phone]  [phone]   Janit Bern   [phone]   Pierce,Intisar Daughter   [phone]        Due to the COVID-19 crisis, this visit was done via telemedicine from my office and it was initiated and consent by this patient and or family.  I connected with  Camino [place] PROXY on 05/29/22 by a video enabled telemedicine application and verified that I am speaking with the correct person using two identifiers.   I discussed the limitations of evaluation and management by telemedicine. The patient expressed understanding and agreed to proceed.  Palliative Care was asked to follow this patient by consultation request of  [name], [name], MD to address advance care planning and complex medical decision making. This is the initial visit.                                     ASSESSMENT AND PLAN / RECOMMENDATIONS:   Advance Care Planning/Goals of Care: Goals include to maximize quality of life and symptom management. Patient/health care surrogate gave his/her permission to discuss.Our advance care planning conversation included a discussion about:    The value and importance of advance care planning  Experiences with loved ones who have been seriously ill or have died  Exploration of personal, cultural or spiritual beliefs that might influence medical decisions  Exploration of goals of care in the  event of a sudden injury or illness  Identification  of a healthcare agent--has HCPOA and living. Review and updating or creation of an  advance directive document . Decision not to resuscitate or to de-escalate disease focused treatments due to poor prognosis. CODE STATUS:  DNR--was a tough decision, but Sheila Perez's biggest concern is her mom's QOL.  Symptom Management/Plan: 1. Malignant neoplasm of lower-outer quadrant of right breast of female, estrogen receptor positive ([facility]) 2. Malignant neoplasm of upper-inner quadrant of left breast in female, estrogen receptor positive ([facility]) -remains on letrozole at present though benefit limited especially if this adnexal mass is indeed metastatic disease -pt having more pain and decreased mobility -discussed option of hospice care at this time with Sheila Perez--she wanted to think about this and was provided my information to call us back if she wanted to proceed   3. Adnexal mass -for [place] and possible MRI to better categorize; however, she's been deemed not a surgical candidate and goals are palliative at this time so benefit of more imaging with challenges leaving home and moving around is questionable now as it seems it will not change management with her PPS being 40% now   4. Limited mobility -requiring help with ADLs now and getting weaker -Sheila Perez is wanting to keep her at home and focus on her comfort  5. Palliative care by specialist -recommended hospice care at this time and Sheila Perez was thinking about this and going to get back with me  after speaking with other family members   Follow up Palliative Care Visit: Await Sheila Perez's decision about hospice  This visit was coded based on medical decision making (MDM). 20 mins spent on ACP  PPS: 40%  HOSPICE ELIGIBILITY/DIAGNOSIS: Metastatic breast cancer/yes  Chief Complaint: Initial palliative consult  HISTORY OF PRESENT ILLNESS:  Sheila Perez is a 76 y.o. year old female  with  bilateral breast cancer ER positive for which she's seen Dr. Lindi Adie in June, adnexal mass noted on CT imaging.  She has had consult with Dr. Berline Lopes from ob/gyn, but not felt to be a good surgical candidate, and, of course, biopsies to be avoided due to potential seeding if it is a separate ovarian mass rather than metastatic disease.  Notes indicate that pt's daughter agrees with a palliative approach, avoiding surgery.  She has been placed on letrozolIe and is for [place] tomorrow and possibly pelvic MRI in the future for better assessment of mass.  Dr. Lindi Adie has referred her to [place].   She is getting progressively worse.  Sheila Perez just gave her a shower.  She had been able to lift her leg to get out of the tub, but today, it took 30 mins to get her back out.  She is slowing down considerably.  She has a lot of pain on her left side.  She has a lift chair, but still needs assistance standing up so Sheila Perez stands in front her her to help her up.  Her left arm and leg both hurt.    Pain does improve within 20 mins of receiving curamen.    She wants to stay at home.  She does not qualify for medicaid.  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     Initially, one of the goals was to get her to move more.  She's now having pain though--lymph nodes under left arm cause pain in left arm to hurt.  Using curamen when c/o pain.  Leg is more intermittent.  At certain point using lift chair, the pain starts.  They do have aide to help with cleaning, washing linens daily from 9-12.  Sheila Perez gives her breakfast and meds.  She'd tried to keep her physically active.  She does have a shower chair.  Appetite is good--loves to eat--she's on local chicken, veggies, beans, lentils.  Bowels moving ok this week.  Had a period of 4 days last week where she had not gone.  She likes tea==special one to help move bowels. She is having incontinence of her bowels--three occasions of this so far.  Yesterday did not know it was coming at all.   Urine, she has very little warning.  She was struggling at one time to sleep so taking magnesium for that and it's better.   It's really hard to get her mom out.  Sheila Perez questions of treatment change with the tests like ultrasound and MRI.     Not really anxious.    History obtained from review of EMR, discussion with primary team, and interview with family, facility staff/caregiver and/or Sheila Perez.   I reviewed available labs, medications, imaging, studies and related documents from the EMR.  Records reviewed and summarized above.   ROS Review of Systems  Constitutional:  Positive for activity change and fatigue. Negative for appetite change, chills and fever.  HENT:  Negative for trouble swallowing.   Eyes:  Negative for visual disturbance.  Respiratory:  Negative for shortness of breath.   Cardiovascular:  Negative for  leg swelling.  Gastrointestinal:  Negative for constipation.  Genitourinary:  Negative for dysuria.  Musculoskeletal:  Positive for arthralgias and gait problem.  Neurological:  Positive for weakness. Negative for dizziness.  Psychiatric/Behavioral:  Negative for sleep disturbance.     Physical Exam:  209 lb in Jan and 181 lbs 05/19/22 There were no vitals filed for this visit. There is no height or weight on file to calculate BMI. Wt Readings from Last 500 Encounters:  05/19/22 181 lb (82.1 kg)  04/12/22 177 lb 3.2 oz (80.4 kg)  04/03/22 181 lb (82.1 kg)  03/17/22 181 lb (82.1 kg)  12/07/21 209 lb (94.8 kg)  11/25/21 209 lb (94.8 kg)  11/06/21 200 lb (90.7 kg)  10/12/21 209 lb (94.8 kg)  08/29/21 201 lb (91.2 kg)  08/01/21 206 lb 4 oz (93.6 kg)  07/20/21 208 lb (94.3 kg)  02/09/21 208 lb (94.3 kg)  01/07/21 209 lb 4 oz (94.9 kg)  09/08/20 211 lb (95.7 kg)  05/19/20 207 lb 2 oz (94 kg)  03/29/20 206 lb 6.4 oz (93.6 kg)  02/18/20 205 lb 9.6 oz (93.3 kg)  12/05/19 199 lb (90.3 kg)   Physical Exam Constitutional:      Appearance: She is ill-appearing.   Eyes:     Conjunctiva/sclera: Conjunctivae normal.  Pulmonary:     Effort: Pulmonary effort is normal.  Musculoskeletal:     Comments: Limited mobility of UEs due to pain.    Neurological:     Mental Status: She is alert and oriented to person, place, and time.  Psychiatric:        Mood and Affect: Mood normal.     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[facility]) 04/18/2021   Morbid obesity ([facility]) 05/19/2020   PAD (peripheral artery disease) ([facility]) 05/07/2020   Urinary incontinence in female 02/27/2020   Hyperlipidemia 02/18/2020   Prediabetes 02/18/2020   CVA (cerebral vascular accident) ([facility]) 02/18/2020   Hypertension, essential, benign 01/30/2020   PAST MEDICAL HISTORY:  Active Ambulatory Problems    Diagnosis Date Noted   Hypertension, essential, benign 01/30/2020   Hyperlipidemia 02/18/2020   Prediabetes 02/18/2020   CVA (cerebral vascular accident) ([facility]) 02/18/2020   Urinary incontinence in female 02/27/2020   PAD (peripheral artery disease) ([facility]) 05/07/2020   Morbid obesity ([facility]) 05/19/2020   Vitamin D deficiency, unspecified 04/18/2021   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[facility]) 04/18/2021   Limited mobility 01/09/2022   Malignant neoplasm of lower-outer quadrant of right breast of female, estrogen receptor positive ([facility]) 04/11/2022   Malignant neoplasm of upper-inner quadrant of left breast in female, estrogen receptor  positive ([facility]) 04/11/2022   Family history of breast cancer 04/13/2022   Family history of ovarian cancer 04/13/2022   Aortic atherosclerosis ([facility]) 04/24/2022   Genetic testing 04/28/2022   Adnexal mass 05/19/2022   Resolved Ambulatory Problems    Diagnosis Date Noted   No Resolved Ambulatory Problems   Past Medical History:  Diagnosis Date   Bil breast  ca 04/2022   Blood transfusion without reported diagnosis    Diabetes mellitus without complication (HCC)    Gait abnormality    Hypertension    Neuromuscular disorder (HCC)    Stroke ([facility])    SOCIAL HX:  Social History   Tobacco Use   Smoking status: Never   Smokeless tobacco: Never  Substance Use Topics   Alcohol use: No   FAMILY HX:  Family History  Problem Relation Age of Onset   Kidney disease Mother    Diabetes Mother    Alcohol abuse Father    Diabetes Sister    Breast cancer Sister    Diabetes Sister    Ovarian cancer Sister 67   Breast cancer Sister    Cancer Brother    Diabetes Daughter    Breast cancer Niece    Colon cancer Neg Hx    Colon polyps Neg Hx    Heart disease Neg Hx    Rectal cancer Neg Hx    Stomach cancer Neg Hx       ALLERGIES: No Known Allergies    PERTINENT MEDICATIONS:  Outpatient Encounter Medications as of 05/29/2022  Medication Sig   acetaminophen (TYLENOL) 500 MG tablet Take 500 mg by mouth every 6 (six) hours as needed for mild pain.   amLODipine (NORVASC) 10 MG tablet Take 1 tablet (10 mg total) by mouth daily.   atorvastatin (LIPITOR) 40 MG tablet Take 1 tablet (40 mg total) by mouth daily.   cholecalciferol (VITAMIN D3) 25 MCG (1000 UNIT) tablet Take 1,000 Units by mouth daily.   co-enzyme Q-10 30 MG capsule Take 30 mg by mouth daily.   letrozole (FEMARA) 2.5 MG tablet Take 1 tablet (2.5 mg total) by mouth daily.   omega-3 acid ethyl esters (LOVAZA) 1 g capsule Take 1 g by mouth 2 (two) times daily.   No facility-administered encounter medications on file as of  05/29/2022.    Thank you for the opportunity to participate in the care of Ms. Gabbard.  The palliative care team will continue to follow. Please call our office at [phone] if we can be of additional assistance.   Hollace Kinnier, DO  COVID-19 PATIENT SCREENING TOOL Asked and negative response unless otherwise noted:  Have you had symptoms of covid, tested positive or been in contact with someone with symptoms/positive test in the past 5-10 days?  NO

## 2022-05-30 ENCOUNTER — Ambulatory Visit (HOSPITAL_COMMUNITY): Admission: RE | Admit: 2022-05-30 | Payer: Medicare Other | Source: Ambulatory Visit

## 2022-05-30 ENCOUNTER — Telehealth: Payer: Self-pay

## 2022-05-30 NOTE — Telephone Encounter (Signed)
Pt's daughter aware ultrasound has been cancelled for today. She will call if anything is needed in the future.

## 2022-05-30 NOTE — Telephone Encounter (Signed)
Sheila Perez's daughter, Theodis Shove, called this morning stating she wants to cancel the ultrasound scheduled for today due to the fact the family has talked to palliative care Dr.Tiffany Reed, and they are leaning towards getting pt into hospice care. [name] states hospice is the next step into getting "mom comfortable" and she doesn't see a need in getting the ultrasound.   Theodis Shove is aware I will notify Dr.Tucker. I did offer her the option of having a phone visit if Dr.Tucker wanted to go this route and she said she would do a phone visit if needed.

## 2022-06-02 ENCOUNTER — Encounter: Payer: Self-pay | Admitting: Internal Medicine

## 2022-06-05 ENCOUNTER — Telehealth: Payer: Medicare Other

## 2022-06-21 ENCOUNTER — Telehealth: Payer: Self-pay

## 2022-06-21 NOTE — Telephone Encounter (Signed)
Can you get her scheduled for a lab appointment? She does need to fast.

## 2022-06-21 NOTE — Telephone Encounter (Signed)
-----   Message from Rodrigo Ran, [place] sent at 03/20/2022  4:09 PM EDT ----- FLP & ALT In 3 months.

## 2022-06-30 ENCOUNTER — Other Ambulatory Visit: Payer: Medicare Other

## 2022-07-07 ENCOUNTER — Ambulatory Visit: Payer: Medicare Other

## 2022-07-13 ENCOUNTER — Other Ambulatory Visit: Payer: Self-pay | Admitting: Gynecologic Oncology

## 2022-07-13 DIAGNOSIS — N9489 Other specified conditions associated with female genital organs and menstrual cycle: Secondary | ICD-10-CM

## 2023-03-29 ENCOUNTER — Telehealth: Payer: Self-pay | Admitting: Family Medicine

## 2023-03-29 NOTE — Telephone Encounter (Signed)
Contacted [name] to schedule their annual wellness visit. Appointment made for 04/05/23.  Rudell Cobb AWV direct phone # [phone]

## 2023-04-05 ENCOUNTER — Encounter: Payer: Medicare Other | Admitting: Family Medicine

## 2023-04-05 NOTE — Progress Notes (Deleted)
PATIENT CHECK-IN and HEALTH RISK ASSESSMENT QUESTIONNAIRE:  -completed by phone/video for upcoming Medicare Preventive Visit  Pre-Visit Check-in: 1)Vitals (height, wt, BP, etc) - record in vitals section for visit on day of visit 2)Review and Update Medications, Allergies PMH, Surgeries, Social history in Epic 3)Hospitalizations in the last year with date/reason? ***  4)Review and Update Care Team (patient's specialists) in Epic 5) Complete PHQ9 in Epic  6) Complete Fall Screening in Epic 7)Review all Health Maintenance Due and order under PCP if not done.  8)Medicare Wellness Questionnaire: Answer theses question about your habits: Do you drink alcohol? *** If yes, how many drinks do you have a day?*** Have you ever smoked?*** Quit date if applicable? ***  How many packs a day do/did you smoke? *** Do you use smokeless tobacco?*** Do you use an illicit drugs?*** Do you exercises? ***IF so, what type and how many days/minutes per week?*** Are you sexually active? ***Number of partners?*** Typical breakfast**** Typical lunch*** Typical dinner*** Typical snacks:****  Beverages: ***  Answer theses question about you: Can you perform most household chores?*** Do you find it hard to follow a conversation in a noisy room?*** Do you often ask people to speak up or repeat themselves?*** Do you feel that you have a problem with memory?*** Do you balance your checkbook and or bank acounts?*** Do you feel safe at home?*** Last dentist visit?*** Do you need assistance with any of the following: Please note if so ***  Driving?  Feeding yourself?  Getting from bed to chair?  Getting to the toilet?  Bathing or showering?  Dressing yourself?  Managing money?  Climbing a flight of stairs  Preparing meals?  Do you have Advanced Directives in place (Living Will, Healthcare Power or Attorney)? ***   Last eye Exam and location?***   Do you currently use prescribed or non-prescribed  narcotic or opioid pain medications?***  Do you have a history or close family history of breast, ovarian, tubal or peritoneal cancer or a family member with BRCA (breast cancer susceptibility 1 and 2) gene mutations?  Nurse/Assistant Credentials/time stamp:   ----------------------------------------------------------------------------------------------------------------------------------------------------------------------------------------------------------------------   MEDICARE ANNUAL PREVENTIVE VISIT WITH PROVIDER: (Welcome to [employer], initial annual wellness or annual wellness exam)  Virtual Visit via Video***Phone Note  I connected with ZOXWRUEA Olen Cordial on 04/05/23 by phone *** a video enabled telemedicine application and verified that I am speaking with the correct person using two identifiers.  Location patient: home Location provider:work or home office Persons participating in the virtual visit: patient, provider  Concerns and/or follow up today:   See HM section in Epic for other details of completed HM.    ROS: negative for report of fevers, unintentional weight loss, vision changes, vision loss, hearing loss or change, chest pain, sob, hemoptysis, melena, hematochezia, hematuria, falls, bleeding or bruising, thoughts of suicide or self harm, memory loss  Patient-completed extensive health risk assessment - reviewed and discussed with the patient: See Health Risk Assessment completed with patient prior to the visit either above or in recent phone note. This was reviewed in detailed with the patient today and appropriate recommendations, orders and referrals were placed as needed per Summary below and patient instructions.   Review of Medical History: -PMH, PSH, Family History and current specialty and care providers reviewed and updated and listed below   Patient Care Team: [place], [name], MD as PCP - General (Family Medicine) Drema Dallas, DO as Consulting  Physician (Neurology) [place], [name], MD (Family Medicine) Lu Duffel,  Margretta Ditty, RN as Oncology Nurse Navigator Donnelly Angelica, RN as Oncology Nurse Navigator Harriette Bouillon, MD as Consulting Physician (General Surgery) Serena Croissant, MD as Consulting Physician (Hematology and Oncology) Lonie Peak, MD as Attending Physician (Radiation Oncology)   Past Medical History:  Diagnosis Date   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   Blood transfusion without reported diagnosis    Diabetes mellitus without complication (HCC)    prediabetic-no medications   Gait abnormality    Hyperlipidemia    Hypertension    Neuromuscular disorder (HCC)    never damage for the way she walks   Stroke [facility])    mimi    Past Surgical History:  Procedure Laterality Date   ABDOMINAL HYSTERECTOMY     maxilo facial surgery      Social History   Socioeconomic History   Marital status: Widowed    Spouse name: Not on file   Number of children: 2   Years of education: 10   Highest education level: 10th grade  Occupational History   Occupation: Retired  Tobacco Use   Smoking status: Never   Smokeless tobacco: Never  Vaping Use   Vaping Use: Never used  Substance and Sexual Activity   Alcohol use: No   Drug use: No   Sexual activity: Not Currently  Other Topics Concern   Not on file  Social History Narrative   ** Merged History Encounter **       Right handed Lives daughter in a two story home but lives on first floor Drinks no caffeine    Social Determinants of Health   Financial Resource Strain: Low Risk  (04/03/2022)   Overall Financial Resource Strain (CARDIA)    Difficulty of Paying Living Expenses: Not hard at all  Food Insecurity: No Food Insecurity (04/03/2022)   Hunger Vital Sign    Worried About Running Out of Food in the Last Year: Never true    Ran Out of Food in the Last Year: Never true  Transportation Needs: No Transportation Needs (04/03/2022)   PRAPARE - Scientist, research (physical sciences) (Medical): No    Lack of Transportation (Non-Medical): No  Physical Activity: Inactive (04/03/2022)   Exercise Vital Sign    Days of Exercise per Week: 0 days    Minutes of Exercise per Session: 0 min  Stress: No Stress Concern Present (04/03/2022)   [employer] of Occupational Health - Occupational Stress Questionnaire    Feeling of Stress : Not at all  Social Connections: Socially Isolated (04/19/2021)   Social Connection and Isolation Panel [NHANES]    Frequency of Communication with Friends and Family: More than three times a week    Frequency of Social Gatherings with Friends and Family: More than three times a week    Attends Religious Services: Never    Database administrator or Organizations: No    Attends Banker Meetings: Never    Marital Status: Widowed  Intimate Partner Violence: Not At Risk (04/19/2021)   Humiliation, Afraid, Rape, and Kick questionnaire    Fear of Current or Ex-Partner: No    Emotionally Abused: No    Physically Abused: No    Sexually Abused: No    Family History  Problem Relation Age of Onset   Kidney disease Mother    Diabetes Mother    Alcohol abuse Father    Diabetes Sister    Breast cancer Sister    Diabetes Sister    Ovarian cancer Sister  58   Breast cancer Sister    Cancer Brother    Diabetes Daughter    Breast cancer Niece    Colon cancer Neg Hx    Colon polyps Neg Hx    Heart disease Neg Hx    Rectal cancer Neg Hx    Stomach cancer Neg Hx     Current Outpatient Medications on File Prior to Visit  Medication Sig Dispense Refill   acetaminophen (TYLENOL) 500 MG tablet Take 500 mg by mouth every 6 (six) hours as needed for mild pain.     amLODipine (NORVASC) 10 MG tablet Take 1 tablet (10 mg total) by mouth daily. 30 tablet 0   atorvastatin (LIPITOR) 40 MG tablet Take 1 tablet (40 mg total) by mouth daily. 90 tablet 3   cholecalciferol (VITAMIN D3) 25 MCG (1000 UNIT) tablet Take 1,000 Units by mouth  daily.     co-enzyme Q-10 30 MG capsule Take 30 mg by mouth daily.     letrozole (FEMARA) 2.5 MG tablet Take 1 tablet (2.5 mg total) by mouth daily. 90 tablet 3   omega-3 acid ethyl esters (LOVAZA) 1 g capsule Take 1 g by mouth 2 (two) times daily.     No current facility-administered medications on file prior to visit.    No Known Allergies     Physical Exam There were no vitals filed for this visit. Estimated body mass index is 33.11 kg/m as calculated from the following:   Height as of 05/19/22: 5\' 2"  (1.575 m).   Weight as of 05/19/22: 181 lb (82.1 kg).  EKG (optional): deferred due to virtual visit  GENERAL: alert, oriented, no acute distress detected, full vision exam deferred due to pandemic and/or virtual encounter  *** HEENT: atraumatic, conjunttiva clear, no obvious abnormalities on inspection of external nose and ears  NECK: normal movements of the head and neck  LUNGS: on inspection no signs of respiratory distress, breathing rate appears normal, no obvious gross SOB, gasping or wheezing  CV: no obvious cyanosis  MS: moves all visible extremities without noticeable abnormality  PSYCH/NEURO: pleasant and cooperative, no obvious depression or anxiety, speech and thought processing grossly intact, Cognitive function grossly intact        04/03/2022    3:41 PM 03/17/2022    3:56 PM 04/19/2021    9:46 AM 04/07/2021    2:13 PM 03/29/2021    8:30 AM  Depression screen PHQ 2/9  Decreased Interest 1 0 0 0 0  Down, Depressed, Hopeless 1 0 0 0 0  PHQ - 2 Score 2 0 0 0 0       08/27/2021   11:11 AM 11/06/2021   10:47 PM 03/17/2022    3:56 PM 04/03/2022    3:40 PM 05/17/2022    2:50 PM  Fall Risk  Falls in the past year?   0 1   Was there an injury with Fall?   0 0   Fall Risk Category Calculator   0 2   Fall Risk Category (Retired)   Low Moderate   (RETIRED) Patient Fall Risk Level Moderate fall risk Low fall risk Low fall risk High fall risk High fall risk  Patient at  Risk for Falls Due to   No Fall Risks Impaired mobility;Medication side effect;Impaired balance/gait   Fall risk Follow up   Falls evaluation completed Falls evaluation completed;Education provided;Falls prevention discussed      SUMMARY AND PLAN:  No diagnosis found.  Visit coding *** [id number] (  annual wellness visit -initial); G0439 (annual wellness subsequent); G0402 Welcome to Medicare(initial preventive physical exam)   Discussed applicable health maintenance/preventive health measures and advised and referred or ordered per patient preferences:  Health Maintenance  Topic Date Due   DTaP/Tdap/Td (1 - Tdap) Never done   DEXA SCAN  Never done   MAMMOGRAM  03/31/2024   Hepatitis C Screening  Completed   HPV VACCINES  Aged Out   Pneumonia Vaccine 78+ Years old  Discontinued   INFLUENZA VACCINE  Discontinued   COLONOSCOPY (Pts 45-66yrs Insurance coverage will need to be confirmed)  Discontinued   COVID-19 Vaccine  Discontinued   Zoster Vaccines- Shingrix  Discontinued     Vaccines   Mammogram   Screening Pap smear/pelvic exam   Lung Cancer Screening   Colorectal cancer screening   Osteoporosis screening if applicable  Screening for glaucoma  Statin use for primary prevention  Cardiovascular screening blood tests   Diabetes screening tests  Hepatitis B screening if applicable  HIV screening   Hepatitis C screening   Chlamydia/Gonorrhea screening  STI Screening   Syphilis Screening if applicable  Latent TB screening if applicable  Breast Cancer prevention medication if applicable  BRCA related risk assessment and referral for counseling if applicable  Education and counseling on the following was provided based on the above review of health and a plan/checklist for the patient, along with additional information discussed, was provided for the patient in the patient instructions :  -Advised on importance of completing advanced directives, discussed options  for completing and provided information in patient instructions as well -Provided counseling and plan for difficulty hearing  -Provided counseling and plan for increased risk of falling if applicable per above screening. Reviewed and demonstrated safe balance exercises that can be done at home to improve balance and discussed exercise guidelines for adults with include balance exercises at least 3 days per week.  -Advised and counseled on a healthy lifestyle - including the importance of a healthy diet, regular physical activity, social connections and stress management. -Reviewed patient's current diet. Advised and counseled on a whole foods based healthy diet. A summary of a healthy diet was provided in the Patient Instructions.  -reviewed patient's current physical activity level and discussed exercise guidelines for adults. Discussed community resources and ideas for safe exercise at home to assist in meeting exercise guideline recommendations in a safe and healthy way.  -Advise yearly dental visits at minimum and regular eye exams -Advised and counseled on alcohol safe limits, risks/ tobacco use, risks of smoking and offered counseling/help, drug, opoid use/misuse   Follow up: see patient instructions     There are no Patient Instructions on file for this visit.  Terressa Koyanagi, DO

## 2023-06-25 DIAGNOSIS — Z043 Encounter for examination and observation following other accident: Secondary | ICD-10-CM | POA: Diagnosis not present

## 2023-06-25 DIAGNOSIS — W1830XA Fall on same level, unspecified, initial encounter: Secondary | ICD-10-CM | POA: Diagnosis not present

## 2023-06-25 DIAGNOSIS — R4781 Slurred speech: Secondary | ICD-10-CM | POA: Diagnosis not present

## 2023-06-25 DIAGNOSIS — Z515 Encounter for palliative care: Secondary | ICD-10-CM | POA: Diagnosis not present

## 2023-06-25 DIAGNOSIS — I639 Cerebral infarction, unspecified: Secondary | ICD-10-CM | POA: Diagnosis not present

## 2023-06-25 DIAGNOSIS — R531 Weakness: Secondary | ICD-10-CM | POA: Diagnosis not present

## 2023-06-25 DIAGNOSIS — R590 Localized enlarged lymph nodes: Secondary | ICD-10-CM | POA: Diagnosis not present

## 2023-06-25 DIAGNOSIS — R29898 Other symptoms and signs involving the musculoskeletal system: Secondary | ICD-10-CM | POA: Diagnosis not present

## 2023-06-25 DIAGNOSIS — Z853 Personal history of malignant neoplasm of breast: Secondary | ICD-10-CM | POA: Diagnosis not present

## 2023-07-06 DIAGNOSIS — Z743 Need for continuous supervision: Secondary | ICD-10-CM | POA: Diagnosis not present

## 2023-07-06 DIAGNOSIS — I10 Essential (primary) hypertension: Secondary | ICD-10-CM | POA: Diagnosis not present

## 2023-09-14 ENCOUNTER — Emergency Department (HOSPITAL_COMMUNITY)

## 2023-09-14 ENCOUNTER — Other Ambulatory Visit: Payer: Self-pay

## 2023-09-14 ENCOUNTER — Inpatient Hospital Stay (HOSPITAL_COMMUNITY)
Admission: EM | Admit: 2023-09-14 | Discharge: 2023-09-16 | DRG: 682 | Disposition: A | Discharge status: Hospice | Attending: Internal Medicine | Admitting: Internal Medicine

## 2023-09-14 ENCOUNTER — Encounter (HOSPITAL_COMMUNITY): Payer: Self-pay

## 2023-09-14 DIAGNOSIS — N179 Acute kidney failure, unspecified: Secondary | ICD-10-CM | POA: Diagnosis not present

## 2023-09-14 DIAGNOSIS — I639 Cerebral infarction, unspecified: Secondary | ICD-10-CM | POA: Diagnosis not present

## 2023-09-14 DIAGNOSIS — I6529 Occlusion and stenosis of unspecified carotid artery: Secondary | ICD-10-CM | POA: Diagnosis not present

## 2023-09-14 DIAGNOSIS — C7951 Secondary malignant neoplasm of bone: Secondary | ICD-10-CM | POA: Diagnosis present

## 2023-09-14 DIAGNOSIS — Z515 Encounter for palliative care: Secondary | ICD-10-CM

## 2023-09-14 DIAGNOSIS — R531 Weakness: Secondary | ICD-10-CM | POA: Diagnosis not present

## 2023-09-14 DIAGNOSIS — Z9221 Personal history of antineoplastic chemotherapy: Secondary | ICD-10-CM

## 2023-09-14 DIAGNOSIS — R69 Illness, unspecified: Secondary | ICD-10-CM

## 2023-09-14 DIAGNOSIS — C50912 Malignant neoplasm of unspecified site of left female breast: Secondary | ICD-10-CM | POA: Diagnosis not present

## 2023-09-14 DIAGNOSIS — Z79891 Long term (current) use of opiate analgesic: Secondary | ICD-10-CM

## 2023-09-14 DIAGNOSIS — Z170 Estrogen receptor positive status [ER+]: Secondary | ICD-10-CM

## 2023-09-14 DIAGNOSIS — I10 Essential (primary) hypertension: Secondary | ICD-10-CM | POA: Diagnosis present

## 2023-09-14 DIAGNOSIS — G8190 Hemiplegia, unspecified affecting unspecified side: Secondary | ICD-10-CM | POA: Diagnosis not present

## 2023-09-14 DIAGNOSIS — L89151 Pressure ulcer of sacral region, stage 1: Secondary | ICD-10-CM | POA: Diagnosis present

## 2023-09-14 DIAGNOSIS — G9341 Metabolic encephalopathy: Secondary | ICD-10-CM | POA: Diagnosis present

## 2023-09-14 DIAGNOSIS — Z803 Family history of malignant neoplasm of breast: Secondary | ICD-10-CM

## 2023-09-14 DIAGNOSIS — Z923 Personal history of irradiation: Secondary | ICD-10-CM

## 2023-09-14 DIAGNOSIS — R4182 Altered mental status, unspecified: Secondary | ICD-10-CM

## 2023-09-14 DIAGNOSIS — Z809 Family history of malignant neoplasm, unspecified: Secondary | ICD-10-CM

## 2023-09-14 DIAGNOSIS — E119 Type 2 diabetes mellitus without complications: Secondary | ICD-10-CM | POA: Diagnosis present

## 2023-09-14 DIAGNOSIS — Z833 Family history of diabetes mellitus: Secondary | ICD-10-CM

## 2023-09-14 DIAGNOSIS — I700 Atherosclerosis of aorta: Secondary | ICD-10-CM | POA: Diagnosis not present

## 2023-09-14 DIAGNOSIS — Z811 Family history of alcohol abuse and dependence: Secondary | ICD-10-CM

## 2023-09-14 DIAGNOSIS — C50511 Malignant neoplasm of lower-outer quadrant of right female breast: Secondary | ICD-10-CM | POA: Diagnosis present

## 2023-09-14 DIAGNOSIS — Z79899 Other long term (current) drug therapy: Secondary | ICD-10-CM

## 2023-09-14 DIAGNOSIS — R918 Other nonspecific abnormal finding of lung field: Secondary | ICD-10-CM | POA: Diagnosis present

## 2023-09-14 DIAGNOSIS — G9340 Encephalopathy, unspecified: Secondary | ICD-10-CM | POA: Diagnosis present

## 2023-09-14 DIAGNOSIS — Z66 Do not resuscitate: Secondary | ICD-10-CM | POA: Diagnosis present

## 2023-09-14 DIAGNOSIS — E785 Hyperlipidemia, unspecified: Secondary | ICD-10-CM | POA: Diagnosis present

## 2023-09-14 DIAGNOSIS — E876 Hypokalemia: Secondary | ICD-10-CM | POA: Diagnosis present

## 2023-09-14 DIAGNOSIS — I672 Cerebral atherosclerosis: Secondary | ICD-10-CM | POA: Diagnosis not present

## 2023-09-14 DIAGNOSIS — R404 Transient alteration of awareness: Secondary | ICD-10-CM | POA: Diagnosis not present

## 2023-09-14 DIAGNOSIS — Z8041 Family history of malignant neoplasm of ovary: Secondary | ICD-10-CM

## 2023-09-14 DIAGNOSIS — R29810 Facial weakness: Secondary | ICD-10-CM | POA: Diagnosis not present

## 2023-09-14 DIAGNOSIS — I6782 Cerebral ischemia: Secondary | ICD-10-CM | POA: Diagnosis not present

## 2023-09-14 DIAGNOSIS — Z841 Family history of disorders of kidney and ureter: Secondary | ICD-10-CM

## 2023-09-14 DIAGNOSIS — R54 Age-related physical debility: Secondary | ICD-10-CM | POA: Diagnosis present

## 2023-09-14 LAB — COMPREHENSIVE METABOLIC PANEL
ALT: 12 U/L (ref 0–44)
AST: 23 U/L (ref 15–41)
Albumin: 3.4 g/dL — ABNORMAL LOW (ref 3.5–5.0)
Alkaline Phosphatase: 70 U/L (ref 38–126)
Anion gap: 14 (ref 5–15)
BUN: 20 mg/dL (ref 8–23)
CO2: 20 mmol/L — ABNORMAL LOW (ref 22–32)
Calcium: 10.5 mg/dL — ABNORMAL HIGH (ref 8.9–10.3)
Chloride: 103 mmol/L (ref 98–111)
Creatinine, Ser: 1.22 mg/dL — ABNORMAL HIGH (ref 0.44–1.00)
GFR, Estimated: 47 mL/min — ABNORMAL LOW (ref >60)
Glucose, Bld: 154 mg/dL — ABNORMAL HIGH (ref 70–99)
Potassium: 3.4 mmol/L — ABNORMAL LOW (ref 3.5–5.1)
Sodium: 137 mmol/L (ref 135–145)
Total Bilirubin: 0.8 mg/dL (ref 0.3–1.2)
Total Protein: 7 g/dL (ref 6.5–8.1)

## 2023-09-14 LAB — DIFFERENTIAL
Abs Immature Granulocytes: 0.07 10*3/uL (ref 0.00–0.07)
Basophils Absolute: 0.1 10*3/uL (ref 0.0–0.1)
Basophils Relative: 1 %
Eosinophils Absolute: 0.3 10*3/uL (ref 0.0–0.5)
Eosinophils Relative: 3 %
Immature Granulocytes: 1 %
Lymphocytes Relative: 18 %
Lymphs Abs: 2 10*3/uL (ref 0.7–4.0)
Monocytes Absolute: 0.5 10*3/uL (ref 0.1–1.0)
Monocytes Relative: 5 %
Neutro Abs: 8.3 10*3/uL — ABNORMAL HIGH (ref 1.7–7.7)
Neutrophils Relative %: 72 %

## 2023-09-14 LAB — I-STAT CHEM 8, ED
BUN: 24 mg/dL — ABNORMAL HIGH (ref 8–23)
Calcium, Ion: 1.05 mmol/L — ABNORMAL LOW (ref 1.15–1.40)
Chloride: 104 mmol/L (ref 98–111)
Creatinine, Ser: 1.1 mg/dL — ABNORMAL HIGH (ref 0.44–1.00)
Glucose, Bld: 156 mg/dL — ABNORMAL HIGH (ref 70–99)
HCT: 43 % (ref 36.0–46.0)
Hemoglobin: 14.6 g/dL (ref 12.0–15.0)
Potassium: 3.5 mmol/L (ref 3.5–5.1)
Sodium: 136 mmol/L (ref 135–145)
TCO2: 21 mmol/L — ABNORMAL LOW (ref 22–32)

## 2023-09-14 LAB — CBC
HCT: 43.3 % (ref 36.0–46.0)
Hemoglobin: 13.6 g/dL (ref 12.0–15.0)
MCH: 29.5 pg (ref 26.0–34.0)
MCHC: 31.4 g/dL (ref 30.0–36.0)
MCV: 93.9 fL (ref 80.0–100.0)
Platelets: 274 10*3/uL (ref 150–400)
RBC: 4.61 MIL/uL (ref 3.87–5.11)
RDW: 13.2 % (ref 11.5–15.5)
WBC: 11.3 10*3/uL — ABNORMAL HIGH (ref 4.0–10.5)
nRBC: 0 % (ref 0.0–0.2)

## 2023-09-14 LAB — ETHANOL: Alcohol, Ethyl (B): <10 mg/dL (ref <10)

## 2023-09-14 MED ORDER — LACTATED RINGERS IV SOLN: INTRAVENOUS | Status: AC

## 2023-09-14 MED ORDER — NALOXONE HCL 0.4 MG/ML IJ SOLN
0.4000 mg | Freq: Once | INTRAMUSCULAR | Status: AC
  Administered 2023-09-14: 0.4 mg via INTRAVENOUS

## 2023-09-14 MED ORDER — IOHEXOL 350 MG/ML SOLN
60.0000 mL | Freq: Once | INTRAVENOUS | Status: AC | PRN
  Administered 2023-09-14: 60 mL via INTRAVENOUS

## 2023-09-14 NOTE — ED Triage Notes (Signed)
Patient BIB [facility] from home as a code stroke. Patient caregiver noticed left sided weakness and facial droop. Patient also endorses aphasia. Baseline patient can hold a conversation and patient is unable to now. L sided breast cancer. Patient is hospice patient. Patient alert but confused.

## 2023-09-14 NOTE — Code Documentation (Signed)
Responded to Code Stroke called at 2026 for L sided weakness, L facial droop, and aphasia, LSN-1930. Pt arrived at 2042, CBG-156, NIH-10, CT head negative for acute changes, CTA-no LVO. D/t delayed responses, pt was given 0.4mg  narcan with no improvement. TNK-not given-family declined at this point based on symptoms. Pt remains in the TNK window until midnight. Q30 min VS/neuro checks until then.

## 2023-09-14 NOTE — ED Provider Notes (Signed)
EMERGENCY DEPARTMENT AT [facility] Provider Note   CSN: [id number] Arrival date & time: 09/14/23  2042  An emergency department physician performed an initial assessment on this suspected stroke patient at 2049.  History  Chief Complaint  Patient presents with   Code Stroke    Sheila Perez is a 77 y.o. female.  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  Patient had advanced disease and did not undergo surgical treatment and currently is in palliative care.  His daughter reports that about 2 weeks ago the patient had a fairly significant decline and went from being pretty independent and ambulatory to essentially nonambulatory.  She also wants her period of not eating and became poorly responsive.  However she then did start to eat again and has some improvement but not back to what had been her previous baseline.  Patient's daughter reports that the patient became much weaker and was requiring full assistance now to move out of her bed to a chair or to the bathroom.  She reports today she slumped over and became very poorly responsive.  EMS was called.  Patient arrived as code stroke.  EMS reported lateralizing left-sided symptoms but on arrival patient was more generally confused and had difficulty following commands.    Home Medications Prior to Admission medications   Medication Sig Start Date End Date Taking? Authorizing Provider  cholecalciferol (VITAMIN D3) 25 MCG (1000 UNIT) tablet Take 1,000 Units by mouth daily.   Yes [provider]  Morphine Sulfate (MORPHINE CONCENTRATE) 10 mg / 0.5 ml concentrated solution Take 20 mg by mouth. 08/28/23  Yes [provider]  OVER THE COUNTER MEDICATION Take 1 capsule by mouth 2 (two) times daily. [place] tail mushrooms   Yes [provider]  oxyCODONE (OXY IR/ROXICODONE) 5 MG immediate release tablet Take 5 mg by mouth every 4 (four) hours as needed. 08/06/23  Yes [provider]  sorbitol 70 % SOLN Take 30 mLs by mouth daily as needed for mild constipation or moderate constipation. 07/21/23  Yes [provider]  STIMULANT LAXATIVE 8.6-50 MG tablet Take 2 tablets by mouth 2 (two) times daily. 06/01/23  Yes [provider]      Allergies    Patient has no known allergies.    Review of Systems   Review of Systems  Physical Exam Updated Vital Signs BP (!) 175/104   Pulse 97   Temp 98.6 F (37 C) (Oral)   Resp (!) 25   Ht 5\' 2"  (1.575 m)   Wt 64 kg   SpO2 97%   BMI 25.81 kg/m  Physical Exam Constitutional:      Comments: No respiratory distress.  Patient seems confused or mildly somnolent.  Deconditioned appearance.  HENT:     Mouth/Throat:     Mouth: Mucous membranes are dry.     Pharynx: Oropharynx is clear.  Eyes:     Comments: Patient not following commands for extraocular motions.  Cardiovascular:     Rate and Rhythm: Normal rate and regular rhythm.  Pulmonary:     Effort: Pulmonary effort is normal.     Breath sounds: Normal breath sounds.     Comments: Patient has a large irregular fungating mass over the larger part of the left breast.  Approximately 12 cm diameter. Musculoskeletal:     Comments: No peripheral edema.  Calves are soft and pliable.  Skin:    General: Skin is warm and dry.  Neurological:     Comments: On arrival patient was not responding verbally.  Eyes were open but she seemed possibly somnolent or confused.  Not following commands for ocular motions.  Dr. Otelia Limes performed exam patient was holding both arms up when placed in upright position and then gradually drifting down but no appearance of asymmetric weakness or flaccid paralysis.  She would do some pushing and withdrawal with the lower extremities.     ED Results / Procedures / Treatments   Labs (all labs ordered are listed, but only abnormal results are displayed) Labs Reviewed  CBC - Abnormal; Notable for the following components:       Result Value   WBC 11.3 (*)    All other components within normal limits  DIFFERENTIAL - Abnormal; Notable for the following components:   Neutro Abs 8.3 (*)    All other components within normal limits  COMPREHENSIVE METABOLIC PANEL - Abnormal; Notable for the following components:   Potassium 3.4 (*)    CO2 20 (*)    Glucose, Bld 154 (*)    Creatinine, Ser 1.22 (*)    Calcium 10.5 (*)    Albumin 3.4 (*)    GFR, Estimated 47 (*)    All other components within normal limits  I-STAT CHEM 8, ED - Abnormal; Notable for the following components:   BUN 24 (*)    Creatinine, Ser 1.10 (*)    Glucose, Bld 156 (*)    Calcium, Ion 1.05 (*)    TCO2 21 (*)    All other components within normal limits  ETHANOL  RAPID URINE DRUG SCREEN, HOSP PERFORMED  URINALYSIS, ROUTINE W REFLEX MICROSCOPIC  PROTIME-INR  APTT    EKG EKG Interpretation Date/Time:  Friday September 14 2023 21:24:13 EDT Ventricular Rate:  107 PR Interval:  161 QRS Duration:  92 QT Interval:  356 QTC Calculation: 475 R Axis:   21  Text Interpretation: Sinus tachycardia RAE, consider biatrial enlargement Left ventricular hypertrophy Abnormal inferior Q waves increase rate, otherwisw no sig change fromprevious Confirmed by Arby Barrette [phone]) on 09/14/2023 11:49:54 PM  Radiology CT ANGIO HEAD NECK W WO CM (CODE STROKE)  Result Date: 09/14/2023 CLINICAL DATA:  Initial evaluation for neuro deficit, stroke suspected. No other relevant history is provided. EXAM: CT ANGIOGRAPHY HEAD AND NECK WITH AND WITHOUT CONTRAST TECHNIQUE: Multidetector CT imaging of the head and neck was performed using the standard protocol during bolus administration of intravenous contrast. Multiplanar CT image reconstructions and MIPs were obtained to evaluate the vascular anatomy. Carotid stenosis measurements (when applicable) are obtained utilizing NASCET criteria, using the distal internal carotid diameter as the denominator. RADIATION DOSE  REDUCTION: This exam was performed according to the departmental dose-optimization program which includes automated exposure control, adjustment of the mA and/or kV according to patient size and/or use of iterative reconstruction technique. CONTRAST:  60mL OMNIPAQUE IOHEXOL 350 MG/ML SOLN COMPARISON:  CT from earlier the same day. FINDINGS: CTA NECK FINDINGS Aortic arch: Visualized aortic arch within normal limits for caliber. Mild aortic atherosclerosis. Bovine branching pattern noted. No stenosis about the origin the great vessels. Right carotid system: Right common and internal carotid arteries are patent without stenosis or dissection. Left carotid system: Left common and internal carotid arteries are patent without stenosis or dissection. Vertebral arteries: Both vertebral arteries arise from subclavian arteries. No proximal subclavian artery stenosis. Right vertebral artery slightly dominant. Vertebral arteries are patent without stenosis or dissection. Skeleton: Few scattered lucent l lesions noted within the  visualized spine, most pronounced about the cervicothoracic junction at T2 (series 9, image 94). Findings concerning for metastatic disease. Moderate cervical spondylosis with superimposed OPLL at C3-4 through C5-6. Other neck: No other acute finding. Upper chest: Few scattered pulmonary nodules measuring up to 5-6 mm within the lungs, concerning for possible metastatic disease given the history of breast cancer. Asymmetric skin thickening noted about the partially visualized left breast. Review of the MIP images confirms the above findings CTA HEAD FINDINGS Anterior circulation: Mild atheromatous change about the carotid siphons without hemodynamically significant stenosis. 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 A1 segments patent bilaterally. Both ACAs patent without significant stenosis. No M1 stenosis or occlusion.  Diffuse small vessel atheromatous irregularity seen throughout the MCA branches bilaterally, with specific note of a severe proximal left M3 stenosis, inferior division (series 8, image 146). Attenuated but patent flow is seen distally. Posterior circulation: Both V4 segments patent without stenosis. Both PICA patent. Basilar patent without stenosis. Superior cerebellar and posterior cerebral arteries patent bilaterally. Venous sinuses: Not well assessed due to timing the contrast bolus. Anatomic variants: None significant. Review of the MIP images confirms the above findings IMPRESSION: 1. Negative CTA for large vessel occlusion or other emergent finding. 2. Diffuse small vessel atheromatous irregularity throughout the MCA branches bilaterally, with specific note of a severe proximal left M3 stenosis, inferior division. 3. 4 mm left PCOM aneurysm. 4. Few scattered lucent lesions within the visualized spine, most pronounced about the cervicothoracic junction at T2. Findings concerning for metastatic disease given the history of breast cancer. 5. Few scattered pulmonary nodules measuring up to 5-6 mm within the lungs, also concerning for possible metastatic disease. 6. Asymmetric skin thickening about the partially visualized left breast, presumably correlating with patient's known malignancy. Correlation with physical exam recommended. 7. Aortic Atherosclerosis (ICD10-I70.0). Preliminary results discussed by telephone at the time of interpretation on 09/14/2023 at 9:25 pm with provider ERIC LINDZEN. Electronically Signed   By: Rise Mu M.D.   On: 09/14/2023 21:44   CT HEAD CODE STROKE WO CONTRAST  Result Date: 09/14/2023 CLINICAL DATA:  Code stroke. Evaluation for neuro deficit, stroke suspected. EXAM: CT HEAD WITHOUT CONTRAST TECHNIQUE: Contiguous axial images were obtained from the base of the skull through the vertex without intravenous contrast. RADIATION DOSE REDUCTION: This exam was performed  according to the departmental dose-optimization program which includes automated exposure control, adjustment of the mA and/or kV according to patient size and/or use of iterative reconstruction technique. COMPARISON:  Prior study from 08/27/2021. FINDINGS: Brain: Cerebral volume within normal limits. Extensive patchy and confluent hypodensity involving the supratentorial cerebral white matter, consistent with chronic small vessel ischemic disease, advanced in nature. Superimposed remote lacunar infarcts present about the deep gray nuclei. Few small remote right cerebellar infarcts. No acute large vessel territory infarct. No acute intracranial hemorrhage. No mass lesion or midline shift. No hydrocephalus or extra-axial fluid collection. Vascular: No abnormal hyperdense vessel. Scattered vascular calcifications noted within the carotid siphons. Skull: Scalp soft tissues within normal limits.  Calvarium intact. Sinuses/Orbits: Globes and orbital soft tissues demonstrate no acute finding. Paranasal sinuses are largely clear. No significant mastoid effusion. Other: None. ASPECTS [facility] Stroke Program Early CT Score) - Ganglionic level infarction (caudate, lentiform nuclei, internal capsule, insula, M1-M3 cortex): 7 - Supraganglionic infarction (M4-M6 cortex): 3 Total score (0-10 with 10 being normal): 10 IMPRESSION: 1. No acute intracranial abnormality. 2. ASPECTS is 10. 3. Advanced  chronic microvascular ischemic disease, with a few small remote right cerebellar infarcts. These results were communicated to Dr. Otelia Limes at 9:04 pm on 09/14/2023 by text page via the [facility] messaging system. Electronically Signed   By: Rise Mu M.D.   On: 09/14/2023 21:06    Procedures Procedures   CRITICAL CARE Performed by: Arby Barrette   Total critical care time: 30 minutes  Critical care time was exclusive of separately billable procedures and treating other patients.  Critical care was necessary to treat or  prevent imminent or life-threatening deterioration.  Critical care was time spent personally by me on the following activities: development of treatment plan with patient and/or surrogate as well as nursing, discussions with consultants, evaluation of patient's response to treatment, examination of patient, obtaining history from patient or surrogate, ordering and performing treatments and interventions, ordering and review of laboratory studies, ordering and review of radiographic studies, pulse oximetry and re-evaluation of patient's condition.  Medications Ordered in ED Medications  iohexol (OMNIPAQUE) 350 MG/ML injection 60 mL (60 mLs Intravenous Contrast Given 09/14/23 2110)  naloxone [facility]) injection 0.4 mg (0.4 mg Intravenous Given 09/14/23 2113)    ED Course/ Medical Decision Making/ A&P                                 Medical Decision Making Amount and/or Complexity of Data Reviewed Labs: ordered. Radiology: ordered.  Risk Prescription drug management. Decision regarding hospitalization.   Patient presents as outlined.  I have discussed the patient's case with her daughter as well.  She arrived as code stroke.  There are no LVO occlusions.  Patient was not a thrombolytic candidate.  Dr. Otelia Limes had reviewed risks and benefits with the patient's daughter and she did decline.  Patient's daughter however reports that she would like to pursue treatments which would improve her mothers quality of life.  She does agree with proceeding with MRI and determining if there is significant metastatic disease and any potential mitigating palliative treatments available.  Patient did show some improvement over her time in the emergency department.  She went from an examination of being verbally nonresponsive before diagnostic studies.  After I reassessed her after CT and angiogram, she is making some situationally appropriate verbal responses.  She is denying any active pain at this time.  Her  exam remains nonfocal.  She still seems confused and ill but cognitively better than upon arrival.  Patient chemistry panel GFR 47 potassium 3.4 CBG 154 CBC white count of 11 normal H&H  At this time we will proceed with MRI order.  Will initiate IV fluids for rehydration.  Will plan for admission to medical service for ongoing diagnostic evaluation and symptomatic treatment.   Consults: [facility] hospitalist Dr. Arlean Hopping for admission.       Final Clinical Impression(s) / ED Diagnoses Final diagnoses:  Malignant neoplasm of left female breast, unspecified estrogen receptor status, unspecified site of breast (HCC)  Altered mental status, unspecified altered mental status type  Severe comorbid illness    Rx / DC Orders ED Discharge Orders     None         Arby Barrette, MD 09/14/23 2356    Arby Barrette, MD 09/15/23 [phone]

## 2023-09-14 NOTE — Consult Note (Signed)
NEURO HOSPITALIST CONSULT NOTE   Requestig physician: Dr. Donnald Garre  Reason for Consult: Acute onset of left sided weakness with left sided facial droop and mutism  History obtained from:  EMS and Chart     HPI:                                                                                                                                          Sheila Perez is an 77 y.o. female hospice patient with a PMHx of bilateral breast cancer diagnosed in 2023, DM, gait abnormality, HLD, HTN and prior stroke who presented via EMS to the ED on Friday night as a Code Stroke after her caregiver noticed new onset of left sided weakness, facial droop and difficulty with speech. At baseline she can hold a conversation normally but is unable to now due to hypophonia, dysarthria and slowed speech. She was alert on arrival, but confused. LKN 1930.   Home medications include morphine, oxycodone and tramadol. She is not on a blood thinner or an antiplatelet agent.   Past Medical History:  Diagnosis Date   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   Blood transfusion without reported diagnosis    Diabetes mellitus without complication (HCC)    prediabetic-no medications   Gait abnormality    Hyperlipidemia    Hypertension    Neuromuscular disorder (HCC)    never damage for the way she walks   Stroke [facility])    mimi    Past Surgical History:  Procedure Laterality Date   ABDOMINAL HYSTERECTOMY     maxilo facial surgery      Family History  Problem Relation Age of Onset   Kidney disease Mother    Diabetes Mother    Alcohol abuse Father    Diabetes Sister    Breast cancer Sister    Diabetes Sister    Ovarian cancer Sister 60   Breast cancer Sister    Cancer Brother    Diabetes Daughter    Breast cancer Niece    Colon cancer Neg Hx    Colon polyps Neg Hx    Heart disease Neg Hx    Rectal cancer Neg Hx    Stomach cancer Neg Hx               Social History:  reports that she has  never smoked. She has never used smokeless tobacco. She reports that she does not drink alcohol and does not use drugs.  No Known Allergies  MEDICATIONS:  No current facility-administered medications on file prior to encounter.   Current Outpatient Medications on File Prior to Encounter  Medication Sig Dispense Refill   cholecalciferol (VITAMIN D3) 25 MCG (1000 UNIT) tablet Take 1,000 Units by mouth daily.     Morphine Sulfate (MORPHINE CONCENTRATE) 10 mg / 0.5 ml concentrated solution Take 20 mg by mouth.     OVER THE COUNTER MEDICATION Take 1 capsule by mouth 2 (two) times daily. [place] tail mushrooms     oxyCODONE (OXY IR/ROXICODONE) 5 MG immediate release tablet Take 5 mg by mouth every 4 (four) hours as needed.     sorbitol 70 % SOLN Take 30 mLs by mouth daily as needed for mild constipation or moderate constipation.     STIMULANT LAXATIVE 8.6-50 MG tablet Take 2 tablets by mouth 2 (two) times daily.     traMADol (ULTRAM) 50 MG tablet Take 50 mg by mouth every 8 (eight) hours as needed.     amLODipine (NORVASC) 10 MG tablet Take 1 tablet (10 mg total) by mouth daily. 30 tablet 0   atorvastatin (LIPITOR) 40 MG tablet Take 1 tablet (40 mg total) by mouth daily. 90 tablet 3   omega-3 acid ethyl esters (LOVAZA) 1 g capsule Take 1 g by mouth 2 (two) times daily.      ROS:                                                                                                                                       The patient endorses a 2/10 headache. She has no other complaints, not giving an answer when asked if she feels weaker on one side than the other. Unable to obtain a comprehensive ROS due to bradyphrenia.   BP (!) 168/96   Pulse 94   Temp 98.6 F (37 C) (Oral)   Resp 20   Ht 5\' 2"  (1.575 m)   Wt 64 kg   SpO2 97%   BMI 25.81 kg/m     General Examination:                                                                                                        Physical Exam  HEENT-  [place]/AT    Lungs- Respirations unlabored Extremities- No edema  Neurological Examination Mental Status: Awake with decreased alertness and increased latencies of motor and verbal responses; overall pattern is suggestive of possible sedation versus mild encephalopathy. Oriented to self, age, day of the week, presence  in the hospital, city and state, but not the month or the year. Speech is slow and dysarthric with 1-2 word answers and short phrases only. Able to name common objects. Able to follow all simple commands. Speech is dysarthric.  Cranial Nerves: II: Temporal visual fields intact with no extinction to DSS. PERRL  III,IV, VI: No ptosis. EOMI.  V: Temp sensation equal bilaterally  VII: Left facial droop VIII: Hearing intact to voice IX,X: Hypophonic speech XI: Head is midline.  XII: Tongue deviates subtly to the left.  Motor: RUE bradykinetic with 5/5 strength proximally and distally LUE bradykinetic with 4/5 strength proximally and distally  RIGHT upper extremity drifts slightly when testing Barre.  Unable to keep right or left lower extremity elevated after passive elevation and release, falling to bed immediately with minimal effort against gravity. Withdraws BLE slowly and grossly equally to noxious plantar stimulation, flexing about 60 degrees at hips and knees.  Sensory: Temp and light touch intact throughout, bilaterally. No extinction to DSS.  Deep Tendon Reflexes: 3+ and symmetric bilateral brachioradialis, biceps and patellars.  Cerebellar: No ataxia with FNF bilaterally, but slow  Gait: Deferred  NIHSS: 10.    Lab Results: Basic Metabolic Panel: No results for input(s): "NA", "K", "CL", "CO2", "GLUCOSE", "BUN", "CREATININE", "CALCIUM", "MG", "PHOS" in the last 168 hours.  CBC: No results for input(s): "WBC", "NEUTROABS", "HGB", "HCT", "MCV", "PLT" in the  last 168 hours.  Cardiac Enzymes: No results for input(s): "CKTOTAL", "CKMB", "CKMBINDEX", "TROPONINI" in the last 168 hours.  Lipid Panel: No results for input(s): "CHOL", "TRIG", "HDL", "CHOLHDL", "VLDL", "LDLCALC" in the last 168 hours.  Imaging: No results found.   Assessment: - Exam reveals bradyphrenia, bradykinesia, dysarthria, hypophonic and sparse speech, left facial droop and LUE weakness.  - CT head: No acute intracranial abnormality. ASPECTS is 10. Advanced chronic microvascular ischemic disease, with a few small remote right cerebellar infarcts. - CTA of head and neck: Negative CTA for large vessel occlusion or other emergent finding. Diffuse small vessel atheromatous irregularity throughout the MCA branches bilaterally, with specific note of a severe proximal left M3 stenosis, inferior division. 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 Aortic atherosclerosis. - EKG: Sinus tachycardia; RAE, consider biatrial enlargement; Left ventricular hypertrophy; Abnormal inferior Q waves - Labs: - Mild hypokalemia - Glucose 154 - Na normal - Elevated Ca of 10.5 - AST and ALT are normal. Total bilirubin normal - BUN 20 and Cr 1.22 with eGFR 47 - WBC 11.3 - DDx includes acute lacunar infarct involving the right thalamus or basal ganglia, versus oversedation secondary to her opiate medications with possible unmasking of a latent neurological deficit from an old lacune or the extensive chronic small vessel ischemic changes seen on CT.  - The patient has no absolute contraindications to TNK. Discussed extensively the risks/benefits of TNK treatment vs. no treatment with the patient's daughter, including risks of hemorrhage and death with IV thrombolytic administration versus worse overall outcomes on average in patients within the thrombolysis time window who are not administered TNK. Although clinical trials results imply possibly a greater benefit than risk, given possibility of oversedation  contributing to her presentation and no definite lesional aphasia, as well as her cancer diagnosis and prior decision by family not to aggressively pursue medical care for her cancer diagnosis, the overall benefits of TNK regarding long-term prognosis relative to the risks of hemorrhagic complications are uncertain. The patient's daughter after full discussion of the above made the decision not to  consent to TNK. All questions answered.   Recommendations: 1. HgbA1c, fasting lipid panel 2. MRI of the brain without contrast 3. PT consult, OT consult, Speech consult 4. Echocardiogram 5. Continue atorvastatin 6. Start ASA 81 mg po qd  7. Risk factor modification 8. Telemetry monitoring 9. Frequent neuro checks 10. NPO until passes stroke swallow screen 11. Management of her hypercalcemia per primary team.  12. Modified permissive HTN protocol x 24 hours given her age and frailty. Treat SBP if > 180.    Addendum:  - Also noted on CTA are a few scattered lucent lesions within the visualized spine, most pronounced about the cervicothoracic junction at T2. Findings concerning for metastatic disease given the history of breast cancer. Few scattered pulmonary nodules measuring up to 5-6 mm within the lungs, also concerning for possible metastatic disease.  Asymmetric skin thickening about the partially visualized left breast, presumably correlating with patient's known malignancy.  - Pain management per primary team.  - May need pill counts of her opiates to assess for possible opiate over-use given that oversedation is also on the DDx for her presentation - She is a hospice patient.    Electronically signed: Dr. Caryl Pina 09/14/2023, 8:55 PM

## 2023-09-15 ENCOUNTER — Encounter (HOSPITAL_COMMUNITY): Payer: Self-pay | Admitting: Internal Medicine

## 2023-09-15 ENCOUNTER — Emergency Department (HOSPITAL_COMMUNITY)

## 2023-09-15 DIAGNOSIS — N179 Acute kidney failure, unspecified: Secondary | ICD-10-CM | POA: Diagnosis not present

## 2023-09-15 DIAGNOSIS — Z923 Personal history of irradiation: Secondary | ICD-10-CM | POA: Diagnosis not present

## 2023-09-15 DIAGNOSIS — Z170 Estrogen receptor positive status [ER+]: Secondary | ICD-10-CM

## 2023-09-15 DIAGNOSIS — Z809 Family history of malignant neoplasm, unspecified: Secondary | ICD-10-CM | POA: Diagnosis not present

## 2023-09-15 DIAGNOSIS — G9340 Encephalopathy, unspecified: Secondary | ICD-10-CM | POA: Diagnosis present

## 2023-09-15 DIAGNOSIS — Z841 Family history of disorders of kidney and ureter: Secondary | ICD-10-CM | POA: Diagnosis not present

## 2023-09-15 DIAGNOSIS — C50511 Malignant neoplasm of lower-outer quadrant of right female breast: Secondary | ICD-10-CM

## 2023-09-15 DIAGNOSIS — Z8041 Family history of malignant neoplasm of ovary: Secondary | ICD-10-CM | POA: Diagnosis not present

## 2023-09-15 DIAGNOSIS — E785 Hyperlipidemia, unspecified: Secondary | ICD-10-CM | POA: Diagnosis present

## 2023-09-15 DIAGNOSIS — R54 Age-related physical debility: Secondary | ICD-10-CM | POA: Diagnosis present

## 2023-09-15 DIAGNOSIS — Z66 Do not resuscitate: Secondary | ICD-10-CM | POA: Diagnosis present

## 2023-09-15 DIAGNOSIS — I6782 Cerebral ischemia: Secondary | ICD-10-CM | POA: Diagnosis not present

## 2023-09-15 DIAGNOSIS — E119 Type 2 diabetes mellitus without complications: Secondary | ICD-10-CM | POA: Diagnosis present

## 2023-09-15 DIAGNOSIS — Z515 Encounter for palliative care: Secondary | ICD-10-CM | POA: Diagnosis not present

## 2023-09-15 DIAGNOSIS — C7951 Secondary malignant neoplasm of bone: Secondary | ICD-10-CM | POA: Diagnosis present

## 2023-09-15 DIAGNOSIS — Z79891 Long term (current) use of opiate analgesic: Secondary | ICD-10-CM | POA: Diagnosis not present

## 2023-09-15 DIAGNOSIS — E876 Hypokalemia: Secondary | ICD-10-CM

## 2023-09-15 DIAGNOSIS — R29818 Other symptoms and signs involving the nervous system: Secondary | ICD-10-CM | POA: Diagnosis not present

## 2023-09-15 DIAGNOSIS — Z811 Family history of alcohol abuse and dependence: Secondary | ICD-10-CM | POA: Diagnosis not present

## 2023-09-15 DIAGNOSIS — Z79899 Other long term (current) drug therapy: Secondary | ICD-10-CM | POA: Diagnosis not present

## 2023-09-15 DIAGNOSIS — Z833 Family history of diabetes mellitus: Secondary | ICD-10-CM | POA: Diagnosis not present

## 2023-09-15 DIAGNOSIS — G9341 Metabolic encephalopathy: Secondary | ICD-10-CM | POA: Diagnosis present

## 2023-09-15 DIAGNOSIS — C50912 Malignant neoplasm of unspecified site of left female breast: Secondary | ICD-10-CM | POA: Diagnosis present

## 2023-09-15 DIAGNOSIS — L89151 Pressure ulcer of sacral region, stage 1: Secondary | ICD-10-CM | POA: Diagnosis present

## 2023-09-15 DIAGNOSIS — Z803 Family history of malignant neoplasm of breast: Secondary | ICD-10-CM | POA: Diagnosis not present

## 2023-09-15 DIAGNOSIS — I10 Essential (primary) hypertension: Secondary | ICD-10-CM | POA: Diagnosis present

## 2023-09-15 DIAGNOSIS — Z9221 Personal history of antineoplastic chemotherapy: Secondary | ICD-10-CM | POA: Diagnosis not present

## 2023-09-15 LAB — CK: Total CK: 84 U/L (ref 38–234)

## 2023-09-15 LAB — CBC WITH DIFFERENTIAL/PLATELET
Abs Immature Granulocytes: 0.06 10*3/uL (ref 0.00–0.07)
Basophils Absolute: 0 10*3/uL (ref 0.0–0.1)
Basophils Relative: 0 %
Eosinophils Absolute: 0.1 10*3/uL (ref 0.0–0.5)
Eosinophils Relative: 1 %
HCT: 43.1 % (ref 36.0–46.0)
Hemoglobin: 14 g/dL (ref 12.0–15.0)
Immature Granulocytes: 1 %
Lymphocytes Relative: 20 %
Lymphs Abs: 2.3 10*3/uL (ref 0.7–4.0)
MCH: 29.7 pg (ref 26.0–34.0)
MCHC: 32.5 g/dL (ref 30.0–36.0)
MCV: 91.5 fL (ref 80.0–100.0)
Monocytes Absolute: 0.8 10*3/uL (ref 0.1–1.0)
Monocytes Relative: 7 %
Neutro Abs: 8.5 10*3/uL — ABNORMAL HIGH (ref 1.7–7.7)
Neutrophils Relative %: 71 %
Platelets: 305 10*3/uL (ref 150–400)
RBC: 4.71 MIL/uL (ref 3.87–5.11)
RDW: 13.2 % (ref 11.5–15.5)
WBC: 11.8 10*3/uL — ABNORMAL HIGH (ref 4.0–10.5)
nRBC: 0 % (ref 0.0–0.2)

## 2023-09-15 LAB — COMPREHENSIVE METABOLIC PANEL
ALT: 14 U/L (ref 0–44)
AST: 23 U/L (ref 15–41)
Albumin: 3.4 g/dL — ABNORMAL LOW (ref 3.5–5.0)
Alkaline Phosphatase: 77 U/L (ref 38–126)
Anion gap: 10 (ref 5–15)
BUN: 18 mg/dL (ref 8–23)
CO2: 25 mmol/L (ref 22–32)
Calcium: 10.4 mg/dL — ABNORMAL HIGH (ref 8.9–10.3)
Chloride: 100 mmol/L (ref 98–111)
Creatinine, Ser: 0.97 mg/dL (ref 0.44–1.00)
GFR, Estimated: >60 mL/min (ref >60)
Glucose, Bld: 93 mg/dL (ref 70–99)
Potassium: 4.3 mmol/L (ref 3.5–5.1)
Sodium: 135 mmol/L (ref 135–145)
Total Bilirubin: 0.7 mg/dL (ref 0.3–1.2)
Total Protein: 7.4 g/dL (ref 6.5–8.1)

## 2023-09-15 LAB — PHOSPHORUS: Phosphorus: 5 mg/dL — ABNORMAL HIGH (ref 2.5–4.6)

## 2023-09-15 LAB — TSH: TSH: 1.489 u[IU]/mL (ref 0.350–4.500)

## 2023-09-15 LAB — AMMONIA: Ammonia: 31 umol/L (ref 9–35)

## 2023-09-15 LAB — URINALYSIS, ROUTINE W REFLEX MICROSCOPIC
Bilirubin Urine: NEGATIVE
Glucose, UA: NEGATIVE mg/dL
Hgb urine dipstick: NEGATIVE
Ketones, ur: NEGATIVE mg/dL
Leukocytes,Ua: NEGATIVE
Nitrite: NEGATIVE
Protein, ur: NEGATIVE mg/dL
Specific Gravity, Urine: 1.036 — ABNORMAL HIGH (ref 1.005–1.030)
pH: 5 (ref 5.0–8.0)

## 2023-09-15 LAB — LIPID PANEL
Cholesterol: 230 mg/dL — ABNORMAL HIGH (ref 0–200)
HDL: 34 mg/dL — ABNORMAL LOW (ref >40)
LDL Cholesterol: 178 mg/dL — ABNORMAL HIGH (ref 0–99)
Total CHOL/HDL Ratio: 6.8 {ratio}
Triglycerides: 89 mg/dL (ref <150)
VLDL: 18 mg/dL (ref 0–40)

## 2023-09-15 LAB — HEMOGLOBIN A1C
Hgb A1c MFr Bld: 5.8 % — ABNORMAL HIGH (ref 4.8–5.6)
Mean Plasma Glucose: 119.76 mg/dL

## 2023-09-15 LAB — PROTIME-INR
INR: 1.1 (ref 0.8–1.2)
Prothrombin Time: 14.5 s (ref 11.4–15.2)

## 2023-09-15 LAB — MAGNESIUM
Magnesium: 1.9 mg/dL (ref 1.7–2.4)
Magnesium: 1.9 mg/dL (ref 1.7–2.4)

## 2023-09-15 LAB — APTT: aPTT: 29 s (ref 24–36)

## 2023-09-15 LAB — VITAMIN B12: Vitamin B-12: 1687 pg/mL — ABNORMAL HIGH (ref 180–914)

## 2023-09-15 LAB — CREATININE, URINE, RANDOM: Creatinine, Urine: 87 mg/dL

## 2023-09-15 LAB — SODIUM, URINE, RANDOM: Sodium, Ur: 16 mmol/L

## 2023-09-15 MED ORDER — MORPHINE SULFATE (PF) 2 MG/ML IV SOLN: 4.0000 mg | INTRAVENOUS | Status: DC | PRN

## 2023-09-15 MED ORDER — ACETAMINOPHEN 325 MG PO TABS: 650.0000 mg | ORAL_TABLET | Freq: Four times a day (QID) | ORAL | Status: DC | PRN

## 2023-09-15 MED ORDER — LACTATED RINGERS IV SOLN: INTRAVENOUS | Status: AC

## 2023-09-15 MED ORDER — HYDRALAZINE HCL 20 MG/ML IJ SOLN
10.0000 mg | INTRAMUSCULAR | Status: DC | PRN
  Administered 2023-09-15: 10 mg via INTRAVENOUS
  Filled 2023-09-15: qty 1

## 2023-09-15 MED ORDER — ONDANSETRON HCL 4 MG/2ML IJ SOLN: 4.0000 mg | Freq: Four times a day (QID) | INTRAMUSCULAR | Status: DC | PRN

## 2023-09-15 MED ORDER — POTASSIUM CHLORIDE 10 MEQ/100ML IV SOLN
10.0000 meq | INTRAVENOUS | Status: AC
  Administered 2023-09-15 (×4): 10 meq via INTRAVENOUS
  Filled 2023-09-15 (×4): qty 100

## 2023-09-15 MED ORDER — ATORVASTATIN CALCIUM 80 MG PO TABS
80.0000 mg | ORAL_TABLET | Freq: Every day | ORAL | Status: DC
  Administered 2023-09-15 – 2023-09-16 (×2): 80 mg via ORAL
  Filled 2023-09-15 (×2): qty 1

## 2023-09-15 MED ORDER — ACETAMINOPHEN 650 MG RE SUPP: 650.0000 mg | Freq: Four times a day (QID) | RECTAL | Status: DC | PRN

## 2023-09-15 MED ORDER — ASPIRIN 81 MG PO TBEC
81.0000 mg | DELAYED_RELEASE_TABLET | Freq: Every day | ORAL | Status: DC
  Administered 2023-09-15 – 2023-09-16 (×2): 81 mg via ORAL
  Filled 2023-09-15 (×2): qty 1

## 2023-09-15 MED ORDER — MELATONIN 3 MG PO TABS: 3.0000 mg | ORAL_TABLET | Freq: Every evening | ORAL | Status: DC | PRN

## 2023-09-15 MED ORDER — METOPROLOL TARTRATE 5 MG/5ML IV SOLN: 5.0000 mg | INTRAVENOUS | Status: DC | PRN

## 2023-09-15 MED ORDER — SENNOSIDES-DOCUSATE SODIUM 8.6-50 MG PO TABS: 1.0000 | ORAL_TABLET | Freq: Every evening | ORAL | Status: DC | PRN

## 2023-09-15 MED ORDER — IPRATROPIUM-ALBUTEROL 0.5-2.5 (3) MG/3ML IN SOLN: 3.0000 mL | RESPIRATORY_TRACT | Status: DC | PRN

## 2023-09-15 MED ORDER — NALOXONE HCL 0.4 MG/ML IJ SOLN: 0.4000 mg | INTRAMUSCULAR | Status: DC | PRN

## 2023-09-15 MED ORDER — GUAIFENESIN 100 MG/5ML PO LIQD: 5.0000 mL | ORAL | Status: DC | PRN

## 2023-09-15 NOTE — Progress Notes (Signed)
PROGRESS NOTE    Sheila Perez  WUJ:[id number] DOB: Mar 19, 1946 DOA: 09/14/2023 PCP: [facility]   Brief Narrative:  77 year old with history of metastatic breast cancer no longer on treatment on outpatient hospice with [facility], HTN, HLD brought to the hospital for acute encephalopathy which has been fluctuating over past 2 weeks.  CT head, CTA head and neck are negative for acute pathology.  MRI brain is negative for acute pathology but shows chronic advance microvascular ischemic changes, chronic microhemorrhages consistent with possible some sort of angiopathy, 1.1 cm left frontal calvarium lesion possibly osseous metastases.  I have requested TOC to reengage her outpatient hospice team.   Assessment & Plan:  Principal Problem:   Acute encephalopathy Active Problems:   Malignant neoplasm of lower-outer quadrant of right breast of female, estrogen receptor positive (HCC)   Hypokalemia   Hypercalcemia   AKI (acute kidney injury) (HCC)    Acute encephalopathy - No acute ischemic changes seen on the CT head, MRI of the brain but does show significant chronic changes with also likely osseous metastatic disease.  Patient has been active with outpatient hospice, will reach out to [facility] to contact their service.  Neurology was consulted by the admitting provider.  TSH, B12 normal.  Will check UA, ammonia as daughter still desires to treat any treatable condition.  Hypokalemia/hypocalcemia - Monitor and replete electrolytes as needed  Mild renal insufficiency/AKI - Admission creatinine 1.22, baseline 0.9.  Gentle fluids.  Breast cancer with metastatic disease - Was diagnosed in 2023.  No longer any acute treatment.  Active with outpatient hospice service.  Will get them involved again.  Pain control.  History of HTN/HLD - Does not appear to be any meds.  IV as needed.  DVT prophylaxis: SCDs Start: 09/15/23 0045 Code Status: DNR Family Communication:  daughter at  bedside Status is: Inpatient  On going management for encephalopathy, weakness.  Her outpatient hospice reconsulted   Subjective: Patient seen at bedside.  Does not have any complaints.  Overall currently at baseline per daughter who was there but her concern is patient has significantly declined over the last 2 months in terms of mobility and mentally over the last couple of days.   Examination:  General exam: Appears calm and comfortable.  Chronically ill and frail Respiratory system: Clear to auscultation. Respiratory effort normal. Cardiovascular system: S1 & S2 heard, RRR. No JVD, murmurs, rubs, gallops or clicks. No pedal edema. Gastrointestinal system: Abdomen is nondistended, soft and nontender. No organomegaly or masses felt. Normal bowel sounds heard. Central nervous system: Alert and oriented to name and place. No focal neurological deficits. Extremities: Symmetric 4 x 5 power. Skin: No rashes, lesions or ulcers Psychiatry: Judgement and insight appear poor  Pressure Injury 09/15/23 Sacrum Medial Stage 1 -  Intact skin with non-blanchable redness of a localized area usually over a bony prominence. (Active)  09/15/23 0306  Location: Sacrum  Location Orientation: Medial  Staging: Stage 1 -  Intact skin with non-blanchable redness of a localized area usually over a bony prominence.  Wound Description (Comments):   Present on Admission: Yes     Diet Orders (From admission, onward)     Start     Ordered   09/15/23 0046  Diet regular Room service appropriate? Yes; Fluid consistency: Thin  Diet effective now       Question Answer Comment  Room service appropriate? Yes   Fluid consistency: Thin      09/15/23 0045  Objective: Vitals:   09/15/23 0045 09/15/23 0128 09/15/23 0221 09/15/23 0750  BP: (!) 167/111  (!) 168/93 (!) 191/96  Pulse: 91  94 90  Resp: (!) 21  (!) 24 17  Temp:  98.9 F (37.2 C) 99.6 F (37.6 C) 98.4 F (36.9 C)  TempSrc:  Oral  Oral Oral  SpO2: 98%  100% 100%  Weight:      Height:        Intake/Output Summary (Last 24 hours) at 09/15/2023 0815 Last data filed at 09/15/2023 0518 Gross per 24 hour  Intake 1074.23 ml  Output --  Net 1074.23 ml   Filed Weights   09/14/23 2000  Weight: 64 kg    Scheduled Meds: Continuous Infusions:  lactated ringers 125 mL/hr at 09/15/23 1610    Nutritional status     Body mass index is 25.81 kg/m.  Data Reviewed:   CBC: Recent Labs  Lab 09/14/23 2054 09/14/23 2100 09/15/23 0555  WBC  --  11.3* 11.8*  NEUTROABS  --  8.3* 8.5*  HGB 14.6 13.6 14.0  HCT 43.0 43.3 43.1  MCV  --  93.9 91.5  PLT  --  274 305   Basic Metabolic Panel: Recent Labs  Lab 09/14/23 2054 09/14/23 2100 09/15/23 0555  NA 136 137 135  K 3.5 3.4* 4.3  CL 104 103 100  CO2  --  20* 25  GLUCOSE 156* 154* 93  BUN 24* 20 18  CREATININE 1.10* 1.22* 0.97  CALCIUM  --  10.5* 10.4*  MG  --  1.9 1.9  PHOS  --   --  5.0*   GFR: Estimated Creatinine Clearance: 44.7 mL/min (by C-G formula based on SCr of 0.97 mg/dL). Liver Function Tests: Recent Labs  Lab 09/14/23 2100 09/15/23 0555  AST 23 23  ALT 12 14  ALKPHOS 70 77  BILITOT 0.8 0.7  PROT 7.0 7.4  ALBUMIN 3.4* 3.4*   No results for input(s): "LIPASE", "AMYLASE" in the last 168 hours. No results for input(s): "AMMONIA" in the last 168 hours. Coagulation Profile: Recent Labs  Lab 09/15/23 0555  INR 1.1   Cardiac Enzymes: Recent Labs  Lab 09/15/23 0555  CKTOTAL 84   BNP (last 3 results) No results for input(s): "PROBNP" in the last 8760 hours. HbA1C: No results for input(s): "HGBA1C" in the last 72 hours. CBG: No results for input(s): "GLUCAP" in the last 168 hours. Lipid Profile: Recent Labs    09/15/23 0555  CHOL 230*  HDL 34*  LDLCALC 178*  TRIG 89  CHOLHDL 6.8   Thyroid Function Tests: Recent Labs    09/15/23 0555  TSH 1.489   Anemia Panel: Recent Labs    09/15/23 0555  VITAMINB12 1,687*    Sepsis Labs: No results for input(s): "PROCALCITON", "LATICACIDVEN" in the last 168 hours.  No results found for this or any previous visit (from the past 240 hour(s)).       Radiology Studies: MR Brain Wo Contrast (neuro protocol)  Result Date: 09/15/2023 CLINICAL DATA:  Initial evaluation for neuro deficit, stroke suspected. EXAM: MRI HEAD WITHOUT CONTRAST TECHNIQUE: Multiplanar, multiecho pulse sequences of the brain and surrounding structures were obtained without intravenous contrast. COMPARISON:  Prior CTs from 09/14/2023. FINDINGS: Brain: Cerebral volume within normal limits for age. Confluent T2/FLAIR hyperintensity involving the periventricular and deep white matter both cerebral hemispheres, consistent with chronic small vessel ischemic disease, fairly advanced in nature. Patchy involvement of the pons noted as well. Multiple scattered remote lacunar infarcts  present about the bilateral basal ganglia, thalami, and pons. Small remote right cerebellar infarcts. No abnormal foci of restricted diffusion to suggest acute or subacute ischemia. Gray-white matter differentiation maintained. No other areas of chronic cortical infarction. No acute intracranial hemorrhage. Innumerable chronic micro hemorrhages are seen involving both cerebral and cerebellar hemispheres, with additional involvement of the brainstem. Findings are nonspecific. No mass lesion or midline shift. No hydrocephalus or extra-axial fluid collection. Pituitary gland and suprasellar region within normal limits. Vascular: Major intracranial vascular flow voids are maintained. Skull and upper cervical spine: Craniocervical junction within normal limits. Heterogeneous signal intensity seen throughout the visualized bone marrow. 1.1 cm lesion present at the left frontal calvarium, nonspecific, but could potentially reflect an osseous metastasis given findings in the spine seen on prior CTA. No scalp soft tissue abnormality.  Sinuses/Orbits: Globes and orbital soft tissues within normal limits. Paranasal sinuses are largely clear. No mastoid effusion. Other: None. IMPRESSION: 1. No acute intracranial abnormality. 2. Advanced chronic microvascular ischemic disease with multiple scattered remote lacunar infarcts involving the bilateral basal ganglia, thalami, and pons. 3. Innumerable chronic micro hemorrhages throughout the brain. Findings are nonspecific, and could reflect changes of cerebral amyloid angiopathy or chronic poorly controlled hypertension. 4. 1.1 cm lesion at the left frontal calvarium, nonspecific, but could potentially reflect an osseous metastasis given previously seen findings in the spine on prior CTA. Attention at follow-up recommended. Electronically Signed   By: Rise Mu M.D.   On: 09/15/2023 01:51   CT ANGIO HEAD NECK W WO CM (CODE STROKE)  Result Date: 09/14/2023 CLINICAL DATA:  Initial evaluation for neuro deficit, stroke suspected. No other relevant history is provided. EXAM: CT ANGIOGRAPHY HEAD AND NECK WITH AND WITHOUT CONTRAST TECHNIQUE: Multidetector CT imaging of the head and neck was performed using the standard protocol during bolus administration of intravenous contrast. Multiplanar CT image reconstructions and MIPs were obtained to evaluate the vascular anatomy. Carotid stenosis measurements (when applicable) are obtained utilizing NASCET criteria, using the distal internal carotid diameter as the denominator. RADIATION DOSE REDUCTION: This exam was performed according to the departmental dose-optimization program which includes automated exposure control, adjustment of the mA and/or kV according to patient size and/or use of iterative reconstruction technique. CONTRAST:  60mL OMNIPAQUE IOHEXOL 350 MG/ML SOLN COMPARISON:  CT from earlier the same day. FINDINGS: CTA NECK FINDINGS Aortic arch: Visualized aortic arch within normal limits for caliber. Mild aortic atherosclerosis. Bovine  branching pattern noted. No stenosis about the origin the great vessels. Right carotid system: Right common and internal carotid arteries are patent without stenosis or dissection. Left carotid system: Left common and internal carotid arteries are patent without stenosis or dissection. Vertebral arteries: Both vertebral arteries arise from subclavian arteries. No proximal subclavian artery stenosis. Right vertebral artery slightly dominant. Vertebral arteries are patent without stenosis or dissection. Skeleton: Few scattered lucent l lesions noted within the visualized spine, most pronounced about the cervicothoracic junction at T2 (series 9, image 94). Findings concerning for metastatic disease. Moderate cervical spondylosis with superimposed OPLL at C3-4 through C5-6. Other neck: No other acute finding. Upper chest: Few scattered pulmonary nodules measuring up to 5-6 mm within the lungs, concerning for possible metastatic disease given the history of breast cancer. Asymmetric skin thickening noted about the partially visualized left breast. Review of the MIP images confirms the above findings CTA HEAD FINDINGS Anterior circulation: Mild atheromatous change about the carotid siphons without hemodynamically significant stenosis. 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 A1 segments patent bilaterally. Both ACAs patent without significant stenosis. No M1 stenosis or occlusion. Diffuse small vessel atheromatous irregularity seen throughout the MCA branches bilaterally, with specific note of a severe proximal left M3 stenosis, inferior division (series 8, image 146). Attenuated but patent flow is seen distally. Posterior circulation: Both V4 segments patent without stenosis. Both PICA patent. Basilar patent without stenosis. Superior cerebellar and posterior cerebral arteries patent bilaterally. Venous sinuses: Not well assessed due to  timing the contrast bolus. Anatomic variants: None significant. Review of the MIP images confirms the above findings IMPRESSION: 1. Negative CTA for large vessel occlusion or other emergent finding. 2. Diffuse small vessel atheromatous irregularity throughout the MCA branches bilaterally, with specific note of a severe proximal left M3 stenosis, inferior division. 3. 4 mm left PCOM aneurysm. 4. Few scattered lucent lesions within the visualized spine, most pronounced about the cervicothoracic junction at T2. Findings concerning for metastatic disease given the history of breast cancer. 5. Few scattered pulmonary nodules measuring up to 5-6 mm within the lungs, also concerning for possible metastatic disease. 6. Asymmetric skin thickening about the partially visualized left breast, presumably correlating with patient's known malignancy. Correlation with physical exam recommended. 7. Aortic Atherosclerosis (ICD10-I70.0). Preliminary results discussed by telephone at the time of interpretation on 09/14/2023 at 9:25 pm with provider ERIC LINDZEN. Electronically Signed   By: Rise Mu M.D.   On: 09/14/2023 21:44   CT HEAD CODE STROKE WO CONTRAST  Result Date: 09/14/2023 CLINICAL DATA:  Code stroke. Evaluation for neuro deficit, stroke suspected. EXAM: CT HEAD WITHOUT CONTRAST TECHNIQUE: Contiguous axial images were obtained from the base of the skull through the vertex without intravenous contrast. RADIATION DOSE REDUCTION: This exam was performed according to the departmental dose-optimization program which includes automated exposure control, adjustment of the mA and/or kV according to patient size and/or use of iterative reconstruction technique. COMPARISON:  Prior study from 08/27/2021. FINDINGS: Brain: Cerebral volume within normal limits. Extensive patchy and confluent hypodensity involving the supratentorial cerebral white matter, consistent with chronic small vessel ischemic disease, advanced in  nature. Superimposed remote lacunar infarcts present about the deep gray nuclei. Few small remote right cerebellar infarcts. No acute large vessel territory infarct. No acute intracranial hemorrhage. No mass lesion or midline shift. No hydrocephalus or extra-axial fluid collection. Vascular: No abnormal hyperdense vessel. Scattered vascular calcifications noted within the carotid siphons. Skull: Scalp soft tissues within normal limits.  Calvarium intact. Sinuses/Orbits: Globes and orbital soft tissues demonstrate no acute finding. Paranasal sinuses are largely clear. No significant mastoid effusion. Other: None. ASPECTS [facility] Stroke Program Early CT Score) - Ganglionic level infarction (caudate, lentiform nuclei, internal capsule, insula, M1-M3 cortex): 7 - Supraganglionic infarction (M4-M6 cortex): 3 Total score (0-10 with 10 being normal): 10 IMPRESSION: 1. No acute intracranial abnormality. 2. ASPECTS is 10. 3. Advanced chronic microvascular ischemic disease, with a few small remote right cerebellar infarcts. These results were communicated to Dr. Otelia Limes at 9:04 pm on 09/14/2023 by text page via the [facility] messaging system. Electronically Signed   By: Rise Mu M.D.   On: 09/14/2023 21:06           LOS: 0 days   Time spent= 35 mins    Miguel Rota, MD [facility]  If 7PM-7AM, please contact night-coverage  09/15/2023, 8:15 AM

## 2023-09-15 NOTE — ED Notes (Signed)
Patient transported to MRI 

## 2023-09-15 NOTE — ED Notes (Signed)
This RN updated daughter on patient status.

## 2023-09-15 NOTE — Progress Notes (Signed)
[facility] [id number] - [facility] hospitalized hospice patient visit  Sheila Perez is a current [facility] patient with a terminal diagnosis of malignant breast cancer. Her caregiver noticed on Friday that patient was becoming increasingly weaker with noted weakness to the left side and she activated EMS. She was admitted to the hospital on Friday 11.1 with a diagnosis of acute encephalopathy. Per Dr. Patric Dykes with [facility] this is a related hospital admission.   Patient is alert this morning and does not have complaints. Discussed events that led to patient being brought to the hospital and daughter reports that patient has been increasingly weaker and yesterday they assisted her to the [facility] where patient experienced episode of unresponsiveness. Daughter discussed that day to day care of patient is becoming harder especially when it comes to any transfers out of the bed. We discussed what services can and can not be provided by hospice both at home and in facilities vs the hospice inpatient unit. Daughter requests follow up from home care social worker. She reports that patient will likely discharge tomorrow per MD.   She is inpatient appropriate with need for testing and treatment related to acute encephalopathy.  Vital Signs: 98/83/19      173/89     99% on room air I&O: 1194/0 Abnormal labs: Calcium 10.4, Phos 5, Albumin 3.4, WBC 11.8 Diagnostics:  MRI HEAD WITHOUT CONTRAST IMPRESSION: 1. No acute intracranial abnormality. 2. Advanced chronic microvascular ischemic disease with multiple scattered remote lacunar infarcts involving the bilateral basal ganglia, thalami, and pons. 3. Innumerable chronic micro hemorrhages throughout the brain. Findings are nonspecific, and could reflect changes of cerebral amyloid angiopathy or chronic poorly controlled hypertension. 4. 1.1 cm lesion at the left frontal calvarium, nonspecific, but could potentially reflect an osseous  metastasis given previously seen findings in the spine on prior CTA. Attention at follow-up recommended. Electronically Signed   By: Rise Mu M.D. IV/PRN Meds: LR 239ml>125ml/H IV continuous, Kcl IV x4 Problem List as per MD note, Dr. Stephania Fragmin 11.2.24: Acute encephalopathy - No acute ischemic changes seen on the CT head, MRI of the brain but does show significant chronic changes with also likely osseous metastatic disease.  Patient has been active with outpatient hospice, will reach out to [facility] to contact their service.  Neurology was consulted by the admitting provider.  TSH, B12 normal.  Will check UA, ammonia as daughter still desires to treat any treatable condition.   Hypokalemia/hypocalcemia - Monitor and replete electrolytes as needed   Mild renal insufficiency/AKI - Admission creatinine 1.22, baseline 0.9.  Gentle fluids.   Breast cancer with metastatic disease - Was diagnosed in 2023.  No longer any acute treatment.  Active with outpatient hospice service.  Will get them involved again.  Pain control.   History of HTN/HLD - Does not appear to be any meds.  IV as needed.    Discharge Planning: Ongoing, per daughter back home, likely tomorrow  Family Contact: talked with daughter IDT: Updated Goals of Care: DNR Thea Gist, BSN RN Hospice hospital liaison [phone]

## 2023-09-15 NOTE — ED Notes (Addendum)
ED TO INPATIENT HANDOFF REPORT  ED Nurse Name and Phone #:  Marcello Moores [phone]  S Name/Age/Gender Sheila Perez Sheila Perez 77 y.o. female Room/Bed: 033C/033C  Code Status   Code Status: Limited: Do not attempt resuscitation (DNR) -DNR-LIMITED -Do Not Intubate/DNI   Home/SNF/Other Home Patient oriented to: self and place Is this baseline? No   Triage Complete: Triage complete  Chief Complaint Acute encephalopathy [G93.40]  Triage Note Patient BIB [facility] from home as a code stroke. Patient caregiver noticed left sided weakness and facial droop. Patient also endorses aphasia. Baseline patient can hold a conversation and patient is unable to now. L sided breast cancer. Patient is hospice patient. Patient alert but confused.   Allergies No Known Allergies  Level of Care/Admitting Diagnosis ED Disposition     ED Disposition  Admit   Condition  --   Comment  Hospital Area: [facility] [[id number]]  Level of Care: Progressive [102]  Admit to Progressive based on following criteria: MULTISYSTEM THREATS such as stable sepsis, metabolic/electrolyte imbalance with or without encephalopathy that is responding to early treatment.  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   Blood transfusion without reported diagnosis    Diabetes mellitus without complication (HCC)    prediabetic-no medications   Gait abnormality    Hyperlipidemia    Hypertension     Neuromuscular disorder (HCC)    never damage for the way she walks   Stroke [facility])    mimi   Past Surgical History:  Procedure Laterality Date   ABDOMINAL HYSTERECTOMY     maxilo facial surgery       A IV Location/Drains/Wounds Patient Lines/Drains/Airways Status     Active Line/Drains/Airways     Name Placement date Placement time Site Days   Peripheral IV 09/14/23 20 G Right Antecubital 09/14/23  --  Antecubital  1            Intake/Output Last 24 hours No intake or output data in the 24 hours ending 09/15/23 0053  Labs/Imaging Results for orders placed or performed during the hospital encounter of 09/14/23 (from the past 48 hour(s))  I-stat chem 8, ED     Status: Abnormal   Collection Time: 09/14/23  8:54 PM  Result Value Ref Range   Sodium 136 135 - 145 mmol/L   Potassium 3.5 3.5 - 5.1 mmol/L   Chloride 104 98 - 111 mmol/L   BUN 24 (H) 8 - 23 mg/dL   Creatinine, Ser 2.35 (H) 0.44 - 1.00 mg/dL   Glucose, Bld 573 (H) 70 - 99 mg/dL    Comment: Glucose reference range applies only to samples taken after fasting for at least 8 hours.   Calcium, Ion 1.05 (L) 1.15 - 1.40 mmol/L   TCO2 21 (L) 22 - 32 mmol/L   Hemoglobin 14.6 12.0 - 15.0 g/dL   HCT 22.0 25.4 - 27.0 %  CBC     Status: Abnormal   Collection Time: 09/14/23  9:00 PM  Result Value  Ref Range   WBC 11.3 (H) 4.0 - 10.5 K/uL   RBC 4.61 3.87 - 5.11 MIL/uL   Hemoglobin 13.6 12.0 - 15.0 g/dL   HCT 16.1 09.6 - 04.5 %   MCV 93.9 80.0 - 100.0 fL   MCH 29.5 26.0 - 34.0 pg   MCHC 31.4 30.0 - 36.0 g/dL   RDW 40.9 81.1 - 91.4 %   Platelets 274 150 - 400 K/uL   nRBC 0.0 0.0 - 0.2 %    Comment: Performed at [facility], [address] [place], [place] [address]  Differential     Status: Abnormal   Collection Time: 09/14/23  9:00 PM  Result Value Ref Range   Neutrophils Relative % 72 %   Neutro Abs 8.3 (H) 1.7 - 7.7 K/uL   Lymphocytes Relative 18 %   Lymphs Abs 2.0 0.7 - 4.0 K/uL   Monocytes Relative 5 %    Monocytes Absolute 0.5 0.1 - 1.0 K/uL   Eosinophils Relative 3 %   Eosinophils Absolute 0.3 0.0 - 0.5 K/uL   Basophils Relative 1 %   Basophils Absolute 0.1 0.0 - 0.1 K/uL   Immature Granulocytes 1 %   Abs Immature Granulocytes 0.07 0.00 - 0.07 K/uL    Comment: Performed at [facility], [address] [place], [place] [address]  Comprehensive metabolic panel     Status: Abnormal   Collection Time: 09/14/23  9:00 PM  Result Value Ref Range   Sodium 137 135 - 145 mmol/L   Potassium 3.4 (L) 3.5 - 5.1 mmol/L   Chloride 103 98 - 111 mmol/L   CO2 20 (L) 22 - 32 mmol/L   Glucose, Bld 154 (H) 70 - 99 mg/dL    Comment: Glucose reference range applies only to samples taken after fasting for at least 8 hours.   BUN 20 8 - 23 mg/dL   Creatinine, Ser 8.65 (H) 0.44 - 1.00 mg/dL   Calcium 78.4 (H) 8.9 - 10.3 mg/dL   Total Protein 7.0 6.5 - 8.1 g/dL   Albumin 3.4 (L) 3.5 - 5.0 g/dL   AST 23 15 - 41 U/L   ALT 12 0 - 44 U/L   Alkaline Phosphatase 70 38 - 126 U/L   Total Bilirubin 0.8 0.3 - 1.2 mg/dL   GFR, Estimated 47 (L) >60 mL/min    Comment: (NOTE) Calculated using the CKD-EPI Creatinine Equation (2021)    Anion gap 14 5 - 15    Comment: Performed at [facility], [address] [place], [place] [address]  Ethanol     Status: None   Collection Time: 09/14/23  9:02 PM  Result Value Ref Range   Alcohol, Ethyl (B) <10 <10 mg/dL    Comment: (NOTE) Lowest detectable limit for serum alcohol is 10 mg/dL.  For medical purposes only. Performed at [facility], [address] [place], [place] [address]    CT ANGIO HEAD NECK W WO CM (CODE STROKE)  Result Date: 09/14/2023 CLINICAL DATA:  Initial evaluation for neuro deficit, stroke suspected. No other relevant history is provided. EXAM: CT ANGIOGRAPHY HEAD AND NECK WITH AND WITHOUT CONTRAST TECHNIQUE: Multidetector CT imaging of the head and neck was performed using the standard protocol during bolus administration of  intravenous contrast. Multiplanar CT image reconstructions and MIPs were obtained to evaluate the vascular anatomy. Carotid stenosis measurements (when applicable) are obtained utilizing NASCET criteria, using the distal internal carotid diameter as the denominator.  RADIATION DOSE REDUCTION: This exam was performed according to the departmental dose-optimization program which includes automated exposure control, adjustment of the mA and/or kV according to patient size and/or use of iterative reconstruction technique. CONTRAST:  60mL OMNIPAQUE IOHEXOL 350 MG/ML SOLN COMPARISON:  CT from earlier the same day. FINDINGS: CTA NECK FINDINGS Aortic arch: Visualized aortic arch within normal limits for caliber. Mild aortic atherosclerosis. Bovine branching pattern noted. No stenosis about the origin the great vessels. Right carotid system: Right common and internal carotid arteries are patent without stenosis or dissection. Left carotid system: Left common and internal carotid arteries are patent without stenosis or dissection. Vertebral arteries: Both vertebral arteries arise from subclavian arteries. No proximal subclavian artery stenosis. Right vertebral artery slightly dominant. Vertebral arteries are patent without stenosis or dissection. Skeleton: Few scattered lucent l lesions noted within the visualized spine, most pronounced about the cervicothoracic junction at T2 (series 9, image 94). Findings concerning for metastatic disease. Moderate cervical spondylosis with superimposed OPLL at C3-4 through C5-6. Other neck: No other acute finding. Upper chest: Few scattered pulmonary nodules measuring up to 5-6 mm within the lungs, concerning for possible metastatic disease given the history of breast cancer. Asymmetric skin thickening noted about the partially visualized left breast. Review of the MIP images confirms the above findings CTA HEAD FINDINGS Anterior circulation: Mild atheromatous change about the carotid  siphons without hemodynamically significant stenosis. 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 A1 segments patent bilaterally. Both ACAs patent without significant stenosis. No M1 stenosis or occlusion. Diffuse small vessel atheromatous irregularity seen throughout the MCA branches bilaterally, with specific note of a severe proximal left M3 stenosis, inferior division (series 8, image 146). Attenuated but patent flow is seen distally. Posterior circulation: Both V4 segments patent without stenosis. Both PICA patent. Basilar patent without stenosis. Superior cerebellar and posterior cerebral arteries patent bilaterally. Venous sinuses: Not well assessed due to timing the contrast bolus. Anatomic variants: None significant. Review of the MIP images confirms the above findings IMPRESSION: 1. Negative CTA for large vessel occlusion or other emergent finding. 2. Diffuse small vessel atheromatous irregularity throughout the MCA branches bilaterally, with specific note of a severe proximal left M3 stenosis, inferior division. 3. 4 mm left PCOM aneurysm. 4. Few scattered lucent lesions within the visualized spine, most pronounced about the cervicothoracic junction at T2. Findings concerning for metastatic disease given the history of breast cancer. 5. Few scattered pulmonary nodules measuring up to 5-6 mm within the lungs, also concerning for possible metastatic disease. 6. Asymmetric skin thickening about the partially visualized left breast, presumably correlating with patient's known malignancy. Correlation with physical exam recommended. 7. Aortic Atherosclerosis (ICD10-I70.0). Preliminary results discussed by telephone at the time of interpretation on 09/14/2023 at 9:25 pm with provider ERIC LINDZEN. Electronically Signed   By: Rise Mu M.D.   On: 09/14/2023 21:44   CT HEAD CODE STROKE WO CONTRAST  Result Date:  09/14/2023 CLINICAL DATA:  Code stroke. Evaluation for neuro deficit, stroke suspected. EXAM: CT HEAD WITHOUT CONTRAST TECHNIQUE: Contiguous axial images were obtained from the base of the skull through the vertex without intravenous contrast. RADIATION DOSE REDUCTION: This exam was performed according to the departmental dose-optimization program which includes automated exposure control, adjustment of the mA and/or kV according to patient size and/or use of iterative reconstruction technique. COMPARISON:  Prior study from 08/27/2021. FINDINGS: Brain: Cerebral volume within normal limits. Extensive patchy and confluent  hypodensity involving the supratentorial cerebral white matter, consistent with chronic small vessel ischemic disease, advanced in nature. Superimposed remote lacunar infarcts present about the deep gray nuclei. Few small remote right cerebellar infarcts. No acute large vessel territory infarct. No acute intracranial hemorrhage. No mass lesion or midline shift. No hydrocephalus or extra-axial fluid collection. Vascular: No abnormal hyperdense vessel. Scattered vascular calcifications noted within the carotid siphons. Skull: Scalp soft tissues within normal limits.  Calvarium intact. Sinuses/Orbits: Globes and orbital soft tissues demonstrate no acute finding. Paranasal sinuses are largely clear. No significant mastoid effusion. Other: None. ASPECTS [facility] Stroke Program Early CT Score) - Ganglionic level infarction (caudate, lentiform nuclei, internal capsule, insula, M1-M3 cortex): 7 - Supraganglionic infarction (M4-M6 cortex): 3 Total score (0-10 with 10 being normal): 10 IMPRESSION: 1. No acute intracranial abnormality. 2. ASPECTS is 10. 3. Advanced chronic microvascular ischemic disease, with a few small remote right cerebellar infarcts. These results were communicated to Dr. Otelia Limes at 9:04 pm on 09/14/2023 by text page via the [facility] messaging system. Electronically Signed   By: Rise Mu M.D.   On: 09/14/2023 21:06    Pending Labs Unresulted Labs (From admission, onward)     Start     Ordered   09/15/23 0500  CBC with Differential/Platelet  Tomorrow morning,   R        09/15/23 0046   09/15/23 0500  Comprehensive metabolic panel  Tomorrow morning,   R        09/15/23 0046   09/15/23 0500  Magnesium  Tomorrow morning,   R        09/15/23 0046   09/15/23 0500  Phosphorus  Tomorrow morning,   R        09/15/23 0046   09/15/23 0047  Magnesium  Add-on,   AD        09/15/23 0046   09/14/23 2105  Protime-INR  Once,   STAT        09/14/23 2105   09/14/23 2105  APTT  Once,   STAT        09/14/23 2105   09/14/23 2046  Urine rapid drug screen (hosp performed)  Once,   STAT        09/14/23 2046   09/14/23 2046  Urinalysis, Routine w reflex microscopic -Urine, Clean Catch  Once,   URGENT       Question:  Specimen Source  Answer:  Urine, Clean Catch   09/14/23 2046            Vitals/Pain Today's Vitals   09/14/23 2121 09/14/23 2230 09/14/23 2330 09/15/23 0045  BP:  (!) 168/96 (!) 175/104 (!) 167/111  Pulse:  94 97 91  Resp:  20 (!) 25 (!) 21  Temp:      TempSrc:      SpO2:  97% 97% 98%  Weight:      Height: 5\' 2"  (1.575 m)       Isolation Precautions No active isolations  Medications Medications  lactated ringers infusion ( Intravenous New Bag/Given 09/15/23 0007)  acetaminophen (TYLENOL) tablet 650 mg (has no administration in time range)    Or  acetaminophen (TYLENOL) suppository 650 mg (has no administration in time range)  melatonin tablet 3 mg (has no administration in time range)  ondansetron (ZOFRAN) injection 4 mg (has no administration in time range)  iohexol (OMNIPAQUE) 350 MG/ML injection 60 mL (60 mLs Intravenous Contrast Given 09/14/23 2110)  naloxone (NARCAN) injection 0.4 mg (0.4 mg Intravenous Given  09/14/23 2113)    Mobility      Focused Assessments Neuro Assessment Handoff:  Swallow screen pass? Yes    NIH Stroke Scale   Dizziness Present: No Headache Present: No Interval: Shift assessment Level of Consciousness (1a.)   : Alert, keenly responsive LOC Questions (1b. )   : Answers one question correctly LOC Commands (1c. )   : Performs both tasks correctly Best Gaze (2. )  : Normal Visual (3. )  : No visual loss Facial Palsy (4. )    : Minor paralysis Motor Arm, Left (5a. )   : Drift Motor Arm, Right (5b. ) : No drift Motor Leg, Left (6a. )  : Some effort against gravity Motor Leg, Right (6b. ) : Some effort against gravity Limb Ataxia (7. ): Absent Sensory (8. )  : Normal, no sensory loss Best Language (9. )  : Mild-to-moderate aphasia Dysarthria (10. ): Mild-to-moderate dysarthria, patient slurs at least some words and, at worst, can be understood with some difficulty Extinction/Inattention (11.)   : No Abnormality Complete NIHSS TOTAL: 9 Last date known well: 09/14/23 Last time known well: 1930 Neuro Assessment:   Neuro Checks:   Initial (09/14/23 2044)  Has TPA been given? No  ,   R Recommendations: See Admitting Provider Note  Report given to:   Additional Notes: NIH has been 9. Legs very weak at baseline. Hospice patient.

## 2023-09-15 NOTE — Plan of Care (Signed)
  Problem: Education: Goal: Knowledge of General Education information will improve Description Including pain rating scale, medication(s)/side effects and non-pharmacologic comfort measures Outcome: Progressing   

## 2023-09-15 NOTE — Progress Notes (Addendum)
STROKE TEAM PROGRESS NOTE   BRIEF HPI Ms. ZOXWRUEA Allexis Bordenave is a 77 year old with history of metastatic breast cancer no longer on treatment on outpatient hospice, HTN, HLD brought to the hospital 11/1 d/t acute encephalopathy, fluctuating over past 2 weeks.   CT head, CTA head and neck are negative for acute pathology.  MRI brain is negative for acute pathology but shows chronic advance microvascular ischemic changes, chronic microhemorrhages consistent with possible some sort of angiopathy, 1.1 cm left frontal calvarium lesion possibly osseous metastases or meningioma.    INTERIM HISTORY/SUBJECTIVE  Daughter at bedside, states patient has been on hospice care for about a year, but has had a recent decline in mental status and mobility.  Patient is lethargic, does not follow commands. Daughter states that she is overall at her baseline.  MRI scan of the brain shows no acute stroke.  CT angiogram of the brain and neck shows no large vessel stenosis.  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 OBJECTIVE  CBC    Component Value Date/Time   WBC 11.8 (H) 09/15/2023 0555   RBC 4.71 09/15/2023 0555   HGB 14.0 09/15/2023 0555   HGB 13.1 04/12/2022 1236   HCT 43.1 09/15/2023 0555   PLT 305 09/15/2023 0555   PLT 227 04/12/2022 1236   MCV 91.5 09/15/2023 0555   MCH 29.7 09/15/2023 0555   MCHC 32.5 09/15/2023 0555   RDW 13.2 09/15/2023 0555   LYMPHSABS 2.3 09/15/2023 0555   MONOABS 0.8 09/15/2023 0555   EOSABS 0.1 09/15/2023 0555   BASOSABS 0.0 09/15/2023 0555    BMET    Component Value Date/Time   NA 135 09/15/2023 0555   K 4.3 09/15/2023 0555   CL 100 09/15/2023 0555   CO2 25 09/15/2023 0555   GLUCOSE 93 09/15/2023 0555   BUN 18 09/15/2023 0555   CREATININE 0.97 09/15/2023 0555   CREATININE 0.92 04/12/2022 1236   CREATININE 1.12 (H) 03/17/2022 1630   CALCIUM 10.4 (H) 09/15/2023 0555   GFRNONAA >60 09/15/2023 0555   GFRNONAA >60  04/12/2022 1236   GFRNONAA 57 (L) 09/08/2020 1642    IMAGING past 24 hours MR Brain Wo Contrast (neuro protocol)  Result Date: 09/15/2023 CLINICAL DATA:  Initial evaluation for neuro deficit, stroke suspected. EXAM: MRI HEAD WITHOUT CONTRAST TECHNIQUE: Multiplanar, multiecho pulse sequences of the brain and surrounding structures were obtained without intravenous contrast. COMPARISON:  Prior CTs from 09/14/2023. FINDINGS: Brain: Cerebral volume within normal limits for age. Confluent T2/FLAIR hyperintensity involving the periventricular and deep white matter both cerebral hemispheres, consistent with chronic small vessel ischemic disease, fairly advanced in nature. Patchy involvement of the pons noted as well. Multiple scattered remote lacunar infarcts present about the bilateral basal ganglia, thalami, and pons. Small remote right cerebellar infarcts. No abnormal foci of restricted diffusion to suggest acute or subacute ischemia. Gray-white matter differentiation maintained. No other areas of chronic cortical infarction. No acute intracranial hemorrhage. Innumerable chronic micro hemorrhages are seen involving both cerebral and cerebellar hemispheres, with additional involvement of the brainstem. Findings are nonspecific. No mass lesion or midline shift. No hydrocephalus or extra-axial fluid collection. Pituitary gland and suprasellar region within normal limits. Vascular: Major intracranial vascular flow voids are maintained. Skull and upper cervical spine: Craniocervical junction within normal limits. Heterogeneous signal intensity seen throughout the visualized bone marrow. 1.1 cm lesion present at the left frontal calvarium, nonspecific, but could potentially reflect an osseous metastasis given findings  in the spine seen on prior CTA. No scalp soft tissue abnormality. Sinuses/Orbits: Globes and orbital soft tissues within normal limits. Paranasal sinuses are largely clear. No mastoid effusion. Other:  None. IMPRESSION: 1. No acute intracranial abnormality. 2. Advanced chronic microvascular ischemic disease with multiple scattered remote lacunar infarcts involving the bilateral basal ganglia, thalami, and pons. 3. Innumerable chronic micro hemorrhages throughout the brain. Findings are nonspecific, and could reflect changes of cerebral amyloid angiopathy or chronic poorly controlled hypertension. 4. 1.1 cm lesion at the left frontal calvarium, nonspecific, but could potentially reflect an osseous metastasis given previously seen findings in the spine on prior CTA. Attention at follow-up recommended. Electronically Signed   By: Rise Mu M.D.   On: 09/15/2023 01:51   CT ANGIO HEAD NECK W WO CM (CODE STROKE)  Result Date: 09/14/2023 CLINICAL DATA:  Initial evaluation for neuro deficit, stroke suspected. No other relevant history is provided. EXAM: CT ANGIOGRAPHY HEAD AND NECK WITH AND WITHOUT CONTRAST TECHNIQUE: Multidetector CT imaging of the head and neck was performed using the standard protocol during bolus administration of intravenous contrast. Multiplanar CT image reconstructions and MIPs were obtained to evaluate the vascular anatomy. Carotid stenosis measurements (when applicable) are obtained utilizing NASCET criteria, using the distal internal carotid diameter as the denominator. RADIATION DOSE REDUCTION: This exam was performed according to the departmental dose-optimization program which includes automated exposure control, adjustment of the mA and/or kV according to patient size and/or use of iterative reconstruction technique. CONTRAST:  60mL OMNIPAQUE IOHEXOL 350 MG/ML SOLN COMPARISON:  CT from earlier the same day. FINDINGS: CTA NECK FINDINGS Aortic arch: Visualized aortic arch within normal limits for caliber. Mild aortic atherosclerosis. Bovine branching pattern noted. No stenosis about the origin the great vessels. Right carotid system: Right common and internal carotid arteries  are patent without stenosis or dissection. Left carotid system: Left common and internal carotid arteries are patent without stenosis or dissection. Vertebral arteries: Both vertebral arteries arise from subclavian arteries. No proximal subclavian artery stenosis. Right vertebral artery slightly dominant. Vertebral arteries are patent without stenosis or dissection. Skeleton: Few scattered lucent l lesions noted within the visualized spine, most pronounced about the cervicothoracic junction at T2 (series 9, image 94). Findings concerning for metastatic disease. Moderate cervical spondylosis with superimposed OPLL at C3-4 through C5-6. Other neck: No other acute finding. Upper chest: Few scattered pulmonary nodules measuring up to 5-6 mm within the lungs, concerning for possible metastatic disease given the history of breast cancer. Asymmetric skin thickening noted about the partially visualized left breast. Review of the MIP images confirms the above findings CTA HEAD FINDINGS Anterior circulation: Mild atheromatous change about the carotid siphons without hemodynamically significant stenosis. 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 A1 segments patent bilaterally. Both ACAs patent without significant stenosis. No M1 stenosis or occlusion. Diffuse small vessel atheromatous irregularity seen throughout the MCA branches bilaterally, with specific note of a severe proximal left M3 stenosis, inferior division (series 8, image 146). Attenuated but patent flow is seen distally. Posterior circulation: Both V4 segments patent without stenosis. Both PICA patent. Basilar patent without stenosis. Superior cerebellar and posterior cerebral arteries patent bilaterally. Venous sinuses: Not well assessed due to timing the contrast bolus. Anatomic variants: None significant. Review of the MIP images confirms the above findings IMPRESSION: 1.  Negative CTA for large vessel occlusion or other emergent finding. 2. Diffuse small vessel atheromatous irregularity throughout the MCA branches  bilaterally, with specific note of a severe proximal left M3 stenosis, inferior division. 3. 4 mm left PCOM aneurysm. 4. Few scattered lucent lesions within the visualized spine, most pronounced about the cervicothoracic junction at T2. Findings concerning for metastatic disease given the history of breast cancer. 5. Few scattered pulmonary nodules measuring up to 5-6 mm within the lungs, also concerning for possible metastatic disease. 6. Asymmetric skin thickening about the partially visualized left breast, presumably correlating with patient's known malignancy. Correlation with physical exam recommended. 7. Aortic Atherosclerosis (ICD10-I70.0). Preliminary results discussed by telephone at the time of interpretation on 09/14/2023 at 9:25 pm with provider ERIC LINDZEN. Electronically Signed   By: Rise Mu M.D.   On: 09/14/2023 21:44   CT HEAD CODE STROKE WO CONTRAST  Result Date: 09/14/2023 CLINICAL DATA:  Code stroke. Evaluation for neuro deficit, stroke suspected. EXAM: CT HEAD WITHOUT CONTRAST TECHNIQUE: Contiguous axial images were obtained from the base of the skull through the vertex without intravenous contrast. RADIATION DOSE REDUCTION: This exam was performed according to the departmental dose-optimization program which includes automated exposure control, adjustment of the mA and/or kV according to patient size and/or use of iterative reconstruction technique. COMPARISON:  Prior study from 08/27/2021. FINDINGS: Brain: Cerebral volume within normal limits. Extensive patchy and confluent hypodensity involving the supratentorial cerebral white matter, consistent with chronic small vessel ischemic disease, advanced in nature. Superimposed remote lacunar infarcts present about the deep gray nuclei. Few small remote right cerebellar infarcts. No acute  large vessel territory infarct. No acute intracranial hemorrhage. No mass lesion or midline shift. No hydrocephalus or extra-axial fluid collection. Vascular: No abnormal hyperdense vessel. Scattered vascular calcifications noted within the carotid siphons. Skull: Scalp soft tissues within normal limits.  Calvarium intact. Sinuses/Orbits: Globes and orbital soft tissues demonstrate no acute finding. Paranasal sinuses are largely clear. No significant mastoid effusion. Other: None. ASPECTS [facility] Stroke Program Early CT Score) - Ganglionic level infarction (caudate, lentiform nuclei, internal capsule, insula, M1-M3 cortex): 7 - Supraganglionic infarction (M4-M6 cortex): 3 Total score (0-10 with 10 being normal): 10 IMPRESSION: 1. No acute intracranial abnormality. 2. ASPECTS is 10. 3. Advanced chronic microvascular ischemic disease, with a few small remote right cerebellar infarcts. These results were communicated to Dr. Otelia Limes at 9:04 pm on 09/14/2023 by text page via the [facility] messaging system. Electronically Signed   By: Rise Mu M.D.   On: 09/14/2023 21:06    Vitals:   09/15/23 0128 09/15/23 0221 09/15/23 0750 09/15/23 1052  BP:  (!) 168/93 (!) 191/96 (!) 173/89  Pulse:  94 90 83  Resp:  (!) 24 17 19   Temp: 98.9 F (37.2 C) 99.6 F (37.6 C) 98.4 F (36.9 C) 98 F (36.7 C)  TempSrc: Oral Oral Oral Oral  SpO2:  100% 100% 99%  Weight:      Height:         PHYSICAL EXAM General:  Frail, chronically ill, NAD CV: Regular rate and rhythm on monitor Respiratory:  Regular, unlabored respirations on room air GI: Abdomen soft and nontender  NEURO:  Mental Status: Lethargic, no verbal output, does not follow commands. Generalized weakness, localizes with BUE, withdraw to pain BLE.   ASSESSMENT/PLAN  Encephalopathy with altered mental status doubt stroke Etiology:  ?hypercoagulable state secondary to cancer vs small vessel disease Code Stroke CT head: No acute abnormality. Small  vessel disease. ASPECTS 10.  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  Left frontal calvarium lesion, metasases vs meningioma  2D Echo: EF 55-60%, No LVH, NO shunt LDL 178 HgbA1c 5.8 VTE prophylaxis - SCDs No antithrombotic prior to admission, now on aspirin 81 mg daily  Therapy recommendations:  Pending Disposition:  home hospice  Hypertension Home meds:  none BP goal normotensive.   Hyperlipidemia Home meds:  none LDL 178, goal < 70 Add Lipitor 80mg   Continue statin at discharge  Diabetes type II, Pre-Diabetic Home meds:  none HgbA1c 5.8, goal < 7.0 CBGs SSI  Other Stroke Risk Factors Hx of Metastatic Breast Cancer No further treatment, hospice patient.  Pain control per primary   Hospital day # 0   Pt seen by Neuro NP/APP and later by MD. Note/plan to be edited by MD as needed.    Lynnae January, DNP, AGACNP-BC [facility] Please use AMION for contact information & EPIC for messaging.  I have personally obtained history,examined this patient, reviewed notes, independently viewed imaging studies, participated in medical decision making and plan of care.ROS completed by me personally and pertinent positives fully documented  I have made any additions or clarifications directly to the above note. Agree with note above.  Patient with metastatic breast cancer no longer on treatment on on home hospice has had a gradual cognitive and physical decline with lack of ambulation for the last several weeks to months.  MRI scan is negative for acute stroke and CT angiogram is pretty much  unremarkable.  Recommend aspirin for stroke prevention and full dose statin for elevated lipids.  Patient is a hospice and home care only hence will not recommend more aggressive workup.  Long discussion patient and daughter at the bedside and answered questions.  Discussed with Dr. Nelson Chimes.  Greater than 50% time during this 50-minute visit was spent in counseling and coordination of care about her metastatic cancer and altered mental status and discussion with patient and care team and daughter and months questions.  Stroke team will sign off.  Delia Heady, MD Medical Director [facility] Pager: [phone] 09/15/2023 1:20 PM   To contact Stroke Continuity provider, please refer to [email or web address]. After hours, contact General Neurology

## 2023-09-15 NOTE — H&P (Addendum)
History and Physical      Sheila Perez ZOX:[id number] DOB: 29-Jun-1946 DOA: 09/14/2023; DOS: 09/15/2023  PCP: [facility]  Patient coming from: home   I have personally briefly reviewed patient's old medical records in [facility]  Chief Complaint: Altered mental status  HPI: Sheila Perez is a 77 y.o. female with medical history significant for metastatic breast cancer, who is admitted to [facility] on 09/14/2023 with acute encephalopathy after presenting from home to [facility] ED complaining of altered mental status.    In the setting of the patient's altered mental status, the following history is provided by the patient's daughter in addition to my discussions with the EDP and via chart review.  The patient was diagnosed with metastatic breast cancer in 2023.  Shortly thereafter, chemotherapy/radiation were discontinued, including the palliative variety, and the patient has not been receiving any dedicated chemotherapy or radiation for her metastatic breast cancer for over a year.  Rather, she is on hospice, palliative care with [facility], with outpatient opioid intervention that includes morphine as well as prn oxycodone.  Daughter conveys that the patient has had waxing and waning altered mental status, somnolence over the course the last 2 weeks, but noted that the patient was more confused, somnolent on 09/14/23.  Daughter was also concerned over some potential new left-sided weakness.  Consequently, the patient presented as a code stroke to [facility] ED this evening, although acute focal neurologic deficits were identified, with the patient at the time unable to participate in the full neurologic assessment.   Daughter conveyed that the patient would not want life preserving intervention, is DNR, DNI, but would be amenable to intervention that makes the patient more comfortable. Specifically, daughter conveys that the patient may be amenable to palliative  radiation if MRI brain showed metastatic dx to the brain, with the daughter also conveying her understanding that intervention relating to treatment of the patient's underlying malignancy would not be curative in nature. In light of this, MRI brain was ordered in the ED.     ED Course:  Vital signs in the ED were notable for the following: Afebrile; heart rate initially in the low 100s consistently decreasing into the 90s following interval initiation of IV fluids, as further detailed below; systolic blood pressures in the 150s to 170s; respiratory rate 20-26, oxygen saturation 97 to 99% on room air.  Labs were notable for the following: CMP notable for the following: Sodium 137, potassium 3.4, creatinine 1.22 compared to most recent prior serum creatinine data point of 0.92 on 04/12/2022, glucose 154, calcium, adjusted for mild hypoalbuminemia noted to be 10.9, avidin 3.4.  Otherwise, liver enzymes were within normal limits.  Serum ethanol level less than 10.  CBC notable for white blood cell count 11,300.  Urinalysis and urinary drug screen been ordered, with results currently pending.  Per my interpretation, EKG in ED demonstrated the following: Sinus tachycardia with heart rate 107, normal intervals, and no evidence of T wave or ST changes, including evidence of ST elevation.  Imaging in the ED, per corresponding formal radiology read, was notable for the following: CT head showed no evidence of acute intracranial process, including no evidence of intracranial hemorrhage nor any evidence of acute infarct.  CTA head and neck showed no evidence of large vessel occlusion.  MRI brain ordered, with result currently pending.  While in the ED, the following were administered: Lactated Ringer's at 200 cc/h x 5-hour duration.  Subsequently, the patient was admitted  for further evaluation management of presenting acute encephalopathy, with presenting labs also notable for hypokalemia, hypercalcemia, as well  as acute kidney injury.    Review of Systems: As per HPI otherwise 10 point review of systems negative.   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 She has never used smokeless tobacco. She reports that she does not drink alcohol and does not use drugs.   No Known Allergies  Family History  Problem Relation Age of Onset   Kidney disease Mother    Diabetes Mother    Alcohol abuse Father    Diabetes Sister    Breast cancer Sister    Diabetes Sister    Ovarian cancer Sister 6   Breast cancer Sister    Cancer Brother    Diabetes Daughter    Breast cancer Niece    Colon cancer Neg Hx    Colon polyps Neg Hx    Heart disease Neg Hx    Rectal cancer Neg Hx    Stomach cancer Neg Hx     Family history reviewed and not pertinent    Prior to Admission medications   Medication Sig Start Date End Date Taking? Authorizing Provider  cholecalciferol (VITAMIN D3) 25 MCG (1000 UNIT) tablet Take 1,000 Units by mouth daily.   Yes [provider]  Morphine Sulfate (MORPHINE CONCENTRATE) 10 mg / 0.5 ml concentrated solution Take 20 mg by mouth. 08/28/23  Yes [provider]  OVER THE COUNTER MEDICATION Take 1 capsule by mouth 2 (two) times daily. [place] tail mushrooms   Yes [provider]  oxyCODONE (OXY IR/ROXICODONE) 5 MG immediate release tablet Take 5 mg by mouth every 4 (four) hours as needed. 08/06/23  Yes [provider]  sorbitol 70 % SOLN Take 30 mLs by mouth daily as needed for mild  constipation or moderate constipation. 07/21/23  Yes [provider]  STIMULANT LAXATIVE 8.6-50 MG tablet Take 2 tablets by mouth 2 (two) times daily. 06/01/23  Yes [provider]     Objective    Physical Exam: Vitals:   09/14/23 2230 09/14/23 2330 09/15/23 0045 09/15/23 0128  BP: (!) 168/96 (!) 175/104 (!) 167/111   Pulse: 94 97 91   Resp: 20 (!) 25 (!) 21   Temp:    98.9 F (37.2 C)  TempSrc:    Oral  SpO2: 97% 97% 98%   Weight:      Height:        General: appears to be stated age; somnolent Skin: warm, dry, no rash Head:  AT/[place] Mouth:  Oral mucosa membranes appear dry, normal dentition Neck: supple; trachea midline Heart:  RRR; did not appreciate any M/R/G Lungs: CTAB, did not appreciate any wheezes, rales, or rhonchi Abdomen: + BS; soft, ND, NT Vascular: 2+ pedal pulses b/l; 2+ radial pulses b/l Extremities: no peripheral edema, no muscle wasting Neuro: In the setting of the patient's current mental status and associated  inability to follow instructions, unable to perform full neurologic exam at this time.  As such, assessment of strength, sensation, and cranial nerves is limited at this time. Patient noted to spontaneously move all 4 extremities. No tremors.      Labs on Admission: I have personally reviewed following labs and imaging studies  CBC: Recent Labs  Lab 09/14/23 2054 09/14/23 2100  WBC  --  11.3*  NEUTROABS  --  8.3*  HGB 14.6 13.6  HCT 43.0 43.3  MCV  --  93.9  PLT  --  274   Basic Metabolic Panel: Recent Labs  Lab 09/14/23 2054 09/14/23 2100  NA 136 137  K 3.5 3.4*  CL 104 103  CO2  --  20*  GLUCOSE 156* 154*  BUN 24* 20  CREATININE 1.10* 1.22*  CALCIUM  --  10.5*  MG  --  1.9   GFR: Estimated Creatinine Clearance: 35.6 mL/min (A) (by C-G formula based on SCr of 1.22 mg/dL (H)). Liver Function Tests: Recent Labs  Lab 09/14/23 2100  AST 23  ALT 12  ALKPHOS 70  BILITOT 0.8  PROT 7.0  ALBUMIN 3.4*   No  results for input(s): "LIPASE", "AMYLASE" in the last 168 hours. No results for input(s): "AMMONIA" in the last 168 hours. Coagulation Profile: No results for input(s): "INR", "PROTIME" in the last 168 hours. Cardiac Enzymes: No results for input(s): "CKTOTAL", "CKMB", "CKMBINDEX", "TROPONINI" in the last 168 hours. BNP (last 3 results) No results for input(s): "PROBNP" in the last 8760 hours. HbA1C: No results for input(s): "HGBA1C" in the last 72 hours. CBG: No results for input(s): "GLUCAP" in the last 168 hours. Lipid Profile: No results for input(s): "CHOL", "HDL", "LDLCALC", "TRIG", "CHOLHDL", "LDLDIRECT" in the last 72 hours. Thyroid Function Tests: No results for input(s): "TSH", "T4TOTAL", "FREET4", "T3FREE", "THYROIDAB" in the last 72 hours. Anemia Panel: No results for input(s): "VITAMINB12", "FOLATE", "FERRITIN", "TIBC", "IRON", "RETICCTPCT" in the last 72 hours. Urine analysis:    Component Value Date/Time   COLORURINE YELLOW 08/27/2021 1126   APPEARANCEUR CLEAR 08/27/2021 1126   LABSPEC 1.010 08/27/2021 1126   PHURINE 7.0 08/27/2021 1126   GLUCOSEU NEGATIVE 08/27/2021 1126   HGBUR NEGATIVE 08/27/2021 1126   BILIRUBINUR NEGATIVE 08/27/2021 1126   KETONESUR 5 (A) 08/27/2021 1126   PROTEINUR 30 (A) 08/27/2021 1126   UROBILINOGEN 0.2 07/17/2017 1840   NITRITE NEGATIVE 08/27/2021 1126   LEUKOCYTESUR NEGATIVE 08/27/2021 1126    Radiological Exams on Admission: CT ANGIO HEAD NECK W WO CM (CODE STROKE)  Result Date: 09/14/2023 CLINICAL DATA:  Initial evaluation for neuro deficit, stroke suspected. No other relevant history is provided. EXAM: CT ANGIOGRAPHY HEAD AND NECK WITH AND WITHOUT CONTRAST TECHNIQUE: Multidetector CT imaging of the head and neck was performed using the standard protocol during bolus administration of intravenous contrast. Multiplanar CT image reconstructions and MIPs were obtained to evaluate the vascular anatomy. Carotid stenosis measurements (when  applicable) are obtained utilizing NASCET criteria, using the distal internal carotid diameter as the denominator. RADIATION DOSE REDUCTION: This exam was performed according to the departmental dose-optimization program which includes automated exposure control, adjustment of the mA and/or kV according to patient size and/or use of iterative reconstruction technique. CONTRAST:  60mL OMNIPAQUE IOHEXOL 350 MG/ML SOLN COMPARISON:  CT from earlier the same day. FINDINGS: CTA NECK FINDINGS Aortic arch: Visualized aortic arch within normal limits for caliber. Mild aortic atherosclerosis. Bovine branching pattern noted. No stenosis about the origin the great vessels. Right carotid  system: Right common and internal carotid arteries are patent without stenosis or dissection. Left carotid system: Left common and internal carotid arteries are patent without stenosis or dissection. Vertebral arteries: Both vertebral arteries arise from subclavian arteries. No proximal subclavian artery stenosis. Right vertebral artery slightly dominant. Vertebral arteries are patent without stenosis or dissection. Skeleton: Few scattered lucent l lesions noted within the visualized spine, most pronounced about the cervicothoracic junction at T2 (series 9, image 94). Findings concerning for metastatic disease. Moderate cervical spondylosis with superimposed OPLL at C3-4 through C5-6. Other neck: No other acute finding. Upper chest: Few scattered pulmonary nodules measuring up to 5-6 mm within the lungs, concerning for possible metastatic disease given the history of breast cancer. Asymmetric skin thickening noted about the partially visualized left breast. Review of the MIP images confirms the above findings CTA HEAD FINDINGS Anterior circulation: Mild atheromatous change about the carotid siphons without hemodynamically significant stenosis. 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 A1 segments patent bilaterally. Both ACAs patent without significant stenosis. No M1 stenosis or occlusion. Diffuse small vessel atheromatous irregularity seen throughout the MCA branches bilaterally, with specific note of a severe proximal left M3 stenosis, inferior division (series 8, image 146). Attenuated but patent flow is seen distally. Posterior circulation: Both V4 segments patent without stenosis. Both PICA patent. Basilar patent without stenosis. Superior cerebellar and posterior cerebral arteries patent bilaterally. Venous sinuses: Not well assessed due to timing the contrast bolus. Anatomic variants: None significant. Review of the MIP images confirms the above findings IMPRESSION: 1. Negative CTA for large vessel occlusion or other emergent finding. 2. Diffuse small vessel atheromatous irregularity throughout the MCA branches bilaterally, with specific note of a severe proximal left M3 stenosis, inferior division. 3. 4 mm left PCOM aneurysm. 4. Few scattered lucent lesions within the visualized spine, most pronounced about the cervicothoracic junction at T2. Findings concerning for metastatic disease given the history of breast cancer. 5. Few scattered pulmonary nodules measuring up to 5-6 mm within the lungs, also concerning for possible metastatic disease. 6. Asymmetric skin thickening about the partially visualized left breast, presumably correlating with patient's known malignancy. Correlation with physical exam recommended. 7. Aortic Atherosclerosis (ICD10-I70.0). Preliminary results discussed by telephone at the time of interpretation on 09/14/2023 at 9:25 pm with provider ERIC LINDZEN. Electronically Signed   By: Rise Mu M.D.   On: 09/14/2023 21:44   CT HEAD CODE STROKE WO CONTRAST  Result Date: 09/14/2023 CLINICAL DATA:  Code stroke. Evaluation for neuro deficit, stroke suspected. EXAM: CT HEAD WITHOUT CONTRAST TECHNIQUE: Contiguous axial images were  obtained from the base of the skull through the vertex without intravenous contrast. RADIATION DOSE REDUCTION: This exam was performed according to the departmental dose-optimization program which includes automated exposure control, adjustment of the mA and/or kV according to patient size and/or use of iterative reconstruction technique. COMPARISON:  Prior study from 08/27/2021. FINDINGS: Brain: Cerebral volume within normal limits. Extensive patchy and confluent hypodensity involving the supratentorial cerebral white matter, consistent with chronic small vessel ischemic disease, advanced in nature. Superimposed remote lacunar infarcts present about the deep gray nuclei. Few small remote right cerebellar infarcts. No acute large vessel territory infarct. No acute intracranial hemorrhage. No mass lesion or midline shift. No hydrocephalus or extra-axial fluid collection. Vascular: No abnormal hyperdense vessel. Scattered vascular calcifications noted within the carotid siphons. Skull: Scalp soft tissues within normal limits.  Calvarium intact. Sinuses/Orbits: Globes and  orbital soft tissues demonstrate no acute finding. Paranasal sinuses are largely clear. No significant mastoid effusion. Other: None. ASPECTS [facility] Stroke Program Early CT Score) - Ganglionic level infarction (caudate, lentiform nuclei, internal capsule, insula, M1-M3 cortex): 7 - Supraganglionic infarction (M4-M6 cortex): 3 Total score (0-10 with 10 being normal): 10 IMPRESSION: 1. No acute intracranial abnormality. 2. ASPECTS is 10. 3. Advanced chronic microvascular ischemic disease, with a few small remote right cerebellar infarcts. These results were communicated to Dr. Otelia Limes at 9:04 pm on 09/14/2023 by text page via the [facility] messaging system. Electronically Signed   By: Rise Mu M.D.   On: 09/14/2023 21:06      Assessment/Plan   Principal Problem:   Acute encephalopathy Active Problems:   Malignant neoplasm of  lower-outer quadrant of right breast of female, estrogen receptor positive (HCC)   Hypokalemia   Hypercalcemia   AKI (acute kidney injury) (HCC)      #) Acute encephalopathy: The patient presents with confusion, somnolence, with report of waxing/waning mental status over the last 2 weeks, worse starting on 09/14/2023.  Differential includes potential pharmacologic contribution, noting the presence of as needed oxycodone on her home medications, with associated increased risk for buildup of toxic metabolites as a consequence of diminished renal clearance thereof in the setting of her acute kidney injury.  This potential pharmacologic impact may historically be consistent with her presentation given the waxing/waning nature of her somnolence as conveyed by the patient's daughter.   Additionally, MRI brain was ordered in the ED to evaluate both the possibility of acute ischemic stroke as well as the possibility of metastatic disease to the brain, although it is noted that this study was ordered without contrast.  Will also pursue evaluation for any underlying infectious contribution, noting that urinalysis result is currently pending at this time.  Additionally, urinary drug screen is currently pending.  In the absence of fever in the absence of will with cell count greater than 12,000, SIRS criteria not currently met for sepsis.  Additionally, she appears hemodynamically stable, will refrain from empiric initiation of antibiotics at this time.  Differential also includes potential contribution from hypercalcemia, as outlined below.  Plan: fall precautions. Delirium precautions. Repeat CMP/CBC in the AM. Check Mg level. check TSH, B12 level.  Check ammonia, INR as evaluation for synthetic hepatic function given the metastatic nature of her breast cancer.  In the setting of acute kidney injury, will hold outpatient prn oxycodone for now.  Follow-up result of urinalysis, urinary drug screen.  Follow-up for  result of MRI brain, as above.                  #) Hypokalemia: presenting potassium level noted to be  3.4.    Plan: monitor on tele. KCl 40 meq IV every 4 hours. Add-on serum mag level. CMP, mag level in the AM.                   # (Hypercalcemia: Adjusted calcium level noted to be mildly elevated at 10.9, notable in the context of the patient's acute encephalopathy.  Differential includes potential contribution from dehydration in the context of recent decline in oral intake as a result of the patient's waxing/waning mental status over the course of the last 2 weeks.  Differential also includes potential paraneoplastic contribution, particularly given a history of metastatic breast cancer.  Will pursue overnight IV fluids, with additional evaluation as outlined below, including the possibility for further expansion of workup if  calcium level does not improve with interval IV fluids.  Plan: Continue existing order for lactated Ringer's at 200 cc/h x 5 hours, following which we will transition to lactated Ringer's at 125 cc/h x 8 additional hours.  Monitor strict I's and O's and daily weights.  Add on serum magnesium and phosphorus levels.  Check CMP in the morning.                  #) Acute Kidney Injury: Presenting creatinine noted to be 1.22 compared to most recent prior value of 0.92 on 04/12/2022.  Suspect a prerenal contribution in the context of clinical evidence of dehydration corresponding to recent incline in oral intake as a result of waxing/waning mental status over the preceding 2 days.  Differential also includes potential secondary pharmacologic contribution given increased risk for buildup of nephrotoxic metabolites of oxycodone as a consequence interval decline in renal clearance thereof. differential also includes potential postrenal obstructive contribution given her history of metastatic breast cancer.   Plan: monitor strict I's & O's  and daily weights. Attempt to avoid nephrotoxic agents.  Hold home oxycodone for now.  Refrain from NSAIDs. Repeat CMP in the morning. Check serum magnesium level.  Follow-up result urinalysis with microscopy.  Add-on random urine sodium and random urine creatinine.  Check CPK level. if renal function does not improve with the above measures, can consider obtaining a renal US to evaluate for parenchymal abnormality as well as to assess for evidence of post-renal obstructive process.                 #) Metastatic breast cancer: Documented history of such, for which the patient is not receiving any chemo/radiation, but rather is on hospice/palliative care via [facility], with the patient's daughter confirming this evening that the patient would not want life preserving interventions, also confirming DNR, DNI status, but noting that care directed at maintaining the patient's comfort is acceptable, as further detailed above.  Daughter conveys that [facility] has been notified of the patient's presentation to the hospital.  In the setting of the patient's limited ability to tolerate p.o. at this time as well as in the setting of her acute kidney injury, will hold outpatient prn oxycodone for now, while pursuing prn IV morphine.   Plan: Hold home prn oxycodone for now, as above.  As needed IV morphine.     DVT prophylaxis: SCD's   Code Status: DNR/DNI, as confirmed by daughter, as further detailed above Family Communication: d/w daughter, as above Disposition Plan: Per Rounding Team Consults called: in setting of initial code status presentation, EDP d/w on-call neurology, who will consult;  Admission status: inpatient     I SPENT GREATER THAN 75  MINUTES IN CLINICAL CARE TIME/MEDICAL DECISION-MAKING IN COMPLETING THIS ADMISSION.      Chaney Born Jacquelyn Antony DO [facility]  From 7PM - 7AM   09/15/2023, 1:40 AM

## 2023-09-16 DIAGNOSIS — G9340 Encephalopathy, unspecified: Secondary | ICD-10-CM | POA: Diagnosis not present

## 2023-09-16 LAB — CBC
HCT: 35.5 % — ABNORMAL LOW (ref 36.0–46.0)
Hemoglobin: 11.7 g/dL — ABNORMAL LOW (ref 12.0–15.0)
MCH: 29.9 pg (ref 26.0–34.0)
MCHC: 33 g/dL (ref 30.0–36.0)
MCV: 90.8 fL (ref 80.0–100.0)
Platelets: 254 10*3/uL (ref 150–400)
RBC: 3.91 MIL/uL (ref 3.87–5.11)
RDW: 13.5 % (ref 11.5–15.5)
WBC: 7.9 10*3/uL (ref 4.0–10.5)
nRBC: 0 % (ref 0.0–0.2)

## 2023-09-16 LAB — BASIC METABOLIC PANEL
Anion gap: 12 (ref 5–15)
BUN: 14 mg/dL (ref 8–23)
CO2: 22 mmol/L (ref 22–32)
Calcium: 9.8 mg/dL (ref 8.9–10.3)
Chloride: 104 mmol/L (ref 98–111)
Creatinine, Ser: 0.71 mg/dL (ref 0.44–1.00)
GFR, Estimated: >60 mL/min (ref >60)
Glucose, Bld: 90 mg/dL (ref 70–99)
Potassium: 4.1 mmol/L (ref 3.5–5.1)
Sodium: 138 mmol/L (ref 135–145)

## 2023-09-16 LAB — MAGNESIUM: Magnesium: 1.8 mg/dL (ref 1.7–2.4)

## 2023-09-16 NOTE — Plan of Care (Signed)
  Problem: Activity: Goal: Risk for activity intolerance will decrease Outcome: Progressing   

## 2023-09-16 NOTE — Plan of Care (Signed)
  Problem: Education: Goal: Knowledge of General Education information will improve Description Including pain rating scale, medication(s)/side effects and non-pharmacologic comfort measures Outcome: Progressing   

## 2023-09-16 NOTE — Discharge Summary (Signed)
Physician Discharge Summary  [phone] TRUE Shackleford HYQ:[id number] DOB: January 01, 1946 DOA: 09/14/2023  PCP: [facility]  Admit date: 09/14/2023 Discharge date: 09/16/2023  Admitted From: Home with hospice Discharge disposition: Back to home with hospice  Brief narrative: Sheila Perez Sheila Perez is a 77 y.o. female with PMH significant for DM2, HTN, HLD, stroke, bilateral breast cancer, currently under outpatient hospice without further care 11/1, patient was brought to the ED with altered mental status which has been fluctuating for past 2 weeks. CT head, CTA head and neck are negative for acute pathology.   MRI brain is negative for acute pathology but shows chronic advance microvascular ischemic changes, chronic microhemorrhages consistent with possible some sort of angiopathy, 1.1 cm left frontal calvarium lesion possibly osseous metastases.   Patient's family wanted a workup to address reversible causes Admitted to [facility] Neurology was consulted. Hospice team was reengaged.  Subjective: Patient was seen and examined this morning.  Pleasant elderly African-American.  Sitting up in bed.  Not in distress.  Daughter at bedside. In the last 24 hours, afebrile, heart rate 90s, blood pressure 160s 170s, breathing on room air CMP and BMP this morning unremarkable Patient and family are ready for discharge back to home today.  Hospital course: Acute encephalopathy Patient has been on hospice care for about a year but has had a recent decline in the mental status and mobility. Patient is lethargic, unable to follow commands and this is overall her baseline. Per report, patient's daughter still wanted to treat any reversible condition and hence the admission. No acute ischemic changes seen on the CT head, MRI of the brain but does show significant chronic changes with also likely osseous metastatic disease.   Neurology recommended aspirin and statin.  I discussed these with  patient's daughter.  She recalls that she was told in the past to avoid any blood thinner because of the risk of bleeding.  Given patient's hospice status, I would avoid any aspirin or statin at this time. Ammonia level normal Urinalysis with hazy yellow urine, no evidence of infection. Mental status is back to normal.  Stable to discharge.  Hypokalemia/hypocalcemia Monitor and replete electrolytes as needed Recent Labs  Lab 09/14/23 2054 09/14/23 2100 09/15/23 0555 09/16/23 0449  K 3.5 3.4* 4.3 4.1  MG  --  1.9 1.9 1.8  PHOS  --   --  5.0*  --    Breast cancer with metastatic disease Was diagnosed in 2023.  No longer any acute treatment.   Continue pain control  Hospice team was reengaged.  Wounds:  - Pressure Injury 09/15/23 Sacrum Medial Stage 1 -  Intact skin with non-blanchable redness of a localized area usually over a bony prominence. (Active)  Date First Assessed/Time First Assessed: 09/15/23 0306   Location: Sacrum  Location Orientation: Medial  Staging: Stage 1 -  Intact skin with non-blanchable redness of a localized area usually over a bony prominence.  Present on Admission: Yes    Assessments 09/15/2023  2:19 AM 09/16/2023  8:01 AM  Dressing Type Foam - Lift dressing to assess site every shift Foam - Lift dressing to assess site every shift  Dressing -- Clean, Dry, Intact  Drainage Amount -- None     No associated orders.    Discharge Exam:   Vitals:   09/16/23 0352 09/16/23 0526 09/16/23 0818 09/16/23 1144  BP: (!) 171/84  (!) 172/90 (!) 174/87  Pulse: 84  97 98  Resp: 17  20 19   Temp: 97.7 F (  36.5 C)  98.3 F (36.8 C) 97.7 F (36.5 C)  TempSrc: Oral  Oral Oral  SpO2: 100%  99% 100%  Weight:  68.5 kg    Height:        Body mass index is 27.62 kg/m.  General exam: Pleasant, elderly.  Not in distress Skin: No rashes, lesions or ulcers. HEENT: Atraumatic, normocephalic, no obvious bleeding Lungs: Clear to auscultation bilaterally CVS: Regular rate  and rhythm, normal GI/Abd soft, nontender, nondistended, bowel sound present CNS: Alert, awake, alert x 3 Psychiatry: Mood appropriate Extremities: No pedal edema, no calf tenderness  Follow ups:    Follow-up Information     [facility] Follow up.   Contact information: [address] [place] [address] [phone]                 Discharge Instructions:   Discharge Instructions     Call MD for:  difficulty breathing, headache or visual disturbances   Complete by: As directed    Call MD for:  extreme fatigue   Complete by: As directed    Call MD for:  hives   Complete by: As directed    Call MD for:  persistant dizziness or light-headedness   Complete by: As directed    Call MD for:  persistant nausea and vomiting   Complete by: As directed    Call MD for:  severe uncontrolled pain   Complete by: As directed    Call MD for:  temperature >100.4   Complete by: As directed    Diet general   Complete by: As directed    Discharge instructions   Complete by: As directed    General discharge instructions: Follow with Primary MD [facility] in 7 days  Please request your PCP  to go over your hospital tests, procedures, radiology results at the follow up. Please get your medicines reviewed and adjusted.  Your PCP may decide to repeat certain labs or tests as needed. Do not drive, operate heavy machinery, perform activities at heights, swimming or participation in water activities or provide baby sitting services if your were admitted for syncope or siezures until you have seen by Primary MD or a Neurologist and advised to do so again. [place] Controlled Substance Reporting System database was reviewed. Do not drive, operate heavy machinery, perform activities at heights, swim, participate in water activities or provide baby-sitting services while on medications for pain, sleep and mood until your outpatient physician has reevaluated you and  advised to do so again.  You are strongly recommended to comply with the dose, frequency and duration of prescribed medications. Activity: As tolerated with Full fall precautions use walker/cane & assistance as needed Avoid using any recreational substances like cigarette, tobacco, alcohol, or non-prescribed drug. If you experience worsening of your admission symptoms, develop shortness of breath, life threatening emergency, suicidal or homicidal thoughts you must seek medical attention immediately by calling 911 or calling your MD immediately  if symptoms less severe. You must read complete instructions/literature along with all the possible adverse reactions/side effects for all the medicines you take and that have been prescribed to you. Take any new medicine only after you have completely understood and accepted all the possible adverse reactions/side effects.  Wear Seat belts while driving. You were cared for by a hospitalist during your hospital stay. If you have any questions about your discharge medications or the care you received while you were in the hospital after you are discharged, you  can call the unit and ask to speak with the hospitalist or the covering physician. Once you are discharged, your primary care physician will handle any further medical issues. Please note that NO REFILLS for any discharge medications will be authorized once you are discharged, as it is imperative that you return to your primary care physician (or establish a relationship with a primary care physician if you do not have one).   Discharge wound care:   Complete by: As directed    Increase activity slowly   Complete by: As directed        Discharge Medications:   Allergies as of 09/16/2023   No Known Allergies      Medication List     TAKE these medications    cholecalciferol 25 MCG (1000 UNIT) tablet Commonly known as: VITAMIN D3 Take 1,000 Units by mouth daily.   morphine CONCENTRATE 10 mg /  0.5 ml concentrated solution Take 20 mg by mouth.   OVER THE COUNTER MEDICATION Take 1 capsule by mouth 2 (two) times daily. [place] tail mushrooms   oxyCODONE 5 MG immediate release tablet Commonly known as: Oxy IR/ROXICODONE Take 5 mg by mouth every 4 (four) hours as needed.   sorbitol 70 % Soln Take 30 mLs by mouth daily as needed for mild constipation or moderate constipation.   Stimulant Laxative 8.6-50 MG tablet Generic drug: senna-docusate Take 2 tablets by mouth 2 (two) times daily.               Discharge Care Instructions  (From admission, onward)           Start     Ordered   09/16/23 0000  Discharge wound care:        09/16/23 1118             The results of significant diagnostics from this hospitalization (including imaging, microbiology, ancillary and laboratory) are listed below for reference.    Procedures and Diagnostic Studies:   MR Brain Wo Contrast (neuro protocol)  Result Date: 09/15/2023 CLINICAL DATA:  Initial evaluation for neuro deficit, stroke suspected. EXAM: MRI HEAD WITHOUT CONTRAST TECHNIQUE: Multiplanar, multiecho pulse sequences of the brain and surrounding structures were obtained without intravenous contrast. COMPARISON:  Prior CTs from 09/14/2023. FINDINGS: Brain: Cerebral volume within normal limits for age. Confluent T2/FLAIR hyperintensity involving the periventricular and deep white matter both cerebral hemispheres, consistent with chronic small vessel ischemic disease, fairly advanced in nature. Patchy involvement of the pons noted as well. Multiple scattered remote lacunar infarcts present about the bilateral basal ganglia, thalami, and pons. Small remote right cerebellar infarcts. No abnormal foci of restricted diffusion to suggest acute or subacute ischemia. Gray-white matter differentiation maintained. No other areas of chronic cortical infarction. No acute intracranial hemorrhage. Innumerable chronic micro hemorrhages are  seen involving both cerebral and cerebellar hemispheres, with additional involvement of the brainstem. Findings are nonspecific. No mass lesion or midline shift. No hydrocephalus or extra-axial fluid collection. Pituitary gland and suprasellar region within normal limits. Vascular: Major intracranial vascular flow voids are maintained. Skull and upper cervical spine: Craniocervical junction within normal limits. Heterogeneous signal intensity seen throughout the visualized bone marrow. 1.1 cm lesion present at the left frontal calvarium, nonspecific, but could potentially reflect an osseous metastasis given findings in the spine seen on prior CTA. No scalp soft tissue abnormality. Sinuses/Orbits: Globes and orbital soft tissues within normal limits. Paranasal sinuses are largely clear. No mastoid effusion. Other: None. IMPRESSION: 1. No acute intracranial abnormality.  2. Advanced chronic microvascular ischemic disease with multiple scattered remote lacunar infarcts involving the bilateral basal ganglia, thalami, and pons. 3. Innumerable chronic micro hemorrhages throughout the brain. Findings are nonspecific, and could reflect changes of cerebral amyloid angiopathy or chronic poorly controlled hypertension. 4. 1.1 cm lesion at the left frontal calvarium, nonspecific, but could potentially reflect an osseous metastasis given previously seen findings in the spine on prior CTA. Attention at follow-up recommended. Electronically Signed   By: Rise Mu M.D.   On: 09/15/2023 01:51   CT ANGIO HEAD NECK W WO CM (CODE STROKE)  Result Date: 09/14/2023 CLINICAL DATA:  Initial evaluation for neuro deficit, stroke suspected. No other relevant history is provided. EXAM: CT ANGIOGRAPHY HEAD AND NECK WITH AND WITHOUT CONTRAST TECHNIQUE: Multidetector CT imaging of the head and neck was performed using the standard protocol during bolus administration of intravenous contrast. Multiplanar CT image reconstructions and  MIPs were obtained to evaluate the vascular anatomy. Carotid stenosis measurements (when applicable) are obtained utilizing NASCET criteria, using the distal internal carotid diameter as the denominator. RADIATION DOSE REDUCTION: This exam was performed according to the departmental dose-optimization program which includes automated exposure control, adjustment of the mA and/or kV according to patient size and/or use of iterative reconstruction technique. CONTRAST:  60mL OMNIPAQUE IOHEXOL 350 MG/ML SOLN COMPARISON:  CT from earlier the same day. FINDINGS: CTA NECK FINDINGS Aortic arch: Visualized aortic arch within normal limits for caliber. Mild aortic atherosclerosis. Bovine branching pattern noted. No stenosis about the origin the great vessels. Right carotid system: Right common and internal carotid arteries are patent without stenosis or dissection. Left carotid system: Left common and internal carotid arteries are patent without stenosis or dissection. Vertebral arteries: Both vertebral arteries arise from subclavian arteries. No proximal subclavian artery stenosis. Right vertebral artery slightly dominant. Vertebral arteries are patent without stenosis or dissection. Skeleton: Few scattered lucent l lesions noted within the visualized spine, most pronounced about the cervicothoracic junction at T2 (series 9, image 94). Findings concerning for metastatic disease. Moderate cervical spondylosis with superimposed OPLL at C3-4 through C5-6. Other neck: No other acute finding. Upper chest: Few scattered pulmonary nodules measuring up to 5-6 mm within the lungs, concerning for possible metastatic disease given the history of breast cancer. Asymmetric skin thickening noted about the partially visualized left breast. Review of the MIP images confirms the above findings CTA HEAD FINDINGS Anterior circulation: Mild atheromatous change about the carotid siphons without hemodynamically significant stenosis. 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 A1 segments patent bilaterally. Both ACAs patent without significant stenosis. No M1 stenosis or occlusion. Diffuse small vessel atheromatous irregularity seen throughout the MCA branches bilaterally, with specific note of a severe proximal left M3 stenosis, inferior division (series 8, image 146). Attenuated but patent flow is seen distally. Posterior circulation: Both V4 segments patent without stenosis. Both PICA patent. Basilar patent without stenosis. Superior cerebellar and posterior cerebral arteries patent bilaterally. Venous sinuses: Not well assessed due to timing the contrast bolus. Anatomic variants: None significant. Review of the MIP images confirms the above findings IMPRESSION: 1. Negative CTA for large vessel occlusion or other emergent finding. 2. Diffuse small vessel atheromatous irregularity throughout the MCA branches bilaterally, with specific note of a severe proximal left M3 stenosis, inferior division. 3. 4 mm left PCOM aneurysm. 4. Few scattered lucent lesions within the visualized spine, most pronounced about the cervicothoracic junction at T2. Findings concerning  for metastatic disease given the history of breast cancer. 5. Few scattered pulmonary nodules measuring up to 5-6 mm within the lungs, also concerning for possible metastatic disease. 6. Asymmetric skin thickening about the partially visualized left breast, presumably correlating with patient's known malignancy. Correlation with physical exam recommended. 7. Aortic Atherosclerosis (ICD10-I70.0). Preliminary results discussed by telephone at the time of interpretation on 09/14/2023 at 9:25 pm with provider ERIC LINDZEN. Electronically Signed   By: Rise Mu M.D.   On: 09/14/2023 21:44   CT HEAD CODE STROKE WO CONTRAST  Result Date: 09/14/2023 CLINICAL DATA:  Code stroke. Evaluation for neuro deficit, stroke  suspected. EXAM: CT HEAD WITHOUT CONTRAST TECHNIQUE: Contiguous axial images were obtained from the base of the skull through the vertex without intravenous contrast. RADIATION DOSE REDUCTION: This exam was performed according to the departmental dose-optimization program which includes automated exposure control, adjustment of the mA and/or kV according to patient size and/or use of iterative reconstruction technique. COMPARISON:  Prior study from 08/27/2021. FINDINGS: Brain: Cerebral volume within normal limits. Extensive patchy and confluent hypodensity involving the supratentorial cerebral white matter, consistent with chronic small vessel ischemic disease, advanced in nature. Superimposed remote lacunar infarcts present about the deep gray nuclei. Few small remote right cerebellar infarcts. No acute large vessel territory infarct. No acute intracranial hemorrhage. No mass lesion or midline shift. No hydrocephalus or extra-axial fluid collection. Vascular: No abnormal hyperdense vessel. Scattered vascular calcifications noted within the carotid siphons. Skull: Scalp soft tissues within normal limits.  Calvarium intact. Sinuses/Orbits: Globes and orbital soft tissues demonstrate no acute finding. Paranasal sinuses are largely clear. No significant mastoid effusion. Other: None. ASPECTS [facility] Stroke Program Early CT Score) - Ganglionic level infarction (caudate, lentiform nuclei, internal capsule, insula, M1-M3 cortex): 7 - Supraganglionic infarction (M4-M6 cortex): 3 Total score (0-10 with 10 being normal): 10 IMPRESSION: 1. No acute intracranial abnormality. 2. ASPECTS is 10. 3. Advanced chronic microvascular ischemic disease, with a few small remote right cerebellar infarcts. These results were communicated to Dr. Otelia Limes at 9:04 pm on 09/14/2023 by text page via the [facility] messaging system. Electronically Signed   By: Rise Mu M.D.   On: 09/14/2023 21:06     Labs:   Basic Metabolic  Panel: Recent Labs  Lab 09/14/23 2054 09/14/23 2100 09/15/23 0555 09/16/23 0449  NA 136 137 135 138  K 3.5 3.4* 4.3 4.1  CL 104 103 100 104  CO2  --  20* 25 22  GLUCOSE 156* 154* 93 90  BUN 24* 20 18 14   CREATININE 1.10* 1.22* 0.97 0.71  CALCIUM  --  10.5* 10.4* 9.8  MG  --  1.9 1.9 1.8  PHOS  --   --  5.0*  --    GFR Estimated Creatinine Clearance: 56 mL/min (by C-G formula based on SCr of 0.71 mg/dL). Liver Function Tests: Recent Labs  Lab 09/14/23 2100 09/15/23 0555  AST 23 23  ALT 12 14  ALKPHOS 70 77  BILITOT 0.8 0.7  PROT 7.0 7.4  ALBUMIN 3.4* 3.4*   No results for input(s): "LIPASE", "AMYLASE" in the last 168 hours. Recent Labs  Lab 09/15/23 1122  AMMONIA 31   Coagulation profile Recent Labs  Lab 09/15/23 0555  INR 1.1    CBC: Recent Labs  Lab 09/14/23 2054 09/14/23 2100 09/15/23 0555 09/16/23 0449  WBC  --  11.3* 11.8* 7.9  NEUTROABS  --  8.3* 8.5*  --   HGB 14.6 13.6 14.0 11.7*  HCT 43.0  43.3 43.1 35.5*  MCV  --  93.9 91.5 90.8  PLT  --  274 305 254   Cardiac Enzymes: Recent Labs  Lab 09/15/23 0555  CKTOTAL 84   BNP: Invalid input(s): "POCBNP" CBG: No results for input(s): "GLUCAP" in the last 168 hours. D-Dimer No results for input(s): "DDIMER" in the last 72 hours. Hgb A1c Recent Labs    09/15/23 0555  HGBA1C 5.8*   Lipid Profile Recent Labs    09/15/23 0555  CHOL 230*  HDL 34*  LDLCALC 178*  TRIG 89  CHOLHDL 6.8   Thyroid function studies Recent Labs    09/15/23 0555  TSH 1.489   Anemia work up Recent Labs    09/15/23 0555  VITAMINB12 1,687*   Microbiology No results found for this or any previous visit (from the past 240 hour(s)).  Time coordinating discharge: 25 minutes  Signed: Krupa Stege  [facility] 09/16/2023, 12:38 PM

## 2023-09-16 NOTE — Progress Notes (Signed)
Pt. Awaiting D/C home with family, education provided to Pt. & daughter.

## 2023-09-16 NOTE — TOC Transition Note (Addendum)
Transition of Care [facility]) - CM/SW Discharge Note   Patient Details  Name: Sheila Perez MRN: [id number] Date of Birth: 06/15/1946  [facility] of [facility] CM/SW Contact:  Lawerance Sabal, RN Phone Number: 09/16/2023, 11:25 AM   Clinical Narrative:     Patient active w [facility] prior to admission, liaison has been notified of DC planned for today  Spoke w daughter in room, confirmed home address, she requestes DC as soon as possible.  Patient will require non emergency transportation through [facility] (active w [facility] P[facility]) [facility] has been called for pickup around 1pm, coordinated w bedside nurse.   Final next level of care: Home/Self Care Barriers to Discharge: No Barriers Identified   Patient Goals and CMS Choice      Discharge Placement                         Discharge Plan and Services Additional resources added to the After Visit Summary for                              [facility] Agency: [facility] Date [facility] Agency Contacted: 09/16/23 Time HH Agency Contacted: 1124 Representative spoke with at [facility] Agency: Doreatha Martin  Social Determinants of Health (SDOH) Interventions SDOH Screenings   Food Insecurity: No Food Insecurity (04/03/2022)  Housing: Low Risk  (04/19/2021)  Transportation Needs: No Transportation Needs (04/03/2022)  Alcohol Screen: Low Risk  (04/19/2021)  Depression (PHQ2-9): Low Risk  (04/03/2022)  Financial Resource Strain: Low Risk  (04/03/2022)  Physical Activity: Inactive (04/03/2022)  Social Connections: Socially Isolated (04/19/2021)  Stress: No Stress Concern Present (04/03/2022)  Tobacco Use: Low Risk  (09/15/2023)     Readmission Risk Interventions     No data to display

## 2023-09-16 NOTE — Progress Notes (Signed)
Physician Discharge Summary  [phone] Sheila Perez VWU:[id number] DOB: Nov 22, 1945 DOA: 09/14/2023  PCP: [facility]  Admit date: 09/14/2023 Discharge date: 09/16/2023  Admitted From: Home with hospice Discharge disposition: Back to home with hospice  Brief narrative: GNFAOZHY Sheila Perez is a 77 y.o. female with PMH significant for DM2, HTN, HLD, stroke, bilateral breast cancer, currently under outpatient hospice without further care 11/1, patient was brought to the ED with altered mental status which has been fluctuating for past 2 weeks. CT head, CTA head and neck are negative for acute pathology.   MRI brain is negative for acute pathology but shows chronic advance microvascular ischemic changes, chronic microhemorrhages consistent with possible some sort of angiopathy, 1.1 cm left frontal calvarium lesion possibly osseous metastases.   Patient's family wanted a workup to address reversible causes Admitted to [facility] Neurology was consulted. Hospice team was reengaged.  Subjective: Patient was seen and examined this morning.  Pleasant elderly African-American.  Sitting up in bed.  Not in distress.  Daughter at bedside. In the last 24 hours, afebrile, heart rate 90s, blood pressure 160s 170s, breathing on room air CMP and BMP this morning unremarkable Patient and family are ready for discharge back to home today.  Hospital course: Acute encephalopathy Patient has been on hospice care for about a year but has had a recent decline in the mental status and mobility. Patient is lethargic, unable to follow commands and this is overall her baseline. Per report, patient's daughter still wanted to treat any reversible condition and hence the admission. No acute ischemic changes seen on the CT head, MRI of the brain but does show significant chronic changes with also likely osseous metastatic disease.   Neurology recommended aspirin and statin.  I discussed these with  patient's daughter.  She recalls that she was told in the past to avoid any blood thinner because of the risk of bleeding.  Given patient's hospice status, I would avoid any aspirin or statin at this time. Ammonia level normal Urinalysis with hazy yellow urine, no evidence of infection. Mental status is back to normal.  Stable to discharge.  Hypokalemia/hypocalcemia Monitor and replete electrolytes as needed Recent Labs  Lab 09/14/23 2054 09/14/23 2100 09/15/23 0555 09/16/23 0449  K 3.5 3.4* 4.3 4.1  MG  --  1.9 1.9 1.8  PHOS  --   --  5.0*  --    Breast cancer with metastatic disease Was diagnosed in 2023.  No longer any acute treatment.   Continue pain control  Hospice team was reengaged.  Wounds:  - Pressure Injury 09/15/23 Sacrum Medial Stage 1 -  Intact skin with non-blanchable redness of a localized area usually over a bony prominence. (Active)  Date First Assessed/Time First Assessed: 09/15/23 0306   Location: Sacrum  Location Orientation: Medial  Staging: Stage 1 -  Intact skin with non-blanchable redness of a localized area usually over a bony prominence.  Present on Admission: Yes    Assessments 09/15/2023  2:19 AM 09/16/2023  8:01 AM  Dressing Type Foam - Lift dressing to assess site every shift Foam - Lift dressing to assess site every shift  Dressing -- Clean, Dry, Intact  Drainage Amount -- None     No associated orders.    Discharge Exam:   Vitals:   09/15/23 2351 09/16/23 0352 09/16/23 0526 09/16/23 0818  BP: (!) 162/88 (!) 171/84  (!) 172/90  Pulse: 90 84  97  Resp: 18 17  20   Temp: 98.1 F (  36.7 C) 97.7 F (36.5 C)  98.3 F (36.8 C)  TempSrc: Oral Oral  Oral  SpO2: 99% 100%  99%  Weight:   68.5 kg   Height:        Body mass index is 27.62 kg/m.  General exam: Pleasant, elderly.  Not in distress Skin: No rashes, lesions or ulcers. HEENT: Atraumatic, normocephalic, no obvious bleeding Lungs: Clear to auscultation bilaterally CVS: Regular rate  and rhythm, normal GI/Abd soft, nontender, nondistended, bowel sound present CNS: Alert, awake, alert x 3 Psychiatry: Mood appropriate Extremities: No pedal edema, no calf tenderness  Follow ups:    Follow-up Information     [facility] Follow up.   Contact information: [address] [place] [address] [phone]                 Discharge Instructions:   Discharge Instructions     Call MD for:  difficulty breathing, headache or visual disturbances   Complete by: As directed    Call MD for:  extreme fatigue   Complete by: As directed    Call MD for:  hives   Complete by: As directed    Call MD for:  persistant dizziness or light-headedness   Complete by: As directed    Call MD for:  persistant nausea and vomiting   Complete by: As directed    Call MD for:  severe uncontrolled pain   Complete by: As directed    Call MD for:  temperature >100.4   Complete by: As directed    Diet general   Complete by: As directed    Discharge instructions   Complete by: As directed    General discharge instructions: Follow with Primary MD [facility] in 7 days  Please request your PCP  to go over your hospital tests, procedures, radiology results at the follow up. Please get your medicines reviewed and adjusted.  Your PCP may decide to repeat certain labs or tests as needed. Do not drive, operate heavy machinery, perform activities at heights, swimming or participation in water activities or provide baby sitting services if your were admitted for syncope or siezures until you have seen by Primary MD or a Neurologist and advised to do so again. [place] Controlled Substance Reporting System database was reviewed. Do not drive, operate heavy machinery, perform activities at heights, swim, participate in water activities or provide baby-sitting services while on medications for pain, sleep and mood until your outpatient physician has reevaluated you and  advised to do so again.  You are strongly recommended to comply with the dose, frequency and duration of prescribed medications. Activity: As tolerated with Full fall precautions use walker/cane & assistance as needed Avoid using any recreational substances like cigarette, tobacco, alcohol, or non-prescribed drug. If you experience worsening of your admission symptoms, develop shortness of breath, life threatening emergency, suicidal or homicidal thoughts you must seek medical attention immediately by calling 911 or calling your MD immediately  if symptoms less severe. You must read complete instructions/literature along with all the possible adverse reactions/side effects for all the medicines you take and that have been prescribed to you. Take any new medicine only after you have completely understood and accepted all the possible adverse reactions/side effects.  Wear Seat belts while driving. You were cared for by a hospitalist during your hospital stay. If you have any questions about your discharge medications or the care you received while you were in the hospital after you are discharged, you  can call the unit and ask to speak with the hospitalist or the covering physician. Once you are discharged, your primary care physician will handle any further medical issues. Please note that NO REFILLS for any discharge medications will be authorized once you are discharged, as it is imperative that you return to your primary care physician (or establish a relationship with a primary care physician if you do not have one).   Discharge wound care:   Complete by: As directed    Increase activity slowly   Complete by: As directed        Discharge Medications:   Allergies as of 09/16/2023   No Known Allergies      Medication List     TAKE these medications    cholecalciferol 25 MCG (1000 UNIT) tablet Commonly known as: VITAMIN D3 Take 1,000 Units by mouth daily.   morphine CONCENTRATE 10 mg /  0.5 ml concentrated solution Take 20 mg by mouth.   OVER THE COUNTER MEDICATION Take 1 capsule by mouth 2 (two) times daily. [place] tail mushrooms   oxyCODONE 5 MG immediate release tablet Commonly known as: Oxy IR/ROXICODONE Take 5 mg by mouth every 4 (four) hours as needed.   sorbitol 70 % Soln Take 30 mLs by mouth daily as needed for mild constipation or moderate constipation.   Stimulant Laxative 8.6-50 MG tablet Generic drug: senna-docusate Take 2 tablets by mouth 2 (two) times daily.               Discharge Care Instructions  (From admission, onward)           Start     Ordered   09/16/23 0000  Discharge wound care:        09/16/23 1118             The results of significant diagnostics from this hospitalization (including imaging, microbiology, ancillary and laboratory) are listed below for reference.    Procedures and Diagnostic Studies:   MR Brain Wo Contrast (neuro protocol)  Result Date: 09/15/2023 CLINICAL DATA:  Initial evaluation for neuro deficit, stroke suspected. EXAM: MRI HEAD WITHOUT CONTRAST TECHNIQUE: Multiplanar, multiecho pulse sequences of the brain and surrounding structures were obtained without intravenous contrast. COMPARISON:  Prior CTs from 09/14/2023. FINDINGS: Brain: Cerebral volume within normal limits for age. Confluent T2/FLAIR hyperintensity involving the periventricular and deep white matter both cerebral hemispheres, consistent with chronic small vessel ischemic disease, fairly advanced in nature. Patchy involvement of the pons noted as well. Multiple scattered remote lacunar infarcts present about the bilateral basal ganglia, thalami, and pons. Small remote right cerebellar infarcts. No abnormal foci of restricted diffusion to suggest acute or subacute ischemia. Gray-white matter differentiation maintained. No other areas of chronic cortical infarction. No acute intracranial hemorrhage. Innumerable chronic micro hemorrhages are  seen involving both cerebral and cerebellar hemispheres, with additional involvement of the brainstem. Findings are nonspecific. No mass lesion or midline shift. No hydrocephalus or extra-axial fluid collection. Pituitary gland and suprasellar region within normal limits. Vascular: Major intracranial vascular flow voids are maintained. Skull and upper cervical spine: Craniocervical junction within normal limits. Heterogeneous signal intensity seen throughout the visualized bone marrow. 1.1 cm lesion present at the left frontal calvarium, nonspecific, but could potentially reflect an osseous metastasis given findings in the spine seen on prior CTA. No scalp soft tissue abnormality. Sinuses/Orbits: Globes and orbital soft tissues within normal limits. Paranasal sinuses are largely clear. No mastoid effusion. Other: None. IMPRESSION: 1. No acute intracranial abnormality.  2. Advanced chronic microvascular ischemic disease with multiple scattered remote lacunar infarcts involving the bilateral basal ganglia, thalami, and pons. 3. Innumerable chronic micro hemorrhages throughout the brain. Findings are nonspecific, and could reflect changes of cerebral amyloid angiopathy or chronic poorly controlled hypertension. 4. 1.1 cm lesion at the left frontal calvarium, nonspecific, but could potentially reflect an osseous metastasis given previously seen findings in the spine on prior CTA. Attention at follow-up recommended. Electronically Signed   By: Rise Mu M.D.   On: 09/15/2023 01:51   CT ANGIO HEAD NECK W WO CM (CODE STROKE)  Result Date: 09/14/2023 CLINICAL DATA:  Initial evaluation for neuro deficit, stroke suspected. No other relevant history is provided. EXAM: CT ANGIOGRAPHY HEAD AND NECK WITH AND WITHOUT CONTRAST TECHNIQUE: Multidetector CT imaging of the head and neck was performed using the standard protocol during bolus administration of intravenous contrast. Multiplanar CT image reconstructions and  MIPs were obtained to evaluate the vascular anatomy. Carotid stenosis measurements (when applicable) are obtained utilizing NASCET criteria, using the distal internal carotid diameter as the denominator. RADIATION DOSE REDUCTION: This exam was performed according to the departmental dose-optimization program which includes automated exposure control, adjustment of the mA and/or kV according to patient size and/or use of iterative reconstruction technique. CONTRAST:  60mL OMNIPAQUE IOHEXOL 350 MG/ML SOLN COMPARISON:  CT from earlier the same day. FINDINGS: CTA NECK FINDINGS Aortic arch: Visualized aortic arch within normal limits for caliber. Mild aortic atherosclerosis. Bovine branching pattern noted. No stenosis about the origin the great vessels. Right carotid system: Right common and internal carotid arteries are patent without stenosis or dissection. Left carotid system: Left common and internal carotid arteries are patent without stenosis or dissection. Vertebral arteries: Both vertebral arteries arise from subclavian arteries. No proximal subclavian artery stenosis. Right vertebral artery slightly dominant. Vertebral arteries are patent without stenosis or dissection. Skeleton: Few scattered lucent l lesions noted within the visualized spine, most pronounced about the cervicothoracic junction at T2 (series 9, image 94). Findings concerning for metastatic disease. Moderate cervical spondylosis with superimposed OPLL at C3-4 through C5-6. Other neck: No other acute finding. Upper chest: Few scattered pulmonary nodules measuring up to 5-6 mm within the lungs, concerning for possible metastatic disease given the history of breast cancer. Asymmetric skin thickening noted about the partially visualized left breast. Review of the MIP images confirms the above findings CTA HEAD FINDINGS Anterior circulation: Mild atheromatous change about the carotid siphons without hemodynamically significant stenosis. 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 A1 segments patent bilaterally. Both ACAs patent without significant stenosis. No M1 stenosis or occlusion. Diffuse small vessel atheromatous irregularity seen throughout the MCA branches bilaterally, with specific note of a severe proximal left M3 stenosis, inferior division (series 8, image 146). Attenuated but patent flow is seen distally. Posterior circulation: Both V4 segments patent without stenosis. Both PICA patent. Basilar patent without stenosis. Superior cerebellar and posterior cerebral arteries patent bilaterally. Venous sinuses: Not well assessed due to timing the contrast bolus. Anatomic variants: None significant. Review of the MIP images confirms the above findings IMPRESSION: 1. Negative CTA for large vessel occlusion or other emergent finding. 2. Diffuse small vessel atheromatous irregularity throughout the MCA branches bilaterally, with specific note of a severe proximal left M3 stenosis, inferior division. 3. 4 mm left PCOM aneurysm. 4. Few scattered lucent lesions within the visualized spine, most pronounced about the cervicothoracic junction at T2. Findings concerning  for metastatic disease given the history of breast cancer. 5. Few scattered pulmonary nodules measuring up to 5-6 mm within the lungs, also concerning for possible metastatic disease. 6. Asymmetric skin thickening about the partially visualized left breast, presumably correlating with patient's known malignancy. Correlation with physical exam recommended. 7. Aortic Atherosclerosis (ICD10-I70.0). Preliminary results discussed by telephone at the time of interpretation on 09/14/2023 at 9:25 pm with provider ERIC LINDZEN. Electronically Signed   By: Rise Mu M.D.   On: 09/14/2023 21:44   CT HEAD CODE STROKE WO CONTRAST  Result Date: 09/14/2023 CLINICAL DATA:  Code stroke. Evaluation for neuro deficit, stroke  suspected. EXAM: CT HEAD WITHOUT CONTRAST TECHNIQUE: Contiguous axial images were obtained from the base of the skull through the vertex without intravenous contrast. RADIATION DOSE REDUCTION: This exam was performed according to the departmental dose-optimization program which includes automated exposure control, adjustment of the mA and/or kV according to patient size and/or use of iterative reconstruction technique. COMPARISON:  Prior study from 08/27/2021. FINDINGS: Brain: Cerebral volume within normal limits. Extensive patchy and confluent hypodensity involving the supratentorial cerebral white matter, consistent with chronic small vessel ischemic disease, advanced in nature. Superimposed remote lacunar infarcts present about the deep gray nuclei. Few small remote right cerebellar infarcts. No acute large vessel territory infarct. No acute intracranial hemorrhage. No mass lesion or midline shift. No hydrocephalus or extra-axial fluid collection. Vascular: No abnormal hyperdense vessel. Scattered vascular calcifications noted within the carotid siphons. Skull: Scalp soft tissues within normal limits.  Calvarium intact. Sinuses/Orbits: Globes and orbital soft tissues demonstrate no acute finding. Paranasal sinuses are largely clear. No significant mastoid effusion. Other: None. ASPECTS [facility] Stroke Program Early CT Score) - Ganglionic level infarction (caudate, lentiform nuclei, internal capsule, insula, M1-M3 cortex): 7 - Supraganglionic infarction (M4-M6 cortex): 3 Total score (0-10 with 10 being normal): 10 IMPRESSION: 1. No acute intracranial abnormality. 2. ASPECTS is 10. 3. Advanced chronic microvascular ischemic disease, with a few small remote right cerebellar infarcts. These results were communicated to Dr. Otelia Limes at 9:04 pm on 09/14/2023 by text page via the [facility] messaging system. Electronically Signed   By: Rise Mu M.D.   On: 09/14/2023 21:06     Labs:   Basic Metabolic  Panel: Recent Labs  Lab 09/14/23 2054 09/14/23 2100 09/15/23 0555 09/16/23 0449  NA 136 137 135 138  K 3.5 3.4* 4.3 4.1  CL 104 103 100 104  CO2  --  20* 25 22  GLUCOSE 156* 154* 93 90  BUN 24* 20 18 14   CREATININE 1.10* 1.22* 0.97 0.71  CALCIUM  --  10.5* 10.4* 9.8  MG  --  1.9 1.9 1.8  PHOS  --   --  5.0*  --    GFR Estimated Creatinine Clearance: 56 mL/min (by C-G formula based on SCr of 0.71 mg/dL). Liver Function Tests: Recent Labs  Lab 09/14/23 2100 09/15/23 0555  AST 23 23  ALT 12 14  ALKPHOS 70 77  BILITOT 0.8 0.7  PROT 7.0 7.4  ALBUMIN 3.4* 3.4*   No results for input(s): "LIPASE", "AMYLASE" in the last 168 hours. Recent Labs  Lab 09/15/23 1122  AMMONIA 31   Coagulation profile Recent Labs  Lab 09/15/23 0555  INR 1.1    CBC: Recent Labs  Lab 09/14/23 2054 09/14/23 2100 09/15/23 0555 09/16/23 0449  WBC  --  11.3* 11.8* 7.9  NEUTROABS  --  8.3* 8.5*  --   HGB 14.6 13.6 14.0 11.7*  HCT 43.0  43.3 43.1 35.5*  MCV  --  93.9 91.5 90.8  PLT  --  274 305 254   Cardiac Enzymes: Recent Labs  Lab 09/15/23 0555  CKTOTAL 84   BNP: Invalid input(s): "POCBNP" CBG: No results for input(s): "GLUCAP" in the last 168 hours. D-Dimer No results for input(s): "DDIMER" in the last 72 hours. Hgb A1c Recent Labs    09/15/23 0555  HGBA1C 5.8*   Lipid Profile Recent Labs    09/15/23 0555  CHOL 230*  HDL 34*  LDLCALC 178*  TRIG 89  CHOLHDL 6.8   Thyroid function studies Recent Labs    09/15/23 0555  TSH 1.489   Anemia work up Recent Labs    09/15/23 0555  VITAMINB12 1,687*   Microbiology No results found for this or any previous visit (from the past 240 hour(s)).  Time coordinating discharge: 25 minutes  Signed: Connelly Spruell  [facility] 09/16/2023, 11:19 AM

## 2023-09-16 NOTE — Plan of Care (Signed)
  Problem: Education: Goal: Knowledge of General Education information will improve Description: Including pain rating scale, medication(s)/side effects and non-pharmacologic comfort measures 09/16/2023 1127 by Beryle Flock, RN Outcome: Adequate for Discharge 09/16/2023 1028 by Beryle Flock, RN Outcome: Progressing

## 2023-09-17 ENCOUNTER — Telehealth: Payer: Self-pay

## 2023-09-17 NOTE — Transitions of Care (Post Inpatient/ED Visit) (Signed)
   09/17/2023  Name: Sheila Perez MRN: [id number] DOB: 12-11-1945  Today's TOC FU Call Status: Today's TOC FU Call Status:: Unsuccessful Call (1st Attempt) Unsuccessful Call (1st Attempt) Date: 09/17/23  Attempted to reach the patient regarding the most recent Inpatient/ED visit.  Follow Up Plan: Additional outreach attempts will be made to reach the patient to complete the Transitions of Care (Post Inpatient/ED visit) call.      Antionette Fairy, RN,BSN,CCM RN Care Manager Transitions of Care  -[facility]/Population Health  Direct Phone: [phone] Toll Free: [phone] Fax: [phone]

## 2023-09-18 ENCOUNTER — Telehealth: Payer: Self-pay

## 2023-09-18 NOTE — Transitions of Care (Post Inpatient/ED Visit) (Signed)
   09/18/2023  Name: Loie Jahr MRN: [id number] DOB: 07-13-1946  Today's TOC FU Call Status: Today's TOC FU Call Status:: Unsuccessful Call (3rd Attempt) Unsuccessful Call (3rd Attempt) Date: 09/18/23  Attempted to reach the patient regarding the most recent Inpatient/ED visit.  Follow Up Plan: No further outreach attempts will be made at this time. We have been unable to contact the patient.    Antionette Fairy, RN,BSN,CCM RN Care Manager Transitions of Care  [place]-[facility]/Population Health  Direct Phone: [phone] Toll Free: [phone] Fax: [phone]

## 2023-09-18 NOTE — Transitions of Care (Post Inpatient/ED Visit) (Signed)
   09/18/2023  Name: Sheila Perez MRN: [id number] DOB: January 18, 1946  Today's TOC FU Call Status: Today's TOC FU Call Status:: Unsuccessful Call (2nd Attempt) Unsuccessful Call (2nd Attempt) Date: 09/18/23  Attempted to reach the patient regarding the most recent Inpatient/ED visit.  Follow Up Plan: Additional outreach attempts will be made to reach the patient to complete the Transitions of Care (Post Inpatient/ED visit) call.     Antionette Fairy, RN,BSN,CCM RN Care Manager Transitions of Care  [place]-[facility]BC[facility]/Population Health  Direct Phone: [phone] Toll Free: [phone] Fax: [phone]

## 2023-09-30 ENCOUNTER — Emergency Department (HOSPITAL_COMMUNITY)
Admission: EM | Admit: 2023-09-30 | Discharge: 2023-09-30 | Disposition: A | Discharge status: Home / Self Care | Attending: Emergency Medicine | Admitting: Emergency Medicine

## 2023-09-30 ENCOUNTER — Emergency Department (HOSPITAL_COMMUNITY)

## 2023-09-30 DIAGNOSIS — E119 Type 2 diabetes mellitus without complications: Secondary | ICD-10-CM | POA: Diagnosis not present

## 2023-09-30 DIAGNOSIS — M7989 Other specified soft tissue disorders: Secondary | ICD-10-CM | POA: Insufficient documentation

## 2023-09-30 DIAGNOSIS — S42302A Unspecified fracture of shaft of humerus, left arm, initial encounter for closed fracture: Secondary | ICD-10-CM | POA: Diagnosis not present

## 2023-09-30 DIAGNOSIS — M84522A Pathological fracture in neoplastic disease, left humerus, initial encounter for fracture: Secondary | ICD-10-CM | POA: Insufficient documentation

## 2023-09-30 DIAGNOSIS — I10 Essential (primary) hypertension: Secondary | ICD-10-CM | POA: Insufficient documentation

## 2023-09-30 DIAGNOSIS — X501XXA Overexertion from prolonged static or awkward postures, initial encounter: Secondary | ICD-10-CM | POA: Diagnosis not present

## 2023-09-30 DIAGNOSIS — C50919 Malignant neoplasm of unspecified site of unspecified female breast: Secondary | ICD-10-CM | POA: Diagnosis not present

## 2023-09-30 DIAGNOSIS — Z7401 Bed confinement status: Secondary | ICD-10-CM | POA: Diagnosis not present

## 2023-09-30 DIAGNOSIS — S4992XA Unspecified injury of left shoulder and upper arm, initial encounter: Secondary | ICD-10-CM | POA: Diagnosis present

## 2023-09-30 DIAGNOSIS — R6889 Other general symptoms and signs: Secondary | ICD-10-CM | POA: Diagnosis not present

## 2023-09-30 DIAGNOSIS — R404 Transient alteration of awareness: Secondary | ICD-10-CM | POA: Diagnosis not present

## 2023-09-30 DIAGNOSIS — S42392A Other fracture of shaft of left humerus, initial encounter for closed fracture: Secondary | ICD-10-CM | POA: Diagnosis not present

## 2023-09-30 NOTE — ED Triage Notes (Addendum)
PT BIB [facility] for Left upper arm pain with deformity.  PT was being moved from bed to chair by family and they heard and felt a pop. PT isnt really talking.  PER EMs family states pt is at baseline mentation.  Family is coming.   She is in hospice care for cancer.  She gets morphine and oxycodone for pain at home.  140 palp (l. Fa) 987% HR 102, RR 16

## 2023-09-30 NOTE — Progress Notes (Signed)
[facility]   Hospice liaison note     This patient is a current hospice patient with [facility].    Liaison will continue to follow for any discharge planning needs and to coordinate continuation of hospice care.    Please don't hesitate to call with any Hospice related questions or concerns.    Thank you for the opportunity to participate in this patient's care.  Glenna Fellows, BSN, RN, OCN [employer] [phone]

## 2023-09-30 NOTE — Discharge Instructions (Addendum)
Pathological fracture of the left humerus.  Keep the sling in place.  Follow-up with [facility].  Continue current pain control.

## 2023-09-30 NOTE — ED Provider Triage Note (Signed)
Emergency Medicine Provider Triage Evaluation Note  Sheila Perez , a 77 y.o. female  was evaluated in triage.  Pt complains of deformity of left upper extremity.  Family was moving her in bed today, she is palliative care, history of breast cancer, they felt a snap and saw an obvious deformity of her left upper extremity.  Any pain with movement at this time..  Review of Systems  Positive: Deformity, fracture Negative: Head injury, loss of consciousness, fall  Physical Exam  BP (!) 181/101 (BP Location: Right Arm)   Pulse 98   Temp 98.9 F (37.2 C) (Oral)   Resp 18   SpO2 96%  Gen:   Awake, no distress   Resp:  Normal effort  MSK:   Moves extremities without difficulty, deformity noted of left upper extremity at the humerus,  other:  Radial, ulnar pulses are 2+ in the affected left upper extremity  Medical Decision Making  Medically screening exam initiated at 12:57 PM.  Appropriate orders placed.  RUEAVWUJ Merlin Dilullo was informed that the remainder of the evaluation will be completed by another provider, this initial triage assessment does not replace that evaluation, and the importance of remaining in the ED until their evaluation is complete.  Workup initiated in triage    Olene Floss, PA-C 09/30/23 1257

## 2023-09-30 NOTE — ED Provider Notes (Signed)
[facility] AT [facility] Provider Note   CSN: [id number] Arrival date & time: 09/30/23  1244     History  Chief Complaint  Patient presents with   Arm Injury    [id number] Leonie Attar is a 77 y.o. female.  Patient under hospice care.  Patient known to have metastatic breast cancer.  Patient had recent admission beginning of November November 1 to November 3.  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  Today when they were transferring her they heard a pop in her left arm.  No deformity developed.  Brought in by EMS.  Patient has not been ambulatory significantly for a while.  Patient is receiving no care.  Patient is under hospice care.  Does have pain control at home.  Family was told by oncology that there was no intervention.  Patient's mental status is currently baseline.  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  Patient's had abdominal hysterectomy.  Also no other concerns for any injuries.       Home Medications Prior to Admission medications   Medication Sig Start Date End Date Taking? Authorizing Provider  cholecalciferol (VITAMIN D3) 25 MCG (1000 UNIT) tablet Take 1,000 Units by mouth daily.    [provider]  Morphine Sulfate (MORPHINE CONCENTRATE) 10 mg / 0.5 ml concentrated solution Take 20 mg by mouth. 08/28/23   [provider]  OVER THE COUNTER MEDICATION Take 1 capsule by mouth 2 (two) times daily. [place] tail mushrooms    [provider]  oxyCODONE (OXY IR/ROXICODONE) 5 MG immediate release tablet Take 5 mg by mouth every 4 (four) hours as needed. 08/06/23   [provider]  sorbitol 70 % SOLN Take 30 mLs by mouth daily as needed for mild constipation or moderate constipation. 07/21/23   [provider]  STIMULANT LAXATIVE 8.6-50 MG tablet Take 2 tablets by mouth 2 (two) times daily. 06/01/23    [provider]      Allergies    Patient has no known allergies.    Review of Systems   Review of Systems  Unable to perform ROS: Mental status change    Physical Exam Updated Vital Signs BP (S) (!) 203/88 Comment: has not taken BP meds in a few days  Pulse 98   Temp 97.9 F (36.6 C)   Resp 18   SpO2 100%  Physical Exam Vitals and nursing note reviewed.  Constitutional:      General: She is not in acute distress.    Appearance: She is well-developed. She is ill-appearing.  HENT:     Head: Normocephalic and atraumatic.     Mouth/Throat:     Mouth: Mucous membranes are moist.  Eyes:     Conjunctiva/sclera: Conjunctivae normal.  Cardiovascular:     Rate and Rhythm: Normal rate and regular rhythm.     Heart sounds: No murmur heard. Pulmonary:     Effort: Pulmonary effort is normal. No respiratory distress.     Breath sounds: Normal breath sounds.  Abdominal:     Palpations: Abdomen is soft.     Tenderness: There is no abdominal tenderness.  Musculoskeletal:        General: Deformity present. No swelling.     Cervical back: Neck supple.     Comments: Deformity to left humerus area.  About midshaft.  Distally radial pulses 2+.  Will move fingers.  Hand  is warm.  Skin:    General: Skin is warm and dry.     Capillary Refill: Capillary refill takes less than 2 seconds.  Neurological:     Mental Status: She is alert. Mental status is at baseline.  Psychiatric:        Mood and Affect: Mood normal.     ED Results / Procedures / Treatments   Labs (all labs ordered are listed, but only abnormal results are displayed) Labs Reviewed - No data to display  EKG None  Radiology DG Humerus Left  Result Date: 09/30/2023 CLINICAL DATA:  Left arm deformity.  History of breast cancer EXAM: LEFT HUMERUS - 2+ VIEW COMPARISON:  Shoulder x-ray 03/17/2022 FINDINGS: Acute pathologic fracture through a permeative lytic lesion in the mid to distal left humeral diaphysis.  Anterior apex angulation and mild displacement at the fracture site. Additional 3.3 cm lytic lesion more distally within the left humeral diaphysis. Alignment of the shoulder and elbow are maintained. There is a rounded 1.9 cm area of increased density within the posterolateral subcutaneous soft tissues of the mid left upper arm, possibly a small hematoma or subcutaneous mass. IMPRESSION: 1. Acute pathologic fracture through a permeative lytic lesion in the mid to distal left humeral diaphysis. 2. Additional 3.3 cm lytic lesion more distally within the left humeral diaphysis. Findings most likely related to metastatic disease in the setting of known breast cancer. 3. Rounded 1.9 cm area of increased density within the posterolateral subcutaneous soft tissues of the mid left upper arm, possibly a small hematoma or subcutaneous mass. Electronically Signed   By: Duanne Guess D.O.   On: 09/30/2023 14:56    Procedures Procedures    Medications Ordered in ED Medications - No data to display  ED Course/ Medical Decision Making/ A&P                                 Medical Decision Making  Left humerus x-ray shows evidence of an acute pathological fracture through a lytic lesion in the mid to distal left humerus diaphysis.  Additionally there is a 3.3 cm lytic lesion more distally within the left humeral diaphysis.  Discussed with Dr. Constance Goltz on-call for [facility] patient is not followed by Ortho currently.  He is recommending sling.  They can see the patient in the office follow-up would be by Dr. Debby Bud or Dr. Aundria Rud.  He did not recommend a splint.  York Spaniel it would be very uncomfortable for her.  So we put her in a shoulder immobilizer.  She is tolerating the arm bent at 90 degrees.  Patient has not required any pain medicine here.   Final Clinical Impression(s) / ED Diagnoses Final diagnoses:  Closed fracture of shaft of left humerus, unspecified fracture morphology, initial encounter   Malignant neoplasm of female breast, unspecified estrogen receptor status, unspecified laterality, unspecified site of breast (HCC)  Pathological fracture in neoplastic disease, left humerus, initial encounter for fracture    Rx / DC Orders ED Discharge Orders     None         Vanetta Mulders, MD 09/30/23 [phone]

## 2023-09-30 NOTE — ED Notes (Signed)
Ortho paged for shoulder immobilzer/sling

## 2023-09-30 NOTE — Progress Notes (Signed)
Orthopedic Tech Progress Note Patient Details:  [name] [birth year] [id number]  Shoulder immobilizer was placed to LUE. EDP expressed he did not want a splint to be placed per instruction from Dr. Charlann Boxer when he was consulted.   Ortho Devices Type of Ortho Device: Sling immobilizer Ortho Device/Splint Location: LUE Ortho Device/Splint Interventions: Ordered, Application, Adjustment   Post Interventions Patient Tolerated: Fair Instructions Provided: Care of device, Adjustment of device  Shreyan Hinz Carmine Savoy 09/30/2023, 5:31 PM

## 2023-09-30 NOTE — ED Notes (Signed)
Pt Bp was in the 200's systolically and in the 100's dystolically, Dr Deretha Emory was made aware. He is aware [facility] is here to get the patient. Per MD it is ok for her to go.

## 2023-10-14 DEATH — deceased
